# Patient Record
Sex: Male | Born: 1960
Health system: Southern US, Community
[De-identification: ages and names within clinical notes are randomized; demographics above are authoritative.]

## PROBLEM LIST (undated history)

## (undated) DIAGNOSIS — M479 Spondylosis, unspecified: Secondary | ICD-10-CM

## (undated) DIAGNOSIS — H7290 Unspecified perforation of tympanic membrane, unspecified ear: Secondary | ICD-10-CM

## (undated) DIAGNOSIS — R5383 Other fatigue: Secondary | ICD-10-CM

## (undated) DIAGNOSIS — M5030 Other cervical disc degeneration, unspecified cervical region: Secondary | ICD-10-CM

## (undated) DIAGNOSIS — K219 Gastro-esophageal reflux disease without esophagitis: Secondary | ICD-10-CM

## (undated) DIAGNOSIS — J309 Allergic rhinitis, unspecified: Secondary | ICD-10-CM

## (undated) DIAGNOSIS — H8109 Meniere's disease, unspecified ear: Secondary | ICD-10-CM

## (undated) DIAGNOSIS — R1011 Right upper quadrant pain: Secondary | ICD-10-CM

## (undated) DIAGNOSIS — E785 Hyperlipidemia, unspecified: Secondary | ICD-10-CM

## (undated) DIAGNOSIS — T7840XA Allergy, unspecified, initial encounter: Secondary | ICD-10-CM

## (undated) DIAGNOSIS — G4730 Sleep apnea, unspecified: Secondary | ICD-10-CM

## (undated) DIAGNOSIS — I959 Hypotension, unspecified: Secondary | ICD-10-CM

## (undated) DIAGNOSIS — E7800 Pure hypercholesterolemia, unspecified: Secondary | ICD-10-CM

## (undated) DIAGNOSIS — H6690 Otitis media, unspecified, unspecified ear: Secondary | ICD-10-CM

## (undated) DIAGNOSIS — Z9189 Other specified personal risk factors, not elsewhere classified: Secondary | ICD-10-CM

## (undated) DIAGNOSIS — R5381 Other malaise: Secondary | ICD-10-CM

## (undated) DIAGNOSIS — G4710 Hypersomnia, unspecified: Secondary | ICD-10-CM

## (undated) DIAGNOSIS — I10 Essential (primary) hypertension: Secondary | ICD-10-CM

## (undated) DIAGNOSIS — D376 Neoplasm of uncertain behavior of liver, gallbladder and bile ducts: Secondary | ICD-10-CM

## (undated) HISTORY — DX: Meniere's disease, unspecified ear: H81.09

## (undated) HISTORY — DX: Essential (primary) hypertension: I10

## (undated) HISTORY — DX: Hyperlipidemia, unspecified: E78.5

## (undated) HISTORY — DX: Spondylosis, unspecified: M47.9

## (undated) HISTORY — DX: Pure hypercholesterolemia, unspecified: E78.00

## (undated) HISTORY — DX: Allergy, unspecified, initial encounter: T78.40XA

## (undated) HISTORY — DX: Other cervical disc degeneration, unspecified cervical region: M50.30

## (undated) HISTORY — DX: Other specified personal risk factors, not elsewhere classified: Z91.89

## (undated) HISTORY — DX: Hypotension, unspecified: I95.9

## (undated) HISTORY — DX: Allergic rhinitis, unspecified: J30.9

## (undated) HISTORY — DX: Neoplasm of uncertain behavior of liver, gallbladder and bile ducts: D37.6

## (undated) HISTORY — DX: Other malaise: R53.81

## (undated) HISTORY — DX: Right upper quadrant pain: R10.11

## (undated) HISTORY — PX: OTHER SURGICAL HISTORY: SHX169

## (undated) HISTORY — DX: Sleep apnea, unspecified: G47.30

## (undated) HISTORY — DX: Gastro-esophageal reflux disease without esophagitis: K21.9

## (undated) HISTORY — DX: Otitis media, unspecified, unspecified ear: H66.90

## (undated) HISTORY — DX: Unspecified perforation of tympanic membrane, unspecified ear: H72.90

## (undated) HISTORY — DX: Hypersomnia, unspecified: G47.10

## (undated) HISTORY — PX: COLONOSCOPY: SHX174

## (undated) HISTORY — DX: Other fatigue: R53.83

---

## 2000-10-29 ENCOUNTER — Encounter: Payer: Self-pay | Admitting: Internal Medicine

## 2000-10-29 ENCOUNTER — Ambulatory Visit (HOSPITAL_COMMUNITY): Admission: RE | Admit: 2000-10-29 | Discharge: 2000-10-29 | Payer: Self-pay | Admitting: Internal Medicine

## 2005-01-10 ENCOUNTER — Ambulatory Visit: Payer: Self-pay | Admitting: Internal Medicine

## 2005-01-21 ENCOUNTER — Ambulatory Visit: Payer: Self-pay | Admitting: Internal Medicine

## 2005-04-19 ENCOUNTER — Ambulatory Visit: Payer: Self-pay | Admitting: Internal Medicine

## 2005-05-17 ENCOUNTER — Encounter: Admission: RE | Admit: 2005-05-17 | Discharge: 2005-05-17 | Payer: Self-pay | Admitting: Otolaryngology

## 2005-05-19 ENCOUNTER — Encounter: Admission: RE | Admit: 2005-05-19 | Discharge: 2005-05-19 | Payer: Self-pay | Admitting: Otolaryngology

## 2005-08-23 ENCOUNTER — Ambulatory Visit: Payer: Self-pay | Admitting: Internal Medicine

## 2006-12-23 ENCOUNTER — Encounter: Payer: Self-pay | Admitting: Internal Medicine

## 2006-12-23 DIAGNOSIS — Z9189 Other specified personal risk factors, not elsewhere classified: Secondary | ICD-10-CM | POA: Insufficient documentation

## 2006-12-23 DIAGNOSIS — H7290 Unspecified perforation of tympanic membrane, unspecified ear: Secondary | ICD-10-CM

## 2006-12-23 DIAGNOSIS — M479 Spondylosis, unspecified: Secondary | ICD-10-CM | POA: Insufficient documentation

## 2006-12-23 DIAGNOSIS — K219 Gastro-esophageal reflux disease without esophagitis: Secondary | ICD-10-CM | POA: Insufficient documentation

## 2006-12-23 DIAGNOSIS — M5030 Other cervical disc degeneration, unspecified cervical region: Secondary | ICD-10-CM

## 2006-12-23 DIAGNOSIS — H8109 Meniere's disease, unspecified ear: Secondary | ICD-10-CM | POA: Insufficient documentation

## 2006-12-23 DIAGNOSIS — E7800 Pure hypercholesterolemia, unspecified: Secondary | ICD-10-CM | POA: Insufficient documentation

## 2006-12-23 DIAGNOSIS — M47812 Spondylosis without myelopathy or radiculopathy, cervical region: Secondary | ICD-10-CM | POA: Insufficient documentation

## 2006-12-23 HISTORY — DX: Other specified personal risk factors, not elsewhere classified: Z91.89

## 2006-12-23 HISTORY — DX: Unspecified perforation of tympanic membrane, unspecified ear: H72.90

## 2006-12-23 HISTORY — DX: Other cervical disc degeneration, unspecified cervical region: M50.30

## 2006-12-23 HISTORY — DX: Spondylosis, unspecified: M47.9

## 2006-12-23 HISTORY — DX: Pure hypercholesterolemia, unspecified: E78.00

## 2006-12-23 HISTORY — DX: Gastro-esophageal reflux disease without esophagitis: K21.9

## 2006-12-23 HISTORY — DX: Meniere's disease, unspecified ear: H81.09

## 2007-03-17 ENCOUNTER — Ambulatory Visit: Payer: Self-pay | Admitting: Internal Medicine

## 2007-03-17 LAB — CONVERTED CEMR LAB
ALT: 23 units/L (ref 0–53)
AST: 20 units/L (ref 0–37)
Albumin: 3.8 g/dL (ref 3.5–5.2)
Alkaline Phosphatase: 53 units/L (ref 39–117)
BUN: 9 mg/dL (ref 6–23)
Basophils Absolute: 0 10*3/uL (ref 0.0–0.1)
Basophils Relative: 0.2 % (ref 0.0–1.0)
Bilirubin Urine: NEGATIVE
Bilirubin, Direct: 0.1 mg/dL (ref 0.0–0.3)
CO2: 27 meq/L (ref 19–32)
Calcium: 9.3 mg/dL (ref 8.4–10.5)
Chloride: 104 meq/L (ref 96–112)
Cholesterol: 209 mg/dL (ref 0–200)
Creatinine, Ser: 1 mg/dL (ref 0.4–1.5)
Direct LDL: 121 mg/dL
Eosinophils Absolute: 0.2 10*3/uL (ref 0.0–0.6)
Eosinophils Relative: 3.1 % (ref 0.0–5.0)
GFR calc Af Amer: 104 mL/min
GFR calc non Af Amer: 86 mL/min
Glucose, Bld: 87 mg/dL (ref 70–99)
HCT: 48.8 % (ref 39.0–52.0)
HDL: 52.9 mg/dL (ref >39.0)
Hemoglobin, Urine: NEGATIVE
Hemoglobin: 17.1 g/dL — ABNORMAL HIGH (ref 13.0–17.0)
Ketones, ur: NEGATIVE mg/dL
Leukocytes, UA: NEGATIVE
Lymphocytes Relative: 34.5 % (ref 12.0–46.0)
MCHC: 35.1 g/dL (ref 30.0–36.0)
MCV: 90.6 fL (ref 78.0–100.0)
Monocytes Absolute: 0.7 10*3/uL (ref 0.2–0.7)
Monocytes Relative: 11.7 % — ABNORMAL HIGH (ref 3.0–11.0)
Neutro Abs: 3.2 10*3/uL (ref 1.4–7.7)
Neutrophils Relative %: 50.5 % (ref 43.0–77.0)
Nitrite: NEGATIVE
PSA: 0.92 ng/mL (ref 0.10–4.00)
Platelets: 213 10*3/uL (ref 150–400)
Potassium: 3.9 meq/L (ref 3.5–5.1)
RBC: 5.38 M/uL (ref 4.22–5.81)
RDW: 12.7 % (ref 11.5–14.6)
Sodium: 136 meq/L (ref 135–145)
Specific Gravity, Urine: 1.02 (ref 1.000–1.03)
TSH: 3.4 microintl units/mL (ref 0.35–5.50)
Total Bilirubin: 1.1 mg/dL (ref 0.3–1.2)
Total CHOL/HDL Ratio: 4
Total Protein, Urine: NEGATIVE mg/dL
Total Protein: 7.1 g/dL (ref 6.0–8.3)
Triglycerides: 166 mg/dL — ABNORMAL HIGH (ref 0–149)
Urine Glucose: NEGATIVE mg/dL
Urobilinogen, UA: 0.2 (ref 0.0–1.0)
VLDL: 33 mg/dL (ref 0–40)
WBC: 6.3 10*3/uL (ref 4.5–10.5)
pH: 6 (ref 5.0–8.0)

## 2007-03-19 ENCOUNTER — Ambulatory Visit: Payer: Self-pay | Admitting: Internal Medicine

## 2007-03-19 DIAGNOSIS — I10 Essential (primary) hypertension: Secondary | ICD-10-CM

## 2007-03-19 DIAGNOSIS — J309 Allergic rhinitis, unspecified: Secondary | ICD-10-CM

## 2007-03-19 DIAGNOSIS — J302 Other seasonal allergic rhinitis: Secondary | ICD-10-CM | POA: Insufficient documentation

## 2007-03-19 DIAGNOSIS — E785 Hyperlipidemia, unspecified: Secondary | ICD-10-CM | POA: Insufficient documentation

## 2007-03-19 HISTORY — DX: Essential (primary) hypertension: I10

## 2007-03-19 HISTORY — DX: Hyperlipidemia, unspecified: E78.5

## 2007-03-19 HISTORY — DX: Allergic rhinitis, unspecified: J30.9

## 2007-09-30 ENCOUNTER — Telehealth (INDEPENDENT_AMBULATORY_CARE_PROVIDER_SITE_OTHER): Payer: Self-pay | Admitting: *Deleted

## 2007-12-09 ENCOUNTER — Ambulatory Visit: Payer: Self-pay | Admitting: Internal Medicine

## 2007-12-09 DIAGNOSIS — R1011 Right upper quadrant pain: Secondary | ICD-10-CM | POA: Insufficient documentation

## 2007-12-09 HISTORY — DX: Right upper quadrant pain: R10.11

## 2007-12-09 LAB — CONVERTED CEMR LAB
ALT: 20 units/L (ref 0–53)
AST: 17 units/L (ref 0–37)
Albumin: 3.9 g/dL (ref 3.5–5.2)
Alkaline Phosphatase: 53 units/L (ref 39–117)
BUN: 14 mg/dL (ref 6–23)
Basophils Absolute: 0.1 10*3/uL (ref 0.0–0.1)
Basophils Relative: 1.1 % (ref 0.0–3.0)
Bilirubin, Direct: 0.1 mg/dL (ref 0.0–0.3)
CO2: 29 meq/L (ref 19–32)
Calcium: 9.4 mg/dL (ref 8.4–10.5)
Chloride: 98 meq/L (ref 96–112)
Creatinine, Ser: 1 mg/dL (ref 0.4–1.5)
Eosinophils Absolute: 0.2 10*3/uL (ref 0.0–0.7)
Eosinophils Relative: 2.5 % (ref 0.0–5.0)
GFR calc Af Amer: 104 mL/min
GFR calc non Af Amer: 86 mL/min
Glucose, Bld: 103 mg/dL — ABNORMAL HIGH (ref 70–99)
HCT: 48.1 % (ref 39.0–52.0)
Hemoglobin: 16.7 g/dL (ref 13.0–17.0)
Lipase: 28 units/L (ref 11.0–59.0)
Lymphocytes Relative: 30.6 % (ref 12.0–46.0)
MCHC: 34.6 g/dL (ref 30.0–36.0)
MCV: 90.4 fL (ref 78.0–100.0)
Monocytes Absolute: 0.7 10*3/uL (ref 0.1–1.0)
Monocytes Relative: 10.7 % (ref 3.0–12.0)
Neutro Abs: 3.7 10*3/uL (ref 1.4–7.7)
Neutrophils Relative %: 55.1 % (ref 43.0–77.0)
Platelets: 255 10*3/uL (ref 150–400)
Potassium: 4.6 meq/L (ref 3.5–5.1)
RBC: 5.32 M/uL (ref 4.22–5.81)
RDW: 12.5 % (ref 11.5–14.6)
Sodium: 136 meq/L (ref 135–145)
Total Bilirubin: 0.8 mg/dL (ref 0.3–1.2)
Total Protein: 7.4 g/dL (ref 6.0–8.3)
WBC: 6.8 10*3/uL (ref 4.5–10.5)

## 2007-12-11 ENCOUNTER — Encounter: Payer: Self-pay | Admitting: Internal Medicine

## 2007-12-11 ENCOUNTER — Encounter: Admission: RE | Admit: 2007-12-11 | Discharge: 2007-12-11 | Payer: Self-pay | Admitting: Internal Medicine

## 2007-12-11 DIAGNOSIS — D376 Neoplasm of uncertain behavior of liver, gallbladder and bile ducts: Secondary | ICD-10-CM | POA: Insufficient documentation

## 2007-12-11 HISTORY — DX: Neoplasm of uncertain behavior of liver, gallbladder and bile ducts: D37.6

## 2007-12-16 ENCOUNTER — Ambulatory Visit: Payer: Self-pay | Admitting: Cardiology

## 2008-04-13 ENCOUNTER — Telehealth (INDEPENDENT_AMBULATORY_CARE_PROVIDER_SITE_OTHER): Payer: Self-pay | Admitting: *Deleted

## 2008-10-20 ENCOUNTER — Telehealth: Payer: Self-pay | Admitting: Internal Medicine

## 2008-11-18 ENCOUNTER — Telehealth: Payer: Self-pay | Admitting: Internal Medicine

## 2009-01-18 ENCOUNTER — Telehealth: Payer: Self-pay | Admitting: Internal Medicine

## 2009-04-13 ENCOUNTER — Ambulatory Visit: Payer: Self-pay | Admitting: Internal Medicine

## 2009-04-13 DIAGNOSIS — H6690 Otitis media, unspecified, unspecified ear: Secondary | ICD-10-CM

## 2009-04-13 DIAGNOSIS — I959 Hypotension, unspecified: Secondary | ICD-10-CM

## 2009-04-13 HISTORY — DX: Otitis media, unspecified, unspecified ear: H66.90

## 2009-04-13 HISTORY — DX: Hypotension, unspecified: I95.9

## 2009-04-14 LAB — CONVERTED CEMR LAB
Alkaline Phosphatase: 52 units/L (ref 39–117)
BUN: 13 mg/dL (ref 6–23)
Basophils Absolute: 0 10*3/uL (ref 0.0–0.1)
Basophils Relative: 0.3 % (ref 0.0–3.0)
Bilirubin, Direct: 0.1 mg/dL (ref 0.0–0.3)
Chloride: 101 meq/L (ref 96–112)
Cholesterol: 169 mg/dL (ref 0–200)
Eosinophils Absolute: 0 10*3/uL (ref 0.0–0.7)
Glucose, Bld: 124 mg/dL — ABNORMAL HIGH (ref 70–99)
Hgb A1c MFr Bld: 5.3 % (ref 4.6–6.5)
LDL Cholesterol: 99 mg/dL (ref 0–99)
Lymphocytes Relative: 5.8 % — ABNORMAL LOW (ref 12.0–46.0)
MCHC: 34 g/dL (ref 30.0–36.0)
Neutrophils Relative %: 84.4 % — ABNORMAL HIGH (ref 43.0–77.0)
Nitrite: NEGATIVE
Potassium: 3.9 meq/L (ref 3.5–5.1)
RBC: 4.63 M/uL (ref 4.22–5.81)
Specific Gravity, Urine: 1.025 (ref 1.000–1.030)
Total Bilirubin: 0.8 mg/dL (ref 0.3–1.2)
Total Protein, Urine: 30 mg/dL
Total Protein: 7.4 g/dL (ref 6.0–8.3)
VLDL: 13.8 mg/dL (ref 0.0–40.0)
WBC: 4.5 10*3/uL (ref 4.5–10.5)
pH: 6 (ref 5.0–8.0)

## 2009-04-19 ENCOUNTER — Telehealth: Payer: Self-pay | Admitting: Internal Medicine

## 2009-04-28 ENCOUNTER — Ambulatory Visit: Payer: Self-pay | Admitting: Internal Medicine

## 2009-04-28 DIAGNOSIS — R5383 Other fatigue: Secondary | ICD-10-CM

## 2009-04-28 DIAGNOSIS — G4710 Hypersomnia, unspecified: Secondary | ICD-10-CM

## 2009-04-28 DIAGNOSIS — R5381 Other malaise: Secondary | ICD-10-CM | POA: Insufficient documentation

## 2009-04-28 HISTORY — DX: Hypersomnia, unspecified: G47.10

## 2009-04-28 HISTORY — DX: Other malaise: R53.81

## 2009-05-03 ENCOUNTER — Telehealth: Payer: Self-pay | Admitting: Internal Medicine

## 2009-10-20 ENCOUNTER — Telehealth: Payer: Self-pay | Admitting: Internal Medicine

## 2010-02-16 ENCOUNTER — Telehealth (INDEPENDENT_AMBULATORY_CARE_PROVIDER_SITE_OTHER): Payer: Self-pay | Admitting: *Deleted
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
## 2010-04-10 NOTE — Progress Notes (Signed)
----   Converted from flag ---- ---- 04/29/2009 9:55 PM, Corwin Levins MD wrote: please contact pt;  I am not sure if he is still taking the micardis or not;  just need to check to see if he is taking it at his last visit  thanks ------------------------------  Called pt and he stated he is taking the Micardis      New/Updated Medications: MICARDIS 80 MG TABS (TELMISARTAN) 1 by mouth once daily  noted; this was apparently an error when micardis removed at last office visit  will place back on list  Corwin Levins MD  May 03, 2009 12:44 PM

## 2010-04-10 NOTE — Progress Notes (Signed)
Summary: Medication refill  Phone Note Refill Request Message from:  Fax from Pharmacy on October 20, 2009 4:12 PM  Refills Requested: Medication #1:  AMBIEN 10 MG  TABS 1 by mouth at bedtime as needed   Dosage confirmed as above?Dosage Confirmed   Last Refilled: 04/19/2009   Notes: [facility] [address], [phone] Initial call taken by: Robin Ewing CMA Duncan Dull),  October 20, 2009 4:12 PM    Prescriptions: AMBIEN 10 MG  TABS (ZOLPIDEM TARTRATE) 1 by mouth at bedtime as needed  #30 x 5   Entered and Authorized by:   Corwin Levins MD   Signed by:   Corwin Levins MD on 10/20/2009   Method used:   Print then Give to Patient   RxID:   [id number]  done hardcopy to [facility] side B - dahlia  Corwin Levins MD  October 20, 2009 4:14 PM   Rx faxed to pharmacy Margaret Pyle, CMA  October 20, 2009 4:16 PM

## 2010-04-10 NOTE — Progress Notes (Signed)
Summary: Med Refill  Phone Note Refill Request  on April 19, 2009 11:45 AM  Refills Requested: Medication #1:  AMBIEN 10 MG  TABS 1 by mouth at bedtime as needed  -  needs return office visit for further refills   Dosage confirmed as above?Dosage Confirmed   Last Refilled: 01/2009   Notes: [facility] [address], [phone] Initial call taken by: Scharlene Gloss,  April 19, 2009 11:46 AM  Follow-up for Phone Call        done hardcopy to [facility] side B - dahlia  Follow-up by: Corwin Levins MD,  April 19, 2009 12:42 PM  Additional Follow-up for Phone Call Additional follow up Details #1::        rx faxed to pharmacy Additional Follow-up by: Margaret Pyle, CMA,  April 19, 2009 12:52 PM    New/Updated Medications: AMBIEN 10 MG  TABS (ZOLPIDEM TARTRATE) 1 by mouth at bedtime as needed Prescriptions: AMBIEN 10 MG  TABS (ZOLPIDEM TARTRATE) 1 by mouth at bedtime as needed  #30 x 5   Entered and Authorized by:   Corwin Levins MD   Signed by:   Corwin Levins MD on 04/19/2009   Method used:   Print then Give to Patient   RxID:   [id number]
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
## 2010-04-10 NOTE — Progress Notes (Signed)
  Phone Note Other Incoming   Request: Send information Summary of Call: Request for records received from [facility]. Request forwarded to [facility].     

## 2010-04-12 ENCOUNTER — Encounter: Payer: Self-pay | Admitting: Internal Medicine

## 2010-04-18 NOTE — Letter (Signed)
Summary: Generic Letter  [facility]  [address] [place], [place] [address]   Phone: [phone]  Fax: [phone]    04/12/2010  Brad Ray [address] [place], [place]  [address]  Dear Mr. Sedlak,       This letter is in response to a written request for  my medical opinion regarding the need for further evaluation  of a nonspecific lesion to the liver found on CT scan   December 16, 2007.  As was documented by the radiologist,  although the lesion on CT scan was nonspecific, statistically  this most likely represented a hemangioma, which is benign.  As the patient had no history of malignancy, I felt no further  evaluation was needed.          Sincerely,   Oliver Barre MD

## 2010-05-01 ENCOUNTER — Telehealth: Payer: Self-pay | Admitting: Internal Medicine

## 2010-05-08 NOTE — Progress Notes (Signed)
Summary: medication refil  Phone Note Refill Request Message from:  Fax from Pharmacy on May 01, 2010 9:18 AM  Refills Requested: Medication #1:  AMBIEN 10 MG  TABS 1 by mouth at bedtime as needed   Dosage confirmed as above?Dosage Confirmed   Last Refilled: 10/20/2009   Notes: [facility] [address], [phone] Initial call taken by: Robin Ewing CMA Duncan Dull),  May 01, 2010 9:19 AM    Prescriptions: AMBIEN 10 MG  TABS (ZOLPIDEM TARTRATE) 1 by mouth at bedtime as needed  #30 x 5   Entered and Authorized by:   Corwin Levins MD   Signed by:   Corwin Levins MD on 05/01/2010   Method used:   Print then Give to Patient   RxID:   [id number]  done hardcopy to [facility] side B - dahlia Corwin Levins MD  May 01, 2010 1:15 PM   Rx faxed to pharmacy Margaret Pyle, CMA  May 01, 2010 1:24 PM

## 2010-06-01 ENCOUNTER — Other Ambulatory Visit: Payer: Self-pay | Admitting: Internal Medicine

## 2010-07-30 ENCOUNTER — Other Ambulatory Visit: Payer: Self-pay | Admitting: Internal Medicine

## 2010-08-28 ENCOUNTER — Other Ambulatory Visit: Payer: Self-pay | Admitting: Internal Medicine

## 2010-08-28 ENCOUNTER — Other Ambulatory Visit: Payer: Self-pay

## 2010-08-28 MED ORDER — TESTOSTERONE 50 MG/5GM (1%) TD GEL: 5.0000 g | Freq: Every day | TRANSDERMAL | Status: DC

## 2010-08-28 NOTE — Telephone Encounter (Signed)
Faxed hardcopy to pharmacy. 

## 2010-08-29 ENCOUNTER — Telehealth: Payer: Self-pay

## 2010-08-29 ENCOUNTER — Other Ambulatory Visit (INDEPENDENT_AMBULATORY_CARE_PROVIDER_SITE_OTHER): Payer: BC Managed Care – PPO

## 2010-08-29 DIAGNOSIS — Z0000 Encounter for general adult medical examination without abnormal findings: Secondary | ICD-10-CM

## 2010-08-29 DIAGNOSIS — Z1289 Encounter for screening for malignant neoplasm of other sites: Secondary | ICD-10-CM

## 2010-08-29 LAB — BASIC METABOLIC PANEL
CO2: 29 mEq/L (ref 19–32)
Calcium: 9 mg/dL (ref 8.4–10.5)
Creatinine, Ser: 1 mg/dL (ref 0.4–1.5)

## 2010-08-29 LAB — URINALYSIS
Hgb urine dipstick: NEGATIVE
Urine Glucose: NEGATIVE
Urobilinogen, UA: 0.2 (ref 0.0–1.0)

## 2010-08-29 LAB — CBC WITH DIFFERENTIAL/PLATELET
Basophils Absolute: 0 10*3/uL (ref 0.0–0.1)
Basophils Relative: 0.4 % (ref 0.0–3.0)
Eosinophils Absolute: 0.1 10*3/uL (ref 0.0–0.7)
MCHC: 34.7 g/dL (ref 30.0–36.0)
MCV: 90.4 fl (ref 78.0–100.0)
Monocytes Absolute: 0.4 10*3/uL (ref 0.1–1.0)
Neutrophils Relative %: 63.8 % (ref 43.0–77.0)
Platelets: 212 10*3/uL (ref 150.0–400.0)
RDW: 13.6 % (ref 11.5–14.6)

## 2010-08-29 LAB — PSA: PSA: 0.85 ng/mL (ref 0.10–4.00)

## 2010-08-29 LAB — TSH: TSH: 3.69 u[IU]/mL (ref 0.35–5.50)

## 2010-08-29 LAB — HEPATIC FUNCTION PANEL
ALT: 21 U/L (ref 0–53)
AST: 20 U/L (ref 0–37)
Bilirubin, Direct: 0.1 mg/dL (ref 0.0–0.3)
Total Bilirubin: 0.7 mg/dL (ref 0.3–1.2)

## 2010-08-29 LAB — LIPID PANEL: Triglycerides: 169 mg/dL — ABNORMAL HIGH (ref 0.0–149.0)

## 2010-08-29 NOTE — Telephone Encounter (Signed)
Received approval for Androgel 1 %, effective date 08/29/2010 - 3/16/ 2015.

## 2010-08-29 NOTE — Telephone Encounter (Signed)
Started prior authorization for Androgel 1% gel packet, called [phone]. They faxed a form for the MD to complete and fax back to [phone] for them to process. Awaiting response.

## 2010-09-09 ENCOUNTER — Encounter: Payer: Self-pay | Admitting: Internal Medicine

## 2010-09-09 DIAGNOSIS — Z7189 Other specified counseling: Secondary | ICD-10-CM | POA: Insufficient documentation

## 2010-09-09 DIAGNOSIS — Z0000 Encounter for general adult medical examination without abnormal findings: Secondary | ICD-10-CM | POA: Insufficient documentation

## 2010-09-10 ENCOUNTER — Encounter: Payer: Self-pay | Admitting: Internal Medicine

## 2010-09-10 ENCOUNTER — Ambulatory Visit (INDEPENDENT_AMBULATORY_CARE_PROVIDER_SITE_OTHER): Payer: BC Managed Care – PPO | Admitting: Internal Medicine

## 2010-09-10 VITALS — BP 122/78 | HR 64 | Temp 97.7°F | Resp 16 | Ht 75.0 in | Wt 247.5 lb

## 2010-09-10 DIAGNOSIS — Z0000 Encounter for general adult medical examination without abnormal findings: Secondary | ICD-10-CM

## 2010-09-10 DIAGNOSIS — Z136 Encounter for screening for cardiovascular disorders: Secondary | ICD-10-CM

## 2010-09-10 NOTE — Patient Instructions (Signed)
Continue all other medications as before Please return in 1 year for your yearly visit, or sooner if needed, with Lab testing done 3-5 days before  
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
## 2010-09-10 NOTE — Assessment & Plan Note (Signed)

## 2010-10-08 ENCOUNTER — Other Ambulatory Visit: Payer: Self-pay | Admitting: Internal Medicine

## 2010-11-08 ENCOUNTER — Other Ambulatory Visit: Payer: Self-pay

## 2010-11-08 MED ORDER — ZOLPIDEM TARTRATE 10 MG PO TABS: 10.0000 mg | ORAL_TABLET | Freq: Every evening | ORAL | Status: DC | PRN

## 2010-11-09 NOTE — Telephone Encounter (Signed)
Faxed hardcopy to [facility] [address] [place] [phone]

## 2010-12-10 ENCOUNTER — Other Ambulatory Visit: Payer: Self-pay | Admitting: Internal Medicine

## 2011-02-25 ENCOUNTER — Other Ambulatory Visit: Payer: Self-pay

## 2011-02-25 MED ORDER — TESTOSTERONE 50 MG/5GM (1%) TD GEL: 5.0000 g | Freq: Every day | TRANSDERMAL | Status: DC

## 2011-02-25 NOTE — Telephone Encounter (Signed)
Faxed hardcopy to pharmacy. 

## 2011-04-08 ENCOUNTER — Other Ambulatory Visit: Payer: Self-pay

## 2011-04-08 MED ORDER — ZOLPIDEM TARTRATE 10 MG PO TABS: 10.0000 mg | ORAL_TABLET | Freq: Every evening | ORAL | Status: DC | PRN

## 2011-04-08 NOTE — Telephone Encounter (Signed)
Faxed hardcopy to pharmacy. 

## 2011-09-09 ENCOUNTER — Other Ambulatory Visit: Payer: Self-pay

## 2011-09-09 MED ORDER — TESTOSTERONE 50 MG/5GM (1%) TD GEL: 5.0000 g | Freq: Every day | TRANSDERMAL | Status: DC

## 2011-09-09 NOTE — Telephone Encounter (Signed)
Done hardcopy to robin  

## 2011-09-09 NOTE — Telephone Encounter (Signed)
Faxed hardcopy to pharmacy. 

## 2011-10-09 ENCOUNTER — Other Ambulatory Visit: Payer: Self-pay

## 2011-10-09 MED ORDER — ZOLPIDEM TARTRATE 10 MG PO TABS: 10.0000 mg | ORAL_TABLET | Freq: Every evening | ORAL | Status: DC | PRN

## 2011-10-09 NOTE — Telephone Encounter (Signed)
Faxed hardcopy to pharmacy. 

## 2011-11-04 ENCOUNTER — Other Ambulatory Visit: Payer: Self-pay | Admitting: Internal Medicine

## 2011-11-08 ENCOUNTER — Other Ambulatory Visit: Payer: Self-pay

## 2011-11-08 MED ORDER — ZOLPIDEM TARTRATE 10 MG PO TABS: 10.0000 mg | ORAL_TABLET | Freq: Every evening | ORAL | Status: DC | PRN

## 2011-11-08 NOTE — Telephone Encounter (Signed)
Faxed hardcopy to pharmacy. 

## 2011-11-08 NOTE — Telephone Encounter (Signed)
Done hardcopy to robin  

## 2011-12-17 ENCOUNTER — Other Ambulatory Visit: Payer: Self-pay | Admitting: Internal Medicine

## 2011-12-17 NOTE — Telephone Encounter (Signed)
Faxed hardcopy to pharmacy and called the patient left detailed message to schedule ROV

## 2011-12-17 NOTE — Telephone Encounter (Signed)
Done hardcopy to dahlia/LIM B   LOV July 2012;  Needs ROV for further refills

## 2012-01-13 ENCOUNTER — Other Ambulatory Visit: Payer: Self-pay | Admitting: Internal Medicine

## 2012-01-13 ENCOUNTER — Telehealth: Payer: Self-pay

## 2012-01-13 MED ORDER — TESTOSTERONE 50 MG/5GM (1%) TD GEL: 5.0000 g | Freq: Every day | TRANSDERMAL | Status: DC

## 2012-01-13 MED ORDER — TELMISARTAN 80 MG PO TABS: 80.0000 mg | ORAL_TABLET | Freq: Every day | ORAL | Status: DC

## 2012-01-13 NOTE — Telephone Encounter (Signed)
Ok - to robin to handle 

## 2012-01-13 NOTE — Telephone Encounter (Signed)
Refills done.  Patient is requesting Ambien also.

## 2012-01-13 NOTE — Telephone Encounter (Signed)
The patient called to inform he has scheduled appt. Jan. 29, 2014 (stated was the earliest we had) and would like his micardis, androgel and [place] refilled.  Please Francee Piccolo

## 2012-01-13 NOTE — Telephone Encounter (Signed)
Done hardcopy to robin  

## 2012-01-14 MED ORDER — ZOLPIDEM TARTRATE 10 MG PO TABS: 10.0000 mg | ORAL_TABLET | Freq: Every evening | ORAL | Status: DC | PRN

## 2012-01-14 NOTE — Telephone Encounter (Signed)
Faxed [employer] [employer] to pharmacy

## 2012-03-13 ENCOUNTER — Encounter: Payer: Self-pay | Admitting: Internal Medicine

## 2012-03-13 ENCOUNTER — Ambulatory Visit (INDEPENDENT_AMBULATORY_CARE_PROVIDER_SITE_OTHER): Payer: BC Managed Care – PPO | Admitting: Internal Medicine

## 2012-03-13 VITALS — BP 102/70 | HR 73 | Temp 97.7°F | Ht 76.0 in | Wt 246.4 lb

## 2012-03-13 DIAGNOSIS — H109 Unspecified conjunctivitis: Secondary | ICD-10-CM | POA: Insufficient documentation

## 2012-03-13 DIAGNOSIS — Z23 Encounter for immunization: Secondary | ICD-10-CM

## 2012-03-13 DIAGNOSIS — Z0000 Encounter for general adult medical examination without abnormal findings: Secondary | ICD-10-CM

## 2012-03-13 DIAGNOSIS — I10 Essential (primary) hypertension: Secondary | ICD-10-CM

## 2012-03-13 DIAGNOSIS — E291 Testicular hypofunction: Secondary | ICD-10-CM | POA: Insufficient documentation

## 2012-03-13 DIAGNOSIS — J069 Acute upper respiratory infection, unspecified: Secondary | ICD-10-CM | POA: Insufficient documentation

## 2012-03-13 MED ORDER — ZOLPIDEM TARTRATE 10 MG PO TABS: 10.0000 mg | ORAL_TABLET | Freq: Every evening | ORAL | Status: DC | PRN

## 2012-03-13 MED ORDER — TOBRAMYCIN 0.3 % OP SOLN: 1.0000 [drp] | Freq: Four times a day (QID) | OPHTHALMIC | Status: DC

## 2012-03-13 MED ORDER — TELMISARTAN 80 MG PO TABS: 80.0000 mg | ORAL_TABLET | Freq: Every day | ORAL | Status: DC

## 2012-03-13 MED ORDER — TESTOSTERONE 50 MG/5GM (1%) TD GEL: 5.0000 g | Freq: Every day | TRANSDERMAL | Status: DC

## 2012-03-13 MED ORDER — LEVOFLOXACIN 500 MG PO TABS: 500.0000 mg | ORAL_TABLET | Freq: Every day | ORAL | Status: DC

## 2012-03-13 NOTE — Assessment & Plan Note (Signed)
stable overall by hx and exam, most recent data reviewed with pt, and pt to continue medical treatment as before BP Readings from Last 3 Encounters:  03/13/12 102/70  09/10/10 122/78  04/28/09 128/80

## 2012-03-13 NOTE — Assessment & Plan Note (Signed)
Mild to mod, for antibx course,  to f/u any worsening symptoms or concerns 

## 2012-03-13 NOTE — Assessment & Plan Note (Signed)
For f/u testosterone level, Continue all  medications as before

## 2012-03-13 NOTE — Progress Notes (Signed)
Subjective:    Patient ID: SILVER PARKEY, male    DOB: 29-Jun-1960, 52 y.o.   MRN: [id number]  HPI  Here for wellness and f/u;  Overall doing ok;  Pt denies CP, worsening SOB, DOE, wheezing, orthopnea, PND, worsening LE edema, palpitations, dizziness or syncope.  Pt denies neurological change such as new Headache, facial or extremity weakness.  Pt denies polydipsia, polyuria, or low sugar symptoms. Pt states overall good compliance with treatment and medications, good tolerability, and trying to follow lower cholesterol diet.  Pt denies worsening depressive symptoms, suicidal ideation or panic. No fever, wt loss, night sweats, loss of appetite, or other constitutional symptoms.  Pt states good ability with ADL's, low fall risk, home safety reviewed and adequate, no significant changes in hearing or vision, and occasionally active with exercise. Incidentally with 2 days onset severe ST with large tonsil, fever, and bilat conjunct erythema, discomfort but no blurred vision, HA, tongue swelling. Past Medical History  Diagnosis Date  . Abdominal pain, right upper quadrant 12/09/2007  . ALLERGIC RHINITIS 03/19/2007  . DEGENERATIVE JOINT DISEASE, LUMBAR SPINE 12/23/2006  . DISC DISEASE, CERVICAL 12/23/2006  . FATIGUE 04/28/2009  . GERD 12/23/2006  . HYPERCHOLESTEROLEMIA 12/23/2006  . HYPERLIPIDEMIA 03/19/2007  . HYPERSOMNIA 04/28/2009  . HYPERTENSION 03/19/2007  . HYPOTENSION 04/13/2009  . INSOMNIA, HX OF 12/23/2006  . LIVER MASS 12/11/2007  . MENIERE'S DISEASE 12/23/2006  . OTITIS MEDIA, LEFT 04/13/2009  . PERFORATION, TYMPANIC MEMBRANE NOS 12/23/2006   Past Surgical History  Procedure Date  . Left knee (mcl) repair   . S/p left ear surgury for vertigo   . S/p left knee medial collateral ligament damage     reports that he has never smoked. He does not have any smokeless tobacco history on file. He reports that he drinks alcohol. His drug history not on file. family history includes Alcohol abuse in his  other; Diabetes in his father; and Hypertension in his father. Allergies  Allergen Reactions  . Iohexol      Desc: PT sneezed twice during injection of Omni 300. No distress. Not treated. Observed by Dr Myrtis Ser., Onset Date: [id number]   . Penicillins    Current Outpatient Prescriptions on File Prior to Visit  Medication Sig Dispense Refill  . Multiple Vitamins-Minerals (CENTRUM SILVER PO) Take 1 each by mouth daily.        Marland Kitchen telmisartan (MICARDIS) 80 MG tablet Take 1 tablet (80 mg total) by mouth daily.  90 tablet  3  . testosterone (ANDROGEL) 50 MG/5GM GEL Place 5 g onto the skin daily.  90 Package  1  . vitamin B-12 (CYANOCOBALAMIN) 500 MCG tablet Take 500 mcg by mouth daily.        . vitamin C (ASCORBIC ACID) 500 MG tablet Take 1,000 mg by mouth daily.        . vitamin E 200 UNIT capsule Take 200 Units by mouth daily.        Marland Kitchen zolpidem (AMBIEN) 10 MG tablet Take 1 tablet (10 mg total) by mouth at bedtime as needed for sleep.  90 tablet  1   Review of Systems Review of Systems  Constitutional: Negative for diaphoresis, activity change, appetite change and unexpected weight change.  HENT: Negative for hearing loss, ear pain, facial swelling, mouth sores and neck stiffness.   Eyes: Negative for pain, redness and visual disturbance.  Respiratory: Negative for shortness of breath and wheezing.   Cardiovascular: Negative for chest pain and palpitations.  Gastrointestinal: Negative  for diarrhea, blood in stool, abdominal distention and rectal pain.  Genitourinary: Negative for hematuria, flank pain and decreased urine volume.  Musculoskeletal: Negative for myalgias and joint swelling.  Skin: Negative for color change and wound.  Neurological: Negative for syncope and numbness.  Hematological: Negative for adenopathy.  Psychiatric/Behavioral: Negative for hallucinations, self-injury, decreased concentration and agitation.      Objective:   Physical Exam BP 102/70  Pulse 73  Temp 97.7 F  (36.5 C) (Oral)  Ht 6\' 4"  (1.93 m)  Wt 246 lb 6 oz (111.755 kg)  BMI 29.99 kg/m2  SpO2 96% Physical Exam  VS noted, mild ill Constitutional: Pt is oriented to person, place, and time. Appears well-developed and well-nourished.  Head: Normocephalic and atraumatic.  Right Ear: External ear normal.  Left Ear: External ear normal.  Nose: Nose normal.  Bilat tm's mild erythema.  Sinus nontender.  Pharynx mild erythema Mouth/Throat: marked erythema with tonsil 2+ with exudate, with bilat submandib LA tender Eyes: Conjunctivae with bilat erythema. Pupils are equal, round, and reactive to light.  Neck: Normal range of motion. Neck supple. No JVD present. No tracheal deviation present.  Cardiovascular: Normal rate, regular rhythm, normal heart sounds and intact distal pulses.   Pulmonary/Chest: Effort normal and breath sounds normal.  Abdominal: Soft. Bowel sounds are normal. There is no tenderness.  Musculoskeletal: Normal range of motion. Exhibits no edema.  Lymphadenopathy:  Has no cervical adenopathy.  Neurological: Pt is alert and oriented to person, place, and time. Pt has normal reflexes. No cranial nerve deficit.  Skin: Skin is warm and dry. No rash noted.  Psychiatric:  Has  normal mood and affect. Behavior is normal.     Assessment & Plan:

## 2012-03-13 NOTE — Patient Instructions (Addendum)
Your EKG was OK today You had the flu shot today Take all new medications as prescribed  - the antibiotic (levaquin) and eye drops Continue all other medications as before Your refills were all done today Please go to LAB in the Basement for the blood and/or urine tests to be done on Monday Jan 6 You will be contacted by phone if any changes need to be made immediately.  Otherwise, you will receive a letter about your results with an explanation, but please check with MyChart first. Thank you for enrolling in MyChart. Please follow the instructions below to securely access your online medical record. MyChart allows you to send messages to your doctor, view your test results, renew your prescriptions, schedule appointments, and more. To Log into MyChart, please go to [email or web address], and your Username is: [email or web address] OK to cancel your appt in 2 wks (I will arrange) Please return in 1 year for your yearly visit, or sooner if needed, with Lab testing done 3-5 days before

## 2012-03-13 NOTE — Assessment & Plan Note (Signed)

## 2012-03-16 ENCOUNTER — Other Ambulatory Visit (INDEPENDENT_AMBULATORY_CARE_PROVIDER_SITE_OTHER): Payer: BC Managed Care – PPO

## 2012-03-16 DIAGNOSIS — Z0000 Encounter for general adult medical examination without abnormal findings: Secondary | ICD-10-CM

## 2012-03-16 DIAGNOSIS — E291 Testicular hypofunction: Secondary | ICD-10-CM

## 2012-03-16 DIAGNOSIS — I10 Essential (primary) hypertension: Secondary | ICD-10-CM

## 2012-03-16 LAB — BASIC METABOLIC PANEL
BUN: 13 mg/dL (ref 6–23)
Chloride: 104 mEq/L (ref 96–112)
Creatinine, Ser: 1.1 mg/dL (ref 0.4–1.5)
GFR: 73.9 mL/min (ref >60.00)
Glucose, Bld: 105 mg/dL — ABNORMAL HIGH (ref 70–99)
Potassium: 4.1 mEq/L (ref 3.5–5.1)

## 2012-03-16 LAB — URINALYSIS, ROUTINE W REFLEX MICROSCOPIC
Leukocytes, UA: NEGATIVE
Nitrite: NEGATIVE
Specific Gravity, Urine: >=1.03 (ref 1.000–1.030)
Total Protein, Urine: NEGATIVE
pH: 6 (ref 5.0–8.0)

## 2012-03-16 LAB — HEPATIC FUNCTION PANEL
ALT: 25 U/L (ref 0–53)
AST: 22 U/L (ref 0–37)
Albumin: 3.6 g/dL (ref 3.5–5.2)
Total Bilirubin: 0.9 mg/dL (ref 0.3–1.2)

## 2012-03-16 LAB — CBC WITH DIFFERENTIAL/PLATELET
Basophils Relative: 0.4 % (ref 0.0–3.0)
Eosinophils Relative: 3.3 % (ref 0.0–5.0)
Monocytes Relative: 10.6 % (ref 3.0–12.0)
Neutrophils Relative %: 46.2 % (ref 43.0–77.0)
Platelets: 263 10*3/uL (ref 150.0–400.0)
RBC: 5.38 Mil/uL (ref 4.22–5.81)
WBC: 5.9 10*3/uL (ref 4.5–10.5)

## 2012-03-16 LAB — LIPID PANEL
Cholesterol: 201 mg/dL — ABNORMAL HIGH (ref 0–200)
HDL: 39.5 mg/dL (ref >39.00)
VLDL: 35.2 mg/dL (ref 0.0–40.0)

## 2012-03-16 LAB — TSH: TSH: 5.48 u[IU]/mL (ref 0.35–5.50)

## 2012-03-16 LAB — LDL CHOLESTEROL, DIRECT: Direct LDL: 133.4 mg/dL

## 2012-03-16 LAB — TESTOSTERONE: Testosterone: 332.41 ng/dL — ABNORMAL LOW (ref 350.00–890.00)

## 2012-04-08 ENCOUNTER — Encounter: Payer: BC Managed Care – PPO | Admitting: Internal Medicine

## 2012-09-22 ENCOUNTER — Other Ambulatory Visit: Payer: Self-pay | Admitting: Internal Medicine

## 2012-09-22 NOTE — Telephone Encounter (Signed)
Done hardcopy to robin  

## 2012-09-23 NOTE — Telephone Encounter (Signed)
Faxed hardcopy to [facility] [address]

## 2013-01-14 ENCOUNTER — Other Ambulatory Visit: Payer: Self-pay

## 2013-02-23 ENCOUNTER — Telehealth: Payer: Self-pay

## 2013-02-23 DIAGNOSIS — Z0000 Encounter for general adult medical examination without abnormal findings: Secondary | ICD-10-CM

## 2013-02-23 NOTE — Telephone Encounter (Signed)
Labs entered for January CPX

## 2013-03-01 ENCOUNTER — Other Ambulatory Visit: Payer: Self-pay | Admitting: Internal Medicine

## 2013-03-02 MED ORDER — ZOLPIDEM TARTRATE 10 MG PO TABS: ORAL_TABLET | ORAL | Status: DC

## 2013-03-02 NOTE — Telephone Encounter (Signed)
Faxed hardcopy to [facility] [address] 

## 2013-03-02 NOTE — Telephone Encounter (Signed)
Done hardcopy to robin  Due for ROV 1-2 mo 

## 2013-03-05 ENCOUNTER — Encounter (HOSPITAL_COMMUNITY): Payer: Self-pay | Admitting: Emergency Medicine

## 2013-03-05 ENCOUNTER — Emergency Department (HOSPITAL_COMMUNITY)
Admission: EM | Admit: 2013-03-05 | Discharge: 2013-03-05 | Disposition: A | Discharge status: Home / Self Care | Payer: BC Managed Care – PPO | Attending: Emergency Medicine | Admitting: Emergency Medicine

## 2013-03-05 DIAGNOSIS — Z79899 Other long term (current) drug therapy: Secondary | ICD-10-CM | POA: Insufficient documentation

## 2013-03-05 DIAGNOSIS — Y929 Unspecified place or not applicable: Secondary | ICD-10-CM | POA: Insufficient documentation

## 2013-03-05 DIAGNOSIS — Z8719 Personal history of other diseases of the digestive system: Secondary | ICD-10-CM | POA: Insufficient documentation

## 2013-03-05 DIAGNOSIS — Z880 Allergy status to penicillin: Secondary | ICD-10-CM | POA: Insufficient documentation

## 2013-03-05 DIAGNOSIS — S0181XA Laceration without foreign body of other part of head, initial encounter: Secondary | ICD-10-CM

## 2013-03-05 DIAGNOSIS — Z8739 Personal history of other diseases of the musculoskeletal system and connective tissue: Secondary | ICD-10-CM | POA: Insufficient documentation

## 2013-03-05 DIAGNOSIS — Z8639 Personal history of other endocrine, nutritional and metabolic disease: Secondary | ICD-10-CM | POA: Insufficient documentation

## 2013-03-05 DIAGNOSIS — S058X9A Other injuries of unspecified eye and orbit, initial encounter: Secondary | ICD-10-CM | POA: Insufficient documentation

## 2013-03-05 DIAGNOSIS — Z8669 Personal history of other diseases of the nervous system and sense organs: Secondary | ICD-10-CM | POA: Insufficient documentation

## 2013-03-05 DIAGNOSIS — Z792 Long term (current) use of antibiotics: Secondary | ICD-10-CM | POA: Insufficient documentation

## 2013-03-05 DIAGNOSIS — Z862 Personal history of diseases of the blood and blood-forming organs and certain disorders involving the immune mechanism: Secondary | ICD-10-CM | POA: Insufficient documentation

## 2013-03-05 DIAGNOSIS — Y9389 Activity, other specified: Secondary | ICD-10-CM | POA: Insufficient documentation

## 2013-03-05 DIAGNOSIS — IMO0002 Reserved for concepts with insufficient information to code with codable children: Secondary | ICD-10-CM | POA: Insufficient documentation

## 2013-03-05 DIAGNOSIS — I10 Essential (primary) hypertension: Secondary | ICD-10-CM | POA: Insufficient documentation

## 2013-03-05 MED ORDER — HYDROCODONE-ACETAMINOPHEN 5-325 MG PO TABS: 1.0000 | ORAL_TABLET | ORAL | Status: DC | PRN

## 2013-03-05 MED ORDER — LIDOCAINE-EPINEPHRINE-TETRACAINE (LET) SOLUTION
3.0000 mL | Freq: Once | NASAL | Status: AC
  Administered 2013-03-05: 3 mL via TOPICAL
  Filled 2013-03-05: qty 3

## 2013-03-05 NOTE — ED Notes (Signed)
Patient refusing visual acuity screening

## 2013-03-05 NOTE — ED Notes (Signed)
Unloading equipment off trailer, was hit in face, laceration noted under right eye, states vision is normal

## 2013-03-05 NOTE — ED Provider Notes (Signed)
CSN: [id number]     Arrival date & time 03/05/13  1444 History   First MD Initiated Contact with Patient 03/05/13 1645     This chart was scribed for Brad Ray by Ladona Ridgel Day, ED scribe. This patient was seen in room WTR7/WTR7 and the patient's care was started at 1444.  Chief Complaint  Patient presents with  . Eye Injury   The history is provided by the patient. No language interpreter was used.   HPI Comments: Brad Ray is a 52 y.o. male who presents to the Emergency Department complaining of blunt trauma by trailer hitch to his right lower eyelid and infraorbital area, 3 hours ago. He denies any double vision, no LOC, no pain w/EOM. He wears contacts, both eyes. Denies any other injuries.  UTD on tetanus.  Past Medical History  Diagnosis Date  . Abdominal pain, right upper quadrant 12/09/2007  . ALLERGIC RHINITIS 03/19/2007  . DEGENERATIVE JOINT DISEASE, LUMBAR SPINE 12/23/2006  . DISC DISEASE, CERVICAL 12/23/2006  . FATIGUE 04/28/2009  . GERD 12/23/2006  . HYPERCHOLESTEROLEMIA 12/23/2006  . HYPERLIPIDEMIA 03/19/2007  . HYPERSOMNIA 04/28/2009  . HYPERTENSION 03/19/2007  . HYPOTENSION 04/13/2009  . INSOMNIA, HX OF 12/23/2006  . LIVER MASS 12/11/2007  . MENIERE'S DISEASE 12/23/2006  . OTITIS MEDIA, LEFT 04/13/2009  . PERFORATION, TYMPANIC MEMBRANE NOS 12/23/2006   Past Surgical History  Procedure Laterality Date  . Left knee (mcl) repair    . S/p left ear surgury for vertigo    . S/p left knee medial collateral ligament damage     Family History  Problem Relation Age of Onset  . Diabetes Father   . Hypertension Father   . Alcohol abuse Other    History  Substance Use Topics  . Smoking status: Never Smoker   . Smokeless tobacco: Not on file  . Alcohol Use: Yes    Review of Systems  Constitutional: Negative for chills.  HENT: Negative for rhinorrhea.   Eyes: Positive for pain. Negative for visual disturbance.  Respiratory: Negative for cough.   Cardiovascular:  Negative for chest pain.  Musculoskeletal: Negative for back pain.  Neurological: Negative for headaches.  All other systems reviewed and are negative.    Allergies  Iohexol and Penicillins  Home Medications   Current Outpatient Rx  Name  Route  Sig  Dispense  Refill  . ANDROGEL 50 MG/5GM GEL      PLACE 5GM ONTO THE SKIN DAILY   450 g   1   . Multiple Vitamins-Minerals (CENTRUM SILVER PO)   Oral   Take 1 each by mouth daily.           Marland Kitchen telmisartan (MICARDIS) 80 MG tablet   Oral   Take 1 tablet (80 mg total) by mouth daily.   90 tablet   3   . telmisartan (MICARDIS) 80 MG tablet      TAKE 1 TABLET (80 MG TOTAL) BY MOUTH DAILY.   90 tablet   0     Yearly physical due in January must see md b4 addi ...   . tobramycin (TOBREX) 0.3 % ophthalmic solution   Both Eyes   Place 1 drop into both eyes 4 (four) times daily. For 10 days   5 mL   0   . vitamin B-12 (CYANOCOBALAMIN) 500 MCG tablet   Oral   Take 500 mcg by mouth daily.           . vitamin C (ASCORBIC ACID) 500 MG  tablet   Oral   Take 1,000 mg by mouth daily.           Marland Kitchen zolpidem (AMBIEN) 10 MG tablet      TAKE 1 TABLET AT BEDTIME FOR SLEEP   90 tablet   0    Triage Vitals: BP 139/91  Pulse 66  Temp(Src) 98.6 F (37 C) (Oral)  SpO2 97% Physical Exam  Nursing note and vitals reviewed. Constitutional: He is oriented to person, place, and time. He appears well-developed and well-nourished. No distress.  HENT:  Head: Normocephalic and atraumatic.  Eyes: Conjunctivae and EOM are normal. Pupils are equal, round, and reactive to light. Right eye exhibits no discharge. Left eye exhibits no discharge.  Inferior to right lower eyelid is area of superficial skin laceration measuring 2 cm w/surroudning mild skin edema and tenderness w/out obvious crepitance. No foreign object noted.  Pt wearing contact lens to both eyes EOM intact. PERRL Lids everted, no foreign object. No pain w/ eye movement.    Neck: Normal range of motion.  Cardiovascular: Normal rate.   Pulmonary/Chest: Effort normal. No respiratory distress.  Musculoskeletal: Normal range of motion. He exhibits no edema.  Neurological: He is alert and oriented to person, place, and time.  Skin: Skin is warm and dry.  Psychiatric: He has a normal mood and affect. Thought content normal.    ED Course  Procedures (including critical care time) DIAGNOSTIC STUDIES: Oxygen Saturation is 97% on room air, normal by my interpretation.    COORDINATION OF CARE: At 445 PM Discussed treatment plan with patient whichv includes visual acuity. Patient agrees. Offer to obtain Ct to r/o fx, pt declined.  Pt has no pain with eye movement, i also have low suspicion for occult fx, but cannot completely r/o without imaging.  Pt aware to return if having trouble with eye movement or having other concerns.  Is UTD with tetanus.     LACERATION REPAIR Performed by: Brad Ray Consent: Verbal consent obtained. Risks and benefits: risks, benefits and alternatives were discussed Patient identity confirmed: provided demographic data Time out performed prior to procedure Prepped and Draped in normal sterile fashion Wound explored Laceration Location:  inferior lower eyelid, down to subcuanious fat Laceration Length: 3cm No Foreign Bodies seen or palpated Anesthesia: local infiltration Local anesthetic: lidocaine 2% without epinephrine Anesthetic total: 2 ml Irrigation method: syringe Amount of cleaning: standard Skin closure: 6-0 prolene Number of sutures or staples: 7 Technique: simple interupted Patient tolerance: Patient tolerated the procedure well with no immediate complications.  Labs Review Labs Reviewed - No data to display Imaging Review No results found.  EKG Interpretation   None       MDM   1. Face lacerations, initial encounter    BP 139/91  Pulse 66  Temp(Src) 98.6 F (37 C) (Oral)  SpO2 97%  I personally  performed the services described in this documentation, which was scribed in my presence. The recorded information has been reviewed and is accurate.      Brad Helper, PA-C 03/05/13 [place]

## 2013-03-06 NOTE — ED Provider Notes (Signed)
Medical screening examination/treatment/procedure(s) were performed by non-physician practitioner and as supervising physician I was immediately available for consultation/collaboration.  EKG Interpretation   None         Junius Argyle, MD 03/06/13 1136

## 2013-03-09 DIAGNOSIS — IMO0002 Reserved for concepts with insufficient information to code with codable children: Secondary | ICD-10-CM | POA: Insufficient documentation

## 2013-03-17 ENCOUNTER — Other Ambulatory Visit (INDEPENDENT_AMBULATORY_CARE_PROVIDER_SITE_OTHER): Payer: BC Managed Care – PPO

## 2013-03-17 ENCOUNTER — Ambulatory Visit (INDEPENDENT_AMBULATORY_CARE_PROVIDER_SITE_OTHER): Payer: BC Managed Care – PPO | Admitting: Internal Medicine

## 2013-03-17 ENCOUNTER — Encounter: Payer: Self-pay | Admitting: Internal Medicine

## 2013-03-17 ENCOUNTER — Other Ambulatory Visit: Payer: Self-pay | Admitting: Internal Medicine

## 2013-03-17 VITALS — BP 120/90 | HR 62 | Temp 97.8°F | Ht 76.0 in | Wt 248.0 lb

## 2013-03-17 DIAGNOSIS — R7309 Other abnormal glucose: Secondary | ICD-10-CM

## 2013-03-17 DIAGNOSIS — E291 Testicular hypofunction: Secondary | ICD-10-CM

## 2013-03-17 DIAGNOSIS — Z8249 Family history of ischemic heart disease and other diseases of the circulatory system: Secondary | ICD-10-CM

## 2013-03-17 DIAGNOSIS — R7302 Impaired glucose tolerance (oral): Secondary | ICD-10-CM | POA: Insufficient documentation

## 2013-03-17 DIAGNOSIS — Z0000 Encounter for general adult medical examination without abnormal findings: Secondary | ICD-10-CM

## 2013-03-17 LAB — BASIC METABOLIC PANEL
BUN: 12 mg/dL (ref 6–23)
CHLORIDE: 106 meq/L (ref 96–112)
CO2: 27 meq/L (ref 19–32)
CREATININE: 1.1 mg/dL (ref 0.4–1.5)
Calcium: 9.4 mg/dL (ref 8.4–10.5)
GFR: 72.85 mL/min (ref >60.00)
Glucose, Bld: 96 mg/dL (ref 70–99)
Potassium: 5 mEq/L (ref 3.5–5.1)
Sodium: 141 mEq/L (ref 135–145)

## 2013-03-17 LAB — TSH: TSH: 5.31 u[IU]/mL (ref 0.35–5.50)

## 2013-03-17 LAB — CBC WITH DIFFERENTIAL/PLATELET
BASOS PCT: 0.3 % (ref 0.0–3.0)
Basophils Absolute: 0 10*3/uL (ref 0.0–0.1)
EOS ABS: 0.1 10*3/uL (ref 0.0–0.7)
Eosinophils Relative: 2.4 % (ref 0.0–5.0)
HCT: 50.7 % (ref 39.0–52.0)
Hemoglobin: 17.4 g/dL — ABNORMAL HIGH (ref 13.0–17.0)
LYMPHS PCT: 36 % (ref 12.0–46.0)
Lymphs Abs: 2.1 10*3/uL (ref 0.7–4.0)
MCHC: 34.2 g/dL (ref 30.0–36.0)
MCV: 88.4 fl (ref 78.0–100.0)
MONOS PCT: 10.3 % (ref 3.0–12.0)
Monocytes Absolute: 0.6 10*3/uL (ref 0.1–1.0)
NEUTROS PCT: 51 % (ref 43.0–77.0)
Neutro Abs: 2.9 10*3/uL (ref 1.4–7.7)
Platelets: 253 10*3/uL (ref 150.0–400.0)
RBC: 5.73 Mil/uL (ref 4.22–5.81)
RDW: 14.1 % (ref 11.5–14.6)
WBC: 5.7 10*3/uL (ref 4.5–10.5)

## 2013-03-17 LAB — HEPATIC FUNCTION PANEL
ALBUMIN: 4.2 g/dL (ref 3.5–5.2)
ALT: 32 U/L (ref 0–53)
AST: 24 U/L (ref 0–37)
Alkaline Phosphatase: 58 U/L (ref 39–117)
Bilirubin, Direct: 0.1 mg/dL (ref 0.0–0.3)
TOTAL PROTEIN: 7.6 g/dL (ref 6.0–8.3)
Total Bilirubin: 1 mg/dL (ref 0.3–1.2)

## 2013-03-17 LAB — LIPID PANEL
CHOL/HDL RATIO: 6
Cholesterol: 258 mg/dL — ABNORMAL HIGH (ref 0–200)
HDL: 46.9 mg/dL (ref >39.00)
Triglycerides: 189 mg/dL — ABNORMAL HIGH (ref 0.0–149.0)
VLDL: 37.8 mg/dL (ref 0.0–40.0)

## 2013-03-17 LAB — URINALYSIS, ROUTINE W REFLEX MICROSCOPIC
BILIRUBIN URINE: NEGATIVE
Hgb urine dipstick: NEGATIVE
Ketones, ur: NEGATIVE
Leukocytes, UA: NEGATIVE
Nitrite: NEGATIVE
Specific Gravity, Urine: 1.025 (ref 1.000–1.030)
TOTAL PROTEIN, URINE-UPE24: NEGATIVE
URINE GLUCOSE: NEGATIVE
UROBILINOGEN UA: 0.2 (ref 0.0–1.0)
pH: 5.5 (ref 5.0–8.0)

## 2013-03-17 LAB — LDL CHOLESTEROL, DIRECT: Direct LDL: 173.7 mg/dL

## 2013-03-17 LAB — PSA: PSA: 0.92 ng/mL (ref 0.10–4.00)

## 2013-03-17 MED ORDER — TELMISARTAN 80 MG PO TABS: 80.0000 mg | ORAL_TABLET | Freq: Every day | ORAL | Status: DC

## 2013-03-17 MED ORDER — ATORVASTATIN CALCIUM 10 MG PO TABS: 10.0000 mg | ORAL_TABLET | Freq: Every day | ORAL | Status: DC

## 2013-03-17 MED ORDER — ZOLPIDEM TARTRATE 10 MG PO TABS: 10.0000 mg | ORAL_TABLET | Freq: Every evening | ORAL | Status: DC | PRN

## 2013-03-17 NOTE — Progress Notes (Signed)
Subjective:    Patient ID: Brad Ray, male    DOB: 07/08/1960, 53 y.o.   MRN: [id number]  HPI Here for wellness and f/u;  Overall doing ok;  Pt denies CP, worsening SOB, DOE, wheezing, orthopnea, PND, worsening LE edema, palpitations, dizziness or syncope.  Pt denies neurological change such as new headache, facial or extremity weakness.  Pt denies polydipsia, polyuria, or low sugar symptoms. Pt states overall good compliance with treatment and medications, good tolerability, and has been trying to follow lower cholesterol diet.  Pt denies worsening depressive symptoms, suicidal ideation or panic. No fever, night sweats, wt loss, loss of appetite, or other constitutional symptoms.  Pt states good ability with ADL's, has low fall risk, home safety reviewed and adequate, no other significant changes in hearing or vision, and only occasionally active with exercise.  Has strong FH CAD, ? Of aortic disease due to HTN from yrs ago.  Past Medical History  Diagnosis Date  . Abdominal pain, right upper quadrant 12/09/2007  . ALLERGIC RHINITIS 03/19/2007  . DEGENERATIVE JOINT DISEASE, LUMBAR SPINE 12/23/2006  . [place] DISEASE, CERVICAL 12/23/2006  . FATIGUE 04/28/2009  . GERD 12/23/2006  . HYPERCHOLESTEROLEMIA 12/23/2006  . HYPERLIPIDEMIA 03/19/2007  . HYPERSOMNIA 04/28/2009  . HYPERTENSION 03/19/2007  . HYPOTENSION 04/13/2009  . INSOMNIA, HX OF 12/23/2006  . LIVER MASS 12/11/2007  . MENIERE'S DISEASE 12/23/2006  . OTITIS MEDIA, LEFT 04/13/2009  . PERFORATION, TYMPANIC MEMBRANE NOS 12/23/2006   Past Surgical History  Procedure Laterality Date  . Left knee (mcl) repair    . S/p left ear surgury for vertigo    . S/p left knee medial collateral ligament damage      reports that he has never smoked. He does not have any smokeless tobacco history on file. He reports that he drinks alcohol. He reports that he does not use illicit drugs. family history includes Alcohol abuse in his other; Diabetes in his  father; Hypertension in his father. Allergies  Allergen Reactions  . Iohexol      Desc: PT sneezed twice during injection of Omni 300. No distress. Not treated. Observed by Dr Ron Parker., Onset Date: [id number]   . Penicillins     Childhood reaction   Current Outpatient Prescriptions on File Prior to Visit  Medication Sig Dispense Refill  . Multiple Vitamins-Minerals (CENTRUM SILVER PO) Take 1 each by mouth daily.        . vitamin B-12 (CYANOCOBALAMIN) 500 MCG tablet Take 500 mcg by mouth daily.        . vitamin C (ASCORBIC ACID) 500 MG tablet Take 1,000 mg by mouth daily.         No current facility-administered medications on file prior to visit.   Review of Systems Constitutional: Negative for diaphoresis, activity change, appetite change or unexpected weight change.  HENT: Negative for hearing loss, ear pain, facial swelling, mouth sores and neck stiffness.   Eyes: Negative for pain, redness and visual disturbance.  Respiratory: Negative for shortness of breath and wheezing.   Cardiovascular: Negative for chest pain and palpitations.  Gastrointestinal: Negative for diarrhea, blood in stool, abdominal distention or other pain Genitourinary: Negative for hematuria, flank pain or change in urine volume.  Musculoskeletal: Negative for myalgias and joint swelling.  Skin: Negative for color change and wound.  Neurological: Negative for syncope and numbness. other than noted Hematological: Negative for adenopathy.  Psychiatric/Behavioral: Negative for hallucinations, self-injury, decreased concentration and agitation.      Objective:  Physical Exam BP 120/90  Pulse 62  Temp(Src) 97.8 F (36.6 C) (Oral)  Ht 6\' 4"  (1.93 m)  Wt 248 lb (112.492 kg)  BMI 30.20 kg/m2  SpO2 97% VS noted,  Constitutional: Pt is oriented to person, place, and time. Appears well-developed and well-nourished.  Head: Normocephalic and atraumatic.  Right Ear: External ear normal.  Left Ear: External ear  normal.  Nose: Nose normal.  Mouth/Throat: Oropharynx is clear and moist.  Eyes: Conjunctivae and EOM are normal. Pupils are equal, round, and reactive to light.  Neck: Normal range of motion. Neck supple. No JVD present. No tracheal deviation present.  Cardiovascular: Normal rate, regular rhythm, normal heart sounds and intact distal pulses.   Pulmonary/Chest: Effort normal and breath sounds normal.  Abdominal: Soft. Bowel sounds are normal. There is no tenderness. No HSM  Musculoskeletal: Normal range of motion. Exhibits no edema.  Lymphadenopathy:  Has no cervical adenopathy.  Neurological: Pt is alert and oriented to person, place, and time. Pt has normal reflexes. No cranial nerve deficit.  Skin: Skin is warm and dry. No rash noted.  Psychiatric:  Has  normal mood and affect. Behavior is normal.      Assessment & Plan:

## 2013-03-17 NOTE — Assessment & Plan Note (Signed)
Also add a1c

## 2013-03-17 NOTE — Assessment & Plan Note (Signed)
To add testostesrone

## 2013-03-17 NOTE — Progress Notes (Signed)
Pre visit review using our clinic review tool, if applicable. No additional management support is needed unless otherwise documented below in the visit note. 

## 2013-03-17 NOTE — Patient Instructions (Addendum)
Please continue all other medications as before, and refills have been done if requested. Please have the pharmacy call with any other refills you may need. Please continue your efforts at being more active, low cholesterol diet, and weight control. You are otherwise up to date with prevention measures today. Your blood work was done today We will addon a test for A1c (for sugar) and testosterone You will be contacted by phone if any changes need to be made immediately.  Otherwise, you will receive a letter about your results with an explanation, but please check with MyChart first.  Please remember to sign up for My Chart if you have not done so, as this will be important to you in the future with finding out test results, communicating by private email, and scheduling acute appointments online when needed.  You will be contacted regarding the referral for: colonoscopy, and cardiology  Please return in 1 year for your yearly visit, or sooner if needed, with Lab testing done 3-5 days before

## 2013-03-17 NOTE — Assessment & Plan Note (Signed)

## 2013-03-18 ENCOUNTER — Telehealth: Payer: Self-pay | Admitting: *Deleted

## 2013-03-18 NOTE — Telephone Encounter (Signed)
Very sorry; staff was not able to phone him today as usually happens before the pharmacy contacted him  Staff to call pt with above, and also with last message as well regarding labs as apparently this has not yet been done

## 2013-03-18 NOTE — Telephone Encounter (Signed)
Patient phoned, stating he was seen by Dr. Jenny Reichmann yesterday and that today, pharmacy has a cholesterol medication filled for him that he was unaware was being prescribed.  Does not know/understand lab results.  Please advise.  CB# [phone]

## 2013-03-19 ENCOUNTER — Ambulatory Visit: Payer: BC Managed Care – PPO

## 2013-03-19 DIAGNOSIS — E291 Testicular hypofunction: Secondary | ICD-10-CM

## 2013-03-19 DIAGNOSIS — R7309 Other abnormal glucose: Secondary | ICD-10-CM

## 2013-03-19 LAB — HEMOGLOBIN A1C: Hgb A1c MFr Bld: 5.2 % (ref 4.6–6.5)

## 2013-03-19 LAB — TESTOSTERONE: Testosterone: 312.24 ng/dL — ABNORMAL LOW (ref 350.00–890.00)

## 2013-03-19 NOTE — Telephone Encounter (Signed)
Left message on machine for patient to return our call 

## 2013-03-23 ENCOUNTER — Encounter: Payer: Self-pay | Admitting: Gastroenterology

## 2013-03-23 NOTE — Telephone Encounter (Signed)
Have spoken to the patients wife this am regarding results and patient will call back to discuss.

## 2013-03-31 ENCOUNTER — Ambulatory Visit (INDEPENDENT_AMBULATORY_CARE_PROVIDER_SITE_OTHER): Payer: BC Managed Care – PPO | Admitting: Cardiovascular Disease

## 2013-03-31 ENCOUNTER — Encounter: Payer: Self-pay | Admitting: Cardiovascular Disease

## 2013-03-31 VITALS — BP 112/78 | HR 78 | Ht 76.0 in | Wt 247.0 lb

## 2013-03-31 DIAGNOSIS — H8109 Meniere's disease, unspecified ear: Secondary | ICD-10-CM

## 2013-03-31 DIAGNOSIS — E785 Hyperlipidemia, unspecified: Secondary | ICD-10-CM

## 2013-03-31 DIAGNOSIS — R5381 Other malaise: Secondary | ICD-10-CM

## 2013-03-31 DIAGNOSIS — I10 Essential (primary) hypertension: Secondary | ICD-10-CM

## 2013-03-31 DIAGNOSIS — I7789 Other specified disorders of arteries and arterioles: Secondary | ICD-10-CM

## 2013-03-31 DIAGNOSIS — I2510 Atherosclerotic heart disease of native coronary artery without angina pectoris: Secondary | ICD-10-CM

## 2013-03-31 DIAGNOSIS — R5383 Other fatigue: Secondary | ICD-10-CM

## 2013-03-31 NOTE — Assessment & Plan Note (Signed)
Fortunately this is quiescent now.  Has caused him to not drive for years in past.  Has ENT in town and has seen one at [facility]  PRN antivert has been on diuretic in past to minimize inner ear fluid but not needed now

## 2013-03-31 NOTE — Assessment & Plan Note (Signed)
Well controlled.  Continue current medications and low sodium Dash type diet.   He was worried about his aorta.  Quick screen can include CXR  No AR murmur  On exam or reason to expect significant dilatation

## 2013-03-31 NOTE — Progress Notes (Signed)
Patient ID: Brad Ray, male   DOB: 20-Sep-1960, 53 y.o.   MRN: [id number] Brad Ray is a 72 yo friend of mine who has concerns about his heart.  He use to play for the [employer].  He has CRFls HTN and elevated lipids.  Has chronic vertigo due to inner ear injury while playing.  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  Weight up a bit during the holidays.  Hit under right eye by trailer hitch recently with sutures Compliant with meds Diet could be better in regard to salt and carbs  No smoking Occasional ETOH     ROS: Denies fever, malais, weight loss, blurry vision, decreased visual acuity, cough, sputum, SOB, hemoptysis, pleuritic pain, palpitaitons, heartburn, abdominal pain, melena, lower extremity edema, claudication, or rash.  All other systems reviewed and negative   General: Affect appropriate Healthy:  appears stated age 53: normal  Recent Laceration repair under right eye  Neck supple with no adenopathy JVP normal no bruits no thyromegaly Lungs clear with no wheezing and good diaphragmatic motion Heart:  S1/S2 no murmur,rub, gallop or click PMI normal Abdomen: benighn, BS positve, no tenderness, no AAA no bruit.  No HSM or HJR Distal pulses intact with no bruits No edema Neuro non-focal Skin warm and dry No muscular weakness  Medications Current Outpatient Prescriptions  Medication Sig Dispense Refill  . atorvastatin (LIPITOR) 10 MG tablet Take 1 tablet (10 mg total) by mouth daily.  90 tablet  3  . Multiple Vitamins-Minerals (CENTRUM SILVER PO) Take 1 each by mouth daily.        Marland Kitchen telmisartan (MICARDIS) 80 MG tablet Take 1 tablet (80 mg total) by mouth daily.  90 tablet  3  . vitamin B-12 (CYANOCOBALAMIN) 500 MCG tablet  Take 500 mcg by mouth daily.        . vitamin C (ASCORBIC ACID) 500 MG tablet Take 1,000 mg by mouth daily.        Marland Kitchen zolpidem (AMBIEN) 10 MG tablet Take 1 tablet (10 mg total) by mouth at bedtime as needed for sleep.  30 tablet  5   No current facility-administered medications for this visit.    Allergies Iohexol and Penicillins  Family History: Family History  Problem Relation Age of Onset  . Diabetes Father   . Hypertension Father   . Alcohol abuse Other     Social History: History   Social History  . Marital Status: Married    Spouse Name: N/A    Number of Children: 1  . Years of Education: N/A   Occupational History  . Not on file.   Social History Main Topics  . Smoking status: Never Smoker   . Smokeless tobacco: Not on file  . Alcohol Use: Yes  . Drug Use: No  . Sexual Activity: Not on file   Other Topics Concern  . Not on file   Social History Narrative   1 child at [employer]    Electrocardiogram:  NSR normal ECG   Assessment and Plan

## 2013-03-31 NOTE — Assessment & Plan Note (Signed)
No pathologic findings on exam.  He has concerns about CAD  Will order ETT to make sure exercise HR and MET level is adequate for age.

## 2013-03-31 NOTE — Assessment & Plan Note (Signed)
Recent jump due to dietary indiscretion He does not want to be on statin for nex 30 years.  Calcium Score to see if he has CAD If calcium score 0 can consider Rx with diet and exercise

## 2013-03-31 NOTE — Patient Instructions (Signed)
Your physician recommends that you schedule a follow-up appointment in:  AS NEEDED  Your physician recommends that you continue on your current medications as directed. Please refer to the Current Medication list given to you today. Your physician has requested that you have an exercise tolerance test. For further information please visit [email or web address]. Please also follow instruction sheet, as given.  CALCIUM  SCORE OUT OF  POCKET  $150.00  Your physician has requested that you have an exercise tolerance test. For further information please visit [email or web address]. Please also follow instruction sheet, as given.

## 2013-04-28 ENCOUNTER — Ambulatory Visit (AMBULATORY_SURGERY_CENTER): Payer: Self-pay

## 2013-04-28 VITALS — Ht 76.0 in | Wt 250.0 lb

## 2013-04-28 DIAGNOSIS — Z1211 Encounter for screening for malignant neoplasm of colon: Secondary | ICD-10-CM

## 2013-04-28 MED ORDER — SUPREP BOWEL PREP KIT 17.5-3.13-1.6 GM/177ML PO SOLN: 1.0000 | Freq: Once | ORAL | Status: DC

## 2013-04-30 ENCOUNTER — Encounter: Payer: Self-pay | Admitting: Gastroenterology

## 2013-05-03 ENCOUNTER — Encounter: Payer: Self-pay | Admitting: Nurse Practitioner

## 2013-05-03 ENCOUNTER — Ambulatory Visit (INDEPENDENT_AMBULATORY_CARE_PROVIDER_SITE_OTHER)
Admission: RE | Admit: 2013-05-03 | Discharge: 2013-05-03 | Disposition: A | Discharge status: Home / Self Care | Payer: Self-pay | Source: Ambulatory Visit | Attending: Cardiovascular Disease | Admitting: Cardiovascular Disease

## 2013-05-03 ENCOUNTER — Ambulatory Visit (INDEPENDENT_AMBULATORY_CARE_PROVIDER_SITE_OTHER): Payer: BC Managed Care – PPO | Admitting: Nurse Practitioner

## 2013-05-03 VITALS — BP 113/82 | HR 88

## 2013-05-03 DIAGNOSIS — I2510 Atherosclerotic heart disease of native coronary artery without angina pectoris: Secondary | ICD-10-CM

## 2013-05-03 NOTE — Progress Notes (Signed)
Exercise Treadmill Test  Pre-Exercise Testing Evaluation Rhythm: normal sinus  Rate: 62 bpm     Test  Exercise Tolerance Test Ordering MD: Jenkins Rouge, MD  Interpreting MD: Truitt Merle, NP  Unique Test No: 1  Treadmill:  1  Indication for ETT: known ASHD  Contraindication to ETT: No   Stress Modality: exercise - treadmill  Cardiac Imaging Performed: non   Protocol: standard Bruce - maximal  Max BP:  199/94  Max MPHR (bpm):  170 85% MPR (bpm):  145  MPHR obtained (bpm):  157 % MPHR obtained:  92%  Reached 85% MPHR (min:sec):  10:02 Total Exercise Time (min-sec):  11 minutes  Workload in METS:  13.4 Borg Scale: 14  Reason ETT Terminated:  patient's desire to stop    ST Segment Analysis At Rest: normal ST segments - no evidence of significant ST depression With Exercise: no evidence of significant ST depression  Other Information Arrhythmia:  No Angina during ETT:  absent (0) Quality of ETT:  diagnostic  ETT Interpretation:  normal - no evidence of ischemia by ST analysis  Comments: Patient presents today for routine GXT. Has HTN, HLD and concerned about his cardiac health. FH positive for CAD. Non smoker.    Today the patient exercised on the standard Bruce protocol for a total of 11 minutes.  Excellent exercise tolerance.  Adequate blood pressure response.  Clinically negative for chest pain. Test was stopped due to achievement of target HR.  EKG negative for ischemia. No significant arrhythmia noted.   Recommendations: CV risk factor modification.  Await CT scoring results  See back prn.  Patient is agreeable to this plan and will call if any problems develop in the interim.  Burtis Junes, RN, [facility] [address] [place], [place]  [phone]

## 2013-05-12 ENCOUNTER — Ambulatory Visit (AMBULATORY_SURGERY_CENTER): Payer: BC Managed Care – PPO | Admitting: Gastroenterology

## 2013-05-12 ENCOUNTER — Encounter: Payer: Self-pay | Admitting: Gastroenterology

## 2013-05-12 VITALS — BP 113/74 | HR 61 | Temp 96.0°F | Resp 21 | Ht 76.0 in | Wt 250.0 lb

## 2013-05-12 DIAGNOSIS — Z1211 Encounter for screening for malignant neoplasm of colon: Secondary | ICD-10-CM

## 2013-05-12 DIAGNOSIS — D126 Benign neoplasm of colon, unspecified: Secondary | ICD-10-CM

## 2013-05-12 DIAGNOSIS — K648 Other hemorrhoids: Secondary | ICD-10-CM

## 2013-05-12 MED ORDER — SODIUM CHLORIDE 0.9 % IV SOLN: 500.0000 mL | INTRAVENOUS | Status: DC

## 2013-05-12 NOTE — Progress Notes (Signed)
Lidocaine-40mg IV prior to Propofol InductionPropofol given over incremental dosages 

## 2013-05-12 NOTE — Op Note (Signed)
[place]  [address] [place], [address]   COLONOSCOPY PROCEDURE REPORT  PATIENT: Brad Ray, Brad Ray  MR#: [id number] BIRTHDATE: [birth year] , 52  yrs. old GENDER: Male ENDOSCOPIST: Inda Castle, MD REFERRED JS:EGBTD John, M.D. PROCEDURE DATE:  05/12/2013 PROCEDURE:   Colonoscopy with snare polypectomy and Submucosal injection, any substance First Screening Colonoscopy - Avg.  risk and is 50 yrs.  old or older Yes.  Prior Negative Screening - Now for repeat screening. N/A  History of Adenoma - Now for follow-up colonoscopy & has been > or = to 3 yrs.  N/A  Polyps Removed Today? Yes. ASA CLASS:   Class II INDICATIONS:average risk screening. MEDICATIONS: MAC sedation, administered by CRNA and propofol (Diprivan) 250mg  IV  DESCRIPTION OF PROCEDURE:   After the risks benefits and alternatives of the procedure were thoroughly explained, informed consent was obtained.  A digital rectal exam revealed no abnormalities of the rectum.   The LB [id number]  endoscope was introduced through the anus and advanced to the cecum, which was identified by both the appendix and ileocecal valve. No adverse events experienced.   The quality of the prep was Suprep good  The instrument was then slowly withdrawn as the colon was fully examined.      COLON FINDINGS: A flat polyp measuring 15 mm in size was found at the cecum.  A polypectomy was performed using snare cautery.  The resection was complete and the polyp tissue was completely retrieved.  A saline Injection 2cc was given to lift the mucosal wall.   A sessile polyp measuring 3 mm in size was found at the cecum.  A polypectomy was performed.  The resection was complete and the polyp tissue was completely retrieved.   Internal hemorrhoids were found.  Retroflexed views revealed no abnormalities. The time to cecum=2 minutes 15 seconds.  Withdrawal time=14 minutes 25 seconds.  The scope was withdrawn and  the procedure completed. COMPLICATIONS: There were no complications.  ENDOSCOPIC IMPRESSION: 1.   Flat polyp measuring 15 mm in size was found at the cecum; polypectomy was performed using snare cautery; saline was given to lift the mucosal wall 2.   Sessile polyp measuring 3 mm in size was found at the cecum; polypectomy was performed 3.   Internal hemorrhoids  RECOMMENDATIONS: If the polyp(s) removed today are proven to be adenomatous (pre-cancerous) polyps, you will need a colonoscopy in 3 years. Otherwise you should continue to follow colorectal cancer screening guidelines for "routine risk" patients with a colonoscopy in 10 years.  You will receive a letter within 1-2 weeks with the results of your biopsy as well as final recommendations.  Please call my office if you have not received a letter after 3 weeks.   eSigned:  Inda Castle, MD 05/12/2013 12:13 PM   cc:   PATIENT NAME:  [name], [name] MR#: [id number]

## 2013-05-12 NOTE — Patient Instructions (Signed)
YOU HAD AN ENDOSCOPIC PROCEDURE TODAY AT THE [facility]: Refer to the procedure report that was given to you for any specific questions about what was found during the examination.  If the procedure report does not answer your questions, please call your gastroenterologist to clarify.  If you requested that your care partner not be given the details of your procedure findings, then the procedure report has been included in a sealed envelope for you to review at your convenience later.  YOU SHOULD EXPECT: Some feelings of bloating in the abdomen. Passage of more gas than usual.  Walking can help get rid of the air that was put into your GI tract during the procedure and reduce the bloating. If you had a lower endoscopy (such as a colonoscopy or flexible sigmoidoscopy) you may notice spotting of blood in your stool or on the toilet paper. If you underwent a bowel prep for your procedure, then you may not have a normal bowel movement for a few days.  DIET: Your first meal following the procedure should be a light meal and then it is ok to progress to your normal diet.  A half-sandwich or bowl of soup is an example of a good first meal.  Heavy or fried foods are harder to digest and may make you feel nauseous or bloated.  Likewise meals heavy in dairy and vegetables can cause extra gas to form and this can also increase the bloating.  Drink plenty of fluids but you should avoid alcoholic beverages for 24 hours.  ACTIVITY: Your care partner should take you home directly after the procedure.  You should plan to take it easy, moving slowly for the rest of the day.  You can resume normal activity the day after the procedure however you should NOT DRIVE or use heavy machinery for 24 hours (because of the sedation medicines used during the test).    SYMPTOMS TO REPORT IMMEDIATELY: A gastroenterologist can be reached at any hour.  During normal business hours, 8:30 AM to 5:00 PM Monday through Friday,  call [phone].  After hours and on weekends, please call the GI answering service at [phone] who will take a message and have the physician on call contact you.   Following lower endoscopy (colonoscopy or flexible sigmoidoscopy):  Excessive amounts of blood in the stool  Significant tenderness or worsening of abdominal pains  Swelling of the abdomen that is new, acute  Fever of 100F or higher  FOLLOW UP: If any biopsies were taken you will be contacted by phone or by letter within the next 1-3 weeks.  Call your gastroenterologist if you have not heard about the biopsies in 3 weeks.  Our staff will call the home number listed on your records the next business day following your procedure to check on you and address any questions or concerns that you may have at that time regarding the information given to you following your procedure. This is a courtesy call and so if there is no answer at the home number and we have not heard from you through the emergency physician on call, we will assume that you have returned to your regular daily activities without incident.  SIGNATURES/CONFIDENTIALITY: You and/or your care partner have signed paperwork which will be entered into your electronic medical record.  These signatures attest to the fact that that the information above on your After Visit Summary has been reviewed and is understood.  Full responsibility of the confidentiality of this   this discharge information lies with you and/or your care-partner.  Polyps, hemorrhoids-handouts given  Repeat colonoscopy will be determined by pathology.   

## 2013-05-12 NOTE — Progress Notes (Signed)
Called to room to assist during endoscopic procedure.  Patient ID and intended procedure confirmed with present staff. Received instructions for my participation in the procedure from the performing physician.  

## 2013-05-13 ENCOUNTER — Telehealth: Payer: Self-pay | Admitting: *Deleted

## 2013-05-13 NOTE — Telephone Encounter (Signed)
  Follow up Call-  Call back number 05/12/2013  Post procedure Call Back phone  # [phone]  Permission to leave phone message Yes     Patient questions:  Do you have a fever, pain , or abdominal swelling? no Pain Score  0 *  Have you tolerated food without any problems? yes  Have you been able to return to your normal activities? yes  Do you have any questions about your discharge instructions: Diet   no Medications  no Follow up visit  no  Do you have questions or concerns about your Care? no  Actions: * If pain score is 4 or above: No action needed, pain <4.

## 2013-05-17 ENCOUNTER — Encounter: Payer: Self-pay | Admitting: Gastroenterology

## 2013-10-07 ENCOUNTER — Other Ambulatory Visit: Payer: Self-pay | Admitting: Internal Medicine

## 2013-10-08 ENCOUNTER — Other Ambulatory Visit: Payer: Self-pay | Admitting: Internal Medicine

## 2013-10-08 NOTE — Telephone Encounter (Signed)
Faxed hardcopy for Zolpidem 10 mg to [facility] [address] [place]

## 2013-10-08 NOTE — Telephone Encounter (Signed)
OK X1 

## 2013-11-25 ENCOUNTER — Other Ambulatory Visit: Payer: Self-pay | Admitting: Internal Medicine

## 2013-11-25 NOTE — Telephone Encounter (Signed)
Faxed hardcopy for Zolpidem to [facility] [address] [place] 

## 2013-11-25 NOTE — Telephone Encounter (Signed)
Done hardcopy to robin  

## 2014-01-05 ENCOUNTER — Telehealth: Payer: Self-pay | Admitting: Internal Medicine

## 2014-01-05 DIAGNOSIS — Z0000 Encounter for general adult medical examination without abnormal findings: Secondary | ICD-10-CM

## 2014-01-05 NOTE — Telephone Encounter (Signed)
Patient has appt on 11/4.  He is requesting to come in earlier for lab work.

## 2014-01-05 NOTE — Telephone Encounter (Signed)
Labs entered and patient informed.( Left detailed message)

## 2014-01-07 ENCOUNTER — Other Ambulatory Visit (INDEPENDENT_AMBULATORY_CARE_PROVIDER_SITE_OTHER): Payer: BC Managed Care – PPO

## 2014-01-07 DIAGNOSIS — Z0000 Encounter for general adult medical examination without abnormal findings: Secondary | ICD-10-CM

## 2014-01-07 LAB — BASIC METABOLIC PANEL
BUN: 13 mg/dL (ref 6–23)
CO2: 24 mEq/L (ref 19–32)
Calcium: 9.2 mg/dL (ref 8.4–10.5)
Chloride: 105 mEq/L (ref 96–112)
Creatinine, Ser: 1.1 mg/dL (ref 0.4–1.5)
GFR: 78.18 mL/min (ref >60.00)
GLUCOSE: 93 mg/dL (ref 70–99)
Potassium: 4.5 mEq/L (ref 3.5–5.1)
Sodium: 138 mEq/L (ref 135–145)

## 2014-01-07 LAB — URINALYSIS, ROUTINE W REFLEX MICROSCOPIC
BILIRUBIN URINE: NEGATIVE
Hgb urine dipstick: NEGATIVE
Ketones, ur: NEGATIVE
Leukocytes, UA: NEGATIVE
NITRITE: NEGATIVE
PH: 6 (ref 5.0–8.0)
SPECIFIC GRAVITY, URINE: 1.025 (ref 1.000–1.030)
Total Protein, Urine: NEGATIVE
UROBILINOGEN UA: 0.2 (ref 0.0–1.0)
Urine Glucose: NEGATIVE
WBC, UA: NONE SEEN (ref 0–?)

## 2014-01-07 LAB — CBC WITH DIFFERENTIAL/PLATELET
BASOS PCT: 0.2 % (ref 0.0–3.0)
Basophils Absolute: 0 10*3/uL (ref 0.0–0.1)
EOS ABS: 0.2 10*3/uL (ref 0.0–0.7)
EOS PCT: 3.3 % (ref 0.0–5.0)
HCT: 48 % (ref 39.0–52.0)
HEMOGLOBIN: 16.1 g/dL (ref 13.0–17.0)
LYMPHS ABS: 2.2 10*3/uL (ref 0.7–4.0)
Lymphocytes Relative: 35 % (ref 12.0–46.0)
MCHC: 33.5 g/dL (ref 30.0–36.0)
MCV: 89.8 fl (ref 78.0–100.0)
MONO ABS: 0.7 10*3/uL (ref 0.1–1.0)
Monocytes Relative: 11.5 % (ref 3.0–12.0)
Neutro Abs: 3.2 10*3/uL (ref 1.4–7.7)
Neutrophils Relative %: 50 % (ref 43.0–77.0)
Platelets: 253 10*3/uL (ref 150.0–400.0)
RBC: 5.34 Mil/uL (ref 4.22–5.81)
RDW: 13.5 % (ref 11.5–15.5)
WBC: 6.3 10*3/uL (ref 4.0–10.5)

## 2014-01-07 LAB — HEPATIC FUNCTION PANEL
ALBUMIN: 3.3 g/dL — AB (ref 3.5–5.2)
ALT: 25 U/L (ref 0–53)
AST: 23 U/L (ref 0–37)
Alkaline Phosphatase: 59 U/L (ref 39–117)
Bilirubin, Direct: 0.1 mg/dL (ref 0.0–0.3)
Total Bilirubin: 0.9 mg/dL (ref 0.2–1.2)
Total Protein: 7 g/dL (ref 6.0–8.3)

## 2014-01-07 LAB — PSA: PSA: 1.2 ng/mL (ref 0.10–4.00)

## 2014-01-07 LAB — LIPID PANEL
CHOLESTEROL: 215 mg/dL — AB (ref 0–200)
HDL: 46.5 mg/dL (ref >39.00)
LDL CALC: 143 mg/dL — AB (ref 0–99)
NONHDL: 168.5
Total CHOL/HDL Ratio: 5
Triglycerides: 129 mg/dL (ref 0.0–149.0)
VLDL: 25.8 mg/dL (ref 0.0–40.0)

## 2014-01-07 LAB — TSH: TSH: 5.6 u[IU]/mL — AB (ref 0.35–4.50)

## 2014-01-12 ENCOUNTER — Encounter: Payer: Self-pay | Admitting: Internal Medicine

## 2014-01-12 ENCOUNTER — Ambulatory Visit (INDEPENDENT_AMBULATORY_CARE_PROVIDER_SITE_OTHER): Payer: BC Managed Care – PPO | Admitting: Internal Medicine

## 2014-01-12 VITALS — BP 110/82 | HR 68 | Temp 97.5°F | Ht 76.0 in | Wt 245.0 lb

## 2014-01-12 DIAGNOSIS — E039 Hypothyroidism, unspecified: Secondary | ICD-10-CM | POA: Insufficient documentation

## 2014-01-12 DIAGNOSIS — R938 Abnormal findings on diagnostic imaging of other specified body structures: Secondary | ICD-10-CM

## 2014-01-12 DIAGNOSIS — E785 Hyperlipidemia, unspecified: Secondary | ICD-10-CM

## 2014-01-12 DIAGNOSIS — R7989 Other specified abnormal findings of blood chemistry: Secondary | ICD-10-CM

## 2014-01-12 DIAGNOSIS — Z0000 Encounter for general adult medical examination without abnormal findings: Secondary | ICD-10-CM

## 2014-01-12 DIAGNOSIS — J349 Unspecified disorder of nose and nasal sinuses: Secondary | ICD-10-CM

## 2014-01-12 DIAGNOSIS — R9389 Abnormal findings on diagnostic imaging of other specified body structures: Secondary | ICD-10-CM

## 2014-01-12 DIAGNOSIS — R9089 Other abnormal findings on diagnostic imaging of central nervous system: Secondary | ICD-10-CM | POA: Insufficient documentation

## 2014-01-12 MED ORDER — TELMISARTAN 80 MG PO TABS: 80.0000 mg | ORAL_TABLET | Freq: Every day | ORAL | Status: DC

## 2014-01-12 MED ORDER — ZOLPIDEM TARTRATE 10 MG PO TABS: 10.0000 mg | ORAL_TABLET | Freq: Every evening | ORAL | Status: DC | PRN

## 2014-01-12 NOTE — Assessment & Plan Note (Signed)
Likely early mild low thyroid?  delcines tx for now, asympt

## 2014-01-12 NOTE — Assessment & Plan Note (Signed)
D/w pt, declines statin, to cont diet

## 2014-01-12 NOTE — Assessment & Plan Note (Signed)

## 2014-01-12 NOTE — Patient Instructions (Signed)
Please continue all other medications as before, and refills have been done if requested.  Please have the pharmacy call with any other refills you may need.  Please continue your efforts at being more active, low cholesterol diet, and weight control.  You are otherwise up to date with prevention measures today.  Please keep your appointments with your specialists as you may have planned  You will be contacted regarding the referral for: ENT - Dr Thornell Mule  Your blood work was OK today  Please return in 1 year for your yearly visit, or sooner if needed, with Lab testing done 3-5 days before

## 2014-01-12 NOTE — Assessment & Plan Note (Signed)
With ? Sinus obstruction noted may 2015 scan in [place], asympt, but for referral Dr Anselmo Pickler per pt request

## 2014-01-12 NOTE — Progress Notes (Signed)
Subjective:    Patient ID: Brad Ray, male    DOB: 1960/10/01, 53 y.o.   MRN: [id number]  HPI   Here for wellness and f/u;  Overall doing ok;  Pt denies CP, worsening SOB, DOE, wheezing, orthopnea, PND, worsening LE edema, palpitations, dizziness or syncope.  Pt denies neurological change such as new headache, facial or extremity weakness.  Pt denies polydipsia, polyuria, or low sugar symptoms. Pt states overall good compliance with treatment and medications, good tolerability, and has been trying to follow lower cholesterol diet.  Pt denies worsening depressive symptoms, suicidal ideation or panic. No fever, night sweats, wt loss, loss of appetite, or other constitutional symptoms.  Pt states good ability with ADL's, has low fall risk, home safety reviewed and adequate, no other significant changes in hearing or vision, and only occasionally active with exercise due to ongoing bilat knee pain, played [employer] ball from 86-94. Past Medical History  Diagnosis Date  . Abdominal pain, right upper quadrant 12/09/2007  . ALLERGIC RHINITIS 03/19/2007  . DEGENERATIVE JOINT DISEASE, LUMBAR SPINE 12/23/2006  . [place] DISEASE, CERVICAL 12/23/2006  . FATIGUE 04/28/2009  . GERD 12/23/2006  . HYPERCHOLESTEROLEMIA 12/23/2006  . HYPERLIPIDEMIA 03/19/2007  . HYPERSOMNIA 04/28/2009  . HYPERTENSION 03/19/2007  . HYPOTENSION 04/13/2009  . INSOMNIA, HX OF 12/23/2006  . LIVER MASS 12/11/2007  . MENIERE'S DISEASE 12/23/2006  . OTITIS MEDIA, LEFT 04/13/2009  . PERFORATION, TYMPANIC MEMBRANE NOS 12/23/2006   Past Surgical History  Procedure Laterality Date  . Left knee (mcl) repair    . S/p left ear surgury for vertigo    . S/p left knee medial collateral ligament damage      reports that he has never smoked. He has never used smokeless tobacco. He reports that he drinks alcohol. He reports that he does not use illicit drugs. family history includes Alcohol abuse in his other; Diabetes in his father; Heart disease in his  father; Hypertension in his father. There is no history of Colon cancer, Pancreatic cancer, or Stomach cancer. Allergies  Allergen Reactions  . Penicillins     Childhood reaction   Current Outpatient Prescriptions on File Prior to Visit  Medication Sig Dispense Refill  . Multiple Vitamins-Minerals (CENTRUM SILVER PO) Take 1 each by mouth daily.      . vitamin B-12 (CYANOCOBALAMIN) 500 MCG tablet Take 500 mcg by mouth daily.      . vitamin C (ASCORBIC ACID) 500 MG tablet Take 1,000 mg by mouth daily.      Marland Kitchen zolpidem (AMBIEN) 10 MG tablet TAKE 1 TABLET AT BEDTIME AS NEEDED FOR SLEEP 30 tablet 5   No current facility-administered medications on file prior to visit.   Currently participating in a [employer] study regarding closed head injury related to his [employer] career Review of Systems Constitutional: Negative for increased diaphoresis, other activity, appetite or other siginficant weight change  HENT: Negative for worsening hearing loss, ear pain, facial swelling, mouth sores and neck stiffness.   Eyes: Negative for other worsening pain, redness or visual disturbance.  Respiratory: Negative for shortness of breath and wheezing.   Cardiovascular: Negative for chest pain and palpitations.  Gastrointestinal: Negative for diarrhea, blood in stool, abdominal distention or other pain Genitourinary: Negative for hematuria, flank pain or change in urine volume.  Musculoskeletal: Negative for myalgias or other joint complaints.  Skin: Negative for color change and wound.  Neurological: Negative for syncope and numbness. other than noted Hematological: Negative for adenopathy. or other swelling Psychiatric/Behavioral:  Negative for hallucinations, self-injury, decreased concentration or other worsening agitation.      Objective:   Physical Exam BP 110/82 mmHg  Pulse 68  Temp(Src) 97.5 F (36.4 C) (Oral)  Ht 6\' 4"  (1.93 m)  Wt 245 lb (111.131 kg)  BMI 29.83 kg/m2  SpO2 96% VS noted,    Constitutional: Pt is oriented to person, place, and time. Appears well-developed and well-nourished.  Head: Normocephalic and atraumatic.  Right Ear: External ear normal.  Left Ear: External ear normal.  Nose: Nose normal.  Mouth/Throat: Oropharynx is clear and moist.  Eyes: Conjunctivae and EOM are normal. Pupils are equal, round, and reactive to light.  Neck: Normal range of motion. Neck supple. No JVD present. No tracheal deviation present.  Cardiovascular: Normal rate, regular rhythm, normal heart sounds and intact distal pulses.   Pulmonary/Chest: Effort normal and breath sounds without rales or wheezing  Abdominal: Soft. Bowel sounds are normal. NT. No HSM  Musculoskeletal: Normal range of motion. Exhibits no edema.  Lymphadenopathy:  Has no cervical adenopathy.  Neurological: Pt is alert and oriented to person, place, and time. Pt has normal reflexes. No cranial nerve deficit. Motor grossly intact Bilat knees marked crepitus Skin: Skin is warm and dry. No rash noted.  Psychiatric:  Has normal mood and affect. Behavior is normal.     Assessment & Plan:

## 2014-01-12 NOTE — Progress Notes (Signed)
Pre visit review using our clinic review tool, if applicable. No additional management support is needed unless otherwise documented below in the visit note. 

## 2014-03-22 ENCOUNTER — Encounter: Payer: BC Managed Care – PPO | Admitting: Internal Medicine

## 2014-06-15 ENCOUNTER — Other Ambulatory Visit: Payer: Self-pay | Admitting: Internal Medicine

## 2014-06-16 NOTE — Telephone Encounter (Signed)
[facility] 

## 2014-06-16 NOTE — Telephone Encounter (Signed)
[place] for [employer], and [place]  Valium has not been rx here before, should contact original prescriber

## 2014-10-19 ENCOUNTER — Ambulatory Visit (INDEPENDENT_AMBULATORY_CARE_PROVIDER_SITE_OTHER): Payer: BLUE CROSS/BLUE SHIELD | Admitting: Internal Medicine

## 2014-10-19 ENCOUNTER — Encounter: Payer: Self-pay | Admitting: Internal Medicine

## 2014-10-19 VITALS — BP 116/82 | HR 64 | Temp 97.9°F | Ht 76.0 in | Wt 249.0 lb

## 2014-10-19 DIAGNOSIS — I10 Essential (primary) hypertension: Secondary | ICD-10-CM | POA: Diagnosis not present

## 2014-10-19 DIAGNOSIS — M5412 Radiculopathy, cervical region: Secondary | ICD-10-CM | POA: Diagnosis not present

## 2014-10-19 DIAGNOSIS — M4802 Spinal stenosis, cervical region: Secondary | ICD-10-CM | POA: Insufficient documentation

## 2014-10-19 MED ORDER — GABAPENTIN 100 MG PO CAPS: 100.0000 mg | ORAL_CAPSULE | Freq: Three times a day (TID) | ORAL | Status: DC

## 2014-10-19 MED ORDER — GABAPENTIN 300 MG PO CAPS: 300.0000 mg | ORAL_CAPSULE | Freq: Three times a day (TID) | ORAL | Status: DC

## 2014-10-19 NOTE — Assessment & Plan Note (Signed)
stable overall by history and exam, recent data reviewed with pt, and pt to continue medical treatment as before,  to f/u any worsening symptoms or concerns BP Readings from Last 3 Encounters:  10/19/14 116/82  01/12/14 110/82  05/12/13 113/74

## 2014-10-19 NOTE — Patient Instructions (Signed)
Please take all new medication as prescribed - the gabapentin at 100 mg three times per day for 7 days, then 300 mg three times per day after that (and you can take 600 mg instead of the last 300 mg of the day if you want if helps with sleep)  You will be contacted regarding the referral for: MRI cervical spine, and Raliegh Ip referral  Please continue all other medications as before, and refills have been done if requested.  Please have the pharmacy call with any other refills you may need.  Please keep your appointments with your specialists as you may have planned

## 2014-10-19 NOTE — Progress Notes (Signed)
Pre visit review using our clinic review tool, if applicable. No additional management support is needed unless otherwise documented below in the visit note. 

## 2014-10-19 NOTE — Progress Notes (Signed)
Subjective:    Patient ID: Brad Ray, male    DOB: 1960/05/03, 54 y.o.   MRN: [id number]  HPI  Here with 2 mo collarbone area pain, then more recently right neck x 2 -3 wks, with some radiation to the right shoulder, constant, mod to occas severe, dull and burning, some stiffness after sitting only 30 min, lying on right side makes it worse, Does not have numbness or weakness, still does some wt lifting with increased pain to the area but no perceived lack of strength, lift recently twice per wk, bench press up to 275 lbs recently.  Has some knee arthritis, but no LE pain.weakness/numbness .  Has hx of cspine straightening, also known lumbar degenerative changes. Past Medical History  Diagnosis Date  . Abdominal pain, right upper quadrant 12/09/2007  . ALLERGIC RHINITIS 03/19/2007  . DEGENERATIVE JOINT DISEASE, LUMBAR SPINE 12/23/2006  . [place] DISEASE, CERVICAL 12/23/2006  . FATIGUE 04/28/2009  . GERD 12/23/2006  . HYPERCHOLESTEROLEMIA 12/23/2006  . HYPERLIPIDEMIA 03/19/2007  . HYPERSOMNIA 04/28/2009  . HYPERTENSION 03/19/2007  . HYPOTENSION 04/13/2009  . INSOMNIA, HX OF 12/23/2006  . LIVER MASS 12/11/2007  . MENIERE'S DISEASE 12/23/2006  . OTITIS MEDIA, LEFT 04/13/2009  . PERFORATION, TYMPANIC MEMBRANE NOS 12/23/2006   Past Surgical History  Procedure Laterality Date  . Left knee (mcl) repair    . S/p left ear surgury for vertigo    . S/p left knee medial collateral ligament damage      reports that he has never smoked. He has never used smokeless tobacco. He reports that he drinks alcohol. He reports that he does not use illicit drugs. family history includes Alcohol abuse in his other; Diabetes in his father; Heart disease in his father; Hypertension in his father. There is no history of Colon cancer, Pancreatic cancer, or Stomach cancer. Allergies  Allergen Reactions  . Penicillins     Childhood reaction   Current Outpatient Prescriptions on File Prior to Visit  Medication Sig  Dispense Refill  . telmisartan (MICARDIS) 80 MG tablet Take 1 tablet (80 mg total) by mouth daily. 90 tablet 3  . zolpidem (AMBIEN) 10 MG tablet TAKE 1 TABLET AT BEDTIME AS NEEDED FOR SLEEP 90 tablet 1  . ANDROGEL 50 MG/5GM (1%) GEL PLACE 5GM ONTO THE SKIN DAILY (Patient not taking: Reported on 10/19/2014) 90 Package 1  . Multiple Vitamins-Minerals (CENTRUM SILVER PO) Take 1 each by mouth daily.      . vitamin B-12 (CYANOCOBALAMIN) 500 MCG tablet Take 500 mcg by mouth daily.      . vitamin C (ASCORBIC ACID) 500 MG tablet Take 1,000 mg by mouth daily.       No current facility-administered medications on file prior to visit.   Review of Systems  Constitutional: Negative for unusual diaphoresis or night sweats HENT: Negative for ringing in ear or discharge Eyes: Negative for double vision or worsening visual disturbance.  Respiratory: Negative for choking and stridor.   Gastrointestinal: Negative for vomiting or other signifcant bowel change Genitourinary: Negative for hematuria or change in urine volume.  Musculoskeletal: Negative for other MSK pain or swelling Skin: Negative for color change and worsening wound.  Neurological: Negative for tremors and numbness other than noted  Psychiatric/Behavioral: Negative for decreased concentration or agitation other than above       Objective:   Physical Exam BP 116/82 mmHg  Pulse 64  Temp(Src) 97.9 F (36.6 C) (Oral)  Ht 6\' 4"  (1.93 m)  Wt 249  lb (112.946 kg)  BMI 30.32 kg/m2  SpO2 95% VS noted,  Constitutional: Pt appears in no significant distress HENT: Head: NCAT.  Right Ear: External ear normal.  Left Ear: External ear normal.  Eyes: . Pupils are equal, round, and reactive to light. Conjunctivae and EOM are normal Neck: Normal range of motion. Neck supple.  Cardiovascular: Normal rate and regular rhythm.   Pulmonary/Chest: Effort normal and breath sounds without rales or wheezing.  Neurological: Pt is alert. Not confused , motor  4+5RUE with decreased sensation to LT at c-5-6 distribution, dtr intact  Skin: Skin is warm. No rash, no LE edema Psychiatric: Pt behavior is normal. No agitation.      Assessment & Plan:

## 2014-10-19 NOTE — Assessment & Plan Note (Signed)
Mild to mod but with subtle neuro changes, for MRi cervical spine, and refer orthopedic, also for gabapentin trial, declines other tx for now

## 2014-10-27 ENCOUNTER — Telehealth: Payer: Self-pay | Admitting: Internal Medicine

## 2014-10-27 NOTE — Telephone Encounter (Signed)
Pt advised in detail but insisted that both areas be MRI. After discussing this with patient for several minutes and offering a referral to Ortho for second opinion or OV with Dr Tamala Julian per Dr Jenny Reichmann, pt was transferred to speak with Practice Adm Lebron Conners

## 2014-10-27 NOTE — Telephone Encounter (Signed)
Only cervical spine needed as this is where the nerve pinch might be, pain in the collarbone area is often pain radiating from the neck

## 2014-10-27 NOTE — Telephone Encounter (Signed)
Patient left vm stating his MRI will only look at his cervical spine and he thought you were looking at his R collarbone as well. He would like someone to call him back to discuss. CB# [phone]

## 2014-10-30 ENCOUNTER — Ambulatory Visit
Admission: RE | Admit: 2014-10-30 | Discharge: 2014-10-30 | Disposition: A | Discharge status: Home / Self Care | Payer: BLUE CROSS/BLUE SHIELD | Source: Ambulatory Visit | Attending: Internal Medicine | Admitting: Internal Medicine

## 2014-10-30 DIAGNOSIS — M5412 Radiculopathy, cervical region: Secondary | ICD-10-CM

## 2014-11-22 DIAGNOSIS — M25519 Pain in unspecified shoulder: Secondary | ICD-10-CM | POA: Insufficient documentation

## 2014-12-07 ENCOUNTER — Other Ambulatory Visit: Payer: Self-pay | Admitting: Internal Medicine

## 2014-12-07 NOTE — Telephone Encounter (Signed)
Rx faxed to pharmacy  

## 2014-12-07 NOTE — Telephone Encounter (Signed)
Done hardcopy to Dahlia  

## 2015-03-06 ENCOUNTER — Other Ambulatory Visit: Payer: Self-pay | Admitting: Internal Medicine

## 2015-06-11 ENCOUNTER — Other Ambulatory Visit: Payer: Self-pay | Admitting: Internal Medicine

## 2015-06-13 NOTE — Telephone Encounter (Signed)
Done hardcopy to Corinne  

## 2015-06-14 NOTE — Telephone Encounter (Signed)
Medication has been sent to pharmacy patient is aware 

## 2015-08-02 ENCOUNTER — Telehealth: Payer: Self-pay | Admitting: Internal Medicine

## 2015-08-02 DIAGNOSIS — Z0000 Encounter for general adult medical examination without abnormal findings: Secondary | ICD-10-CM

## 2015-08-02 NOTE — Telephone Encounter (Signed)
Patient has a cpe on 08/08/2015. Requesting orders for blood work to have done before the visit

## 2015-08-02 NOTE — Telephone Encounter (Signed)
Orders placed.

## 2015-08-03 ENCOUNTER — Other Ambulatory Visit (INDEPENDENT_AMBULATORY_CARE_PROVIDER_SITE_OTHER): Payer: BLUE CROSS/BLUE SHIELD

## 2015-08-03 DIAGNOSIS — Z0000 Encounter for general adult medical examination without abnormal findings: Secondary | ICD-10-CM

## 2015-08-03 LAB — TSH: TSH: 5.26 u[IU]/mL — ABNORMAL HIGH (ref 0.35–4.50)

## 2015-08-03 LAB — PSA: PSA: 0.95 ng/mL (ref 0.10–4.00)

## 2015-08-03 LAB — CBC WITH DIFFERENTIAL/PLATELET
BASOS ABS: 0 10*3/uL (ref 0.0–0.1)
Basophils Relative: 0.2 % (ref 0.0–3.0)
Eosinophils Absolute: 0.2 10*3/uL (ref 0.0–0.7)
Eosinophils Relative: 3 % (ref 0.0–5.0)
HEMATOCRIT: 49.8 % (ref 39.0–52.0)
Hemoglobin: 17.1 g/dL — ABNORMAL HIGH (ref 13.0–17.0)
LYMPHS PCT: 41.7 % (ref 12.0–46.0)
Lymphs Abs: 2.7 10*3/uL (ref 0.7–4.0)
MCHC: 34.3 g/dL (ref 30.0–36.0)
MCV: 88.7 fl (ref 78.0–100.0)
MONOS PCT: 10.4 % (ref 3.0–12.0)
Monocytes Absolute: 0.7 10*3/uL (ref 0.1–1.0)
NEUTROS ABS: 2.8 10*3/uL (ref 1.4–7.7)
Neutrophils Relative %: 44.7 % (ref 43.0–77.0)
Platelets: 262 10*3/uL (ref 150.0–400.0)
RBC: 5.61 Mil/uL (ref 4.22–5.81)
RDW: 13.1 % (ref 11.5–15.5)
WBC: 6.4 10*3/uL (ref 4.0–10.5)

## 2015-08-03 LAB — HEPATIC FUNCTION PANEL
ALBUMIN: 4.2 g/dL (ref 3.5–5.2)
ALK PHOS: 58 U/L (ref 39–117)
ALT: 18 U/L (ref 0–53)
AST: 15 U/L (ref 0–37)
Bilirubin, Direct: 0.1 mg/dL (ref 0.0–0.3)
TOTAL PROTEIN: 7.3 g/dL (ref 6.0–8.3)
Total Bilirubin: 0.7 mg/dL (ref 0.2–1.2)

## 2015-08-03 LAB — LIPID PANEL
CHOLESTEROL: 232 mg/dL — AB (ref 0–200)
HDL: 39.7 mg/dL (ref >39.00)
LDL Cholesterol: 153 mg/dL — ABNORMAL HIGH (ref 0–99)
NonHDL: 191.87
TRIGLYCERIDES: 195 mg/dL — AB (ref 0.0–149.0)
Total CHOL/HDL Ratio: 6
VLDL: 39 mg/dL (ref 0.0–40.0)

## 2015-08-03 LAB — BASIC METABOLIC PANEL
BUN: 13 mg/dL (ref 6–23)
CALCIUM: 9.4 mg/dL (ref 8.4–10.5)
CO2: 29 mEq/L (ref 19–32)
CREATININE: 1 mg/dL (ref 0.40–1.50)
Chloride: 105 mEq/L (ref 96–112)
GFR: 83.11 mL/min (ref >60.00)
Glucose, Bld: 93 mg/dL (ref 70–99)
Potassium: 4.4 mEq/L (ref 3.5–5.1)
Sodium: 139 mEq/L (ref 135–145)

## 2015-08-03 LAB — HEMOGLOBIN A1C: Hgb A1c MFr Bld: 5.3 % (ref 4.6–6.5)

## 2015-08-03 LAB — MICROALBUMIN / CREATININE URINE RATIO
CREATININE, U: 378.4 mg/dL
MICROALB UR: 1.3 mg/dL (ref 0.0–1.9)
Microalb Creat Ratio: 0.3 mg/g (ref 0.0–30.0)

## 2015-08-08 ENCOUNTER — Other Ambulatory Visit: Payer: BLUE CROSS/BLUE SHIELD

## 2015-08-08 ENCOUNTER — Telehealth: Payer: Self-pay

## 2015-08-08 ENCOUNTER — Ambulatory Visit (INDEPENDENT_AMBULATORY_CARE_PROVIDER_SITE_OTHER): Payer: BLUE CROSS/BLUE SHIELD | Admitting: Internal Medicine

## 2015-08-08 ENCOUNTER — Encounter: Payer: Self-pay | Admitting: Internal Medicine

## 2015-08-08 VITALS — BP 136/72 | HR 67 | Resp 20 | Wt 249.0 lb

## 2015-08-08 DIAGNOSIS — Z0000 Encounter for general adult medical examination without abnormal findings: Secondary | ICD-10-CM | POA: Diagnosis not present

## 2015-08-08 DIAGNOSIS — Z1159 Encounter for screening for other viral diseases: Secondary | ICD-10-CM

## 2015-08-08 DIAGNOSIS — N529 Male erectile dysfunction, unspecified: Secondary | ICD-10-CM | POA: Insufficient documentation

## 2015-08-08 DIAGNOSIS — R6889 Other general symptoms and signs: Secondary | ICD-10-CM | POA: Diagnosis not present

## 2015-08-08 DIAGNOSIS — Z0001 Encounter for general adult medical examination with abnormal findings: Secondary | ICD-10-CM

## 2015-08-08 DIAGNOSIS — E785 Hyperlipidemia, unspecified: Secondary | ICD-10-CM

## 2015-08-08 DIAGNOSIS — E039 Hypothyroidism, unspecified: Secondary | ICD-10-CM

## 2015-08-08 DIAGNOSIS — R109 Unspecified abdominal pain: Secondary | ICD-10-CM | POA: Insufficient documentation

## 2015-08-08 DIAGNOSIS — R10A1 Flank pain, right side: Secondary | ICD-10-CM | POA: Insufficient documentation

## 2015-08-08 MED ORDER — TADALAFIL 20 MG PO TABS: 20.0000 mg | ORAL_TABLET | Freq: Every day | ORAL | Status: DC | PRN

## 2015-08-08 MED ORDER — LEVOTHYROXINE SODIUM 25 MCG PO TABS: 25.0000 ug | ORAL_TABLET | Freq: Every day | ORAL | Status: DC

## 2015-08-08 NOTE — Patient Instructions (Signed)
Please take all new medication as prescribed - the thyroid medication, and the cialis as needed  Please continue all other medications as before, and refills have been done if requested.  Please have the pharmacy call with any other refills you may need.  Please continue your efforts at being more active, low cholesterol diet, and weight control.  You are otherwise up to date with prevention measures today.  Please keep your appointments with your specialists as you may have planned  You will be contacted regarding the referral for: Kidney ultrasound to make sure no obstruction  Please go to the LAB in the Basement (turn left off the elevator) for the tests to be done today - just the urine testing today  You will be contacted by phone if any changes need to be made immediately.  Otherwise, you will receive a letter about your results with an explanation, but please check with MyChart first.  Please remember to sign up for MyChart if you have not done so, as this will be important to you in the future with finding out test results, communicating by private email, and scheduling acute appointments online when needed.  Please return in 1 year for your yearly visit, or sooner if needed, with Lab testing done 3-5 days before

## 2015-08-08 NOTE — Progress Notes (Signed)
Pre visit review using our clinic review tool, if applicable. No additional management support is needed unless otherwise documented below in the visit note. 

## 2015-08-08 NOTE — Telephone Encounter (Signed)
Orders placed.

## 2015-08-08 NOTE — Progress Notes (Signed)
   Subjective:    Patient ID: Brad Ray, male    DOB: 1961/03/03, 55 y.o.   MRN: [id number]  HPI  Here for wellness and f/u;  Overall doing ok;  Pt denies Chest pain, worsening SOB, DOE, wheezing, orthopnea, PND, worsening LE edema, palpitations, dizziness or syncope.  Pt denies neurological change such as new headache, facial or extremity weakness.  Pt denies polydipsia, polyuria, or low sugar symptoms. Pt states overall good compliance with treatment and medications, good tolerability, and has been trying to follow appropriate diet.  Pt denies worsening depressive symptoms, suicidal ideation or panic. No fever, night sweats, wt loss, loss of appetite, or other constitutional symptoms.  Pt states good ability with ADL's, has low fall risk, home safety reviewed and adequate, no other significant changes in hearing or vision, and only occasionally active with exercise.  Wt Readings from Last 3 Encounters:  08/08/15 249 lb (112.946 kg)  10/19/14 249 lb (112.946 kg)  01/12/14 245 lb (111.131 kg)   Pt continues to have acute onset right LBP with radiation to the rlq assoc with colicky type pain, sometimes severe over 3 days, resolved with an episode of painful urination one night, no fever , and not recurred since then, no BRB.. But no bowel or bladder change, fever, wt loss,  worsening LE pain/numbness/weakness, gait change or falls.For some reason UA not done with labs. Denies hyper or hypo thyroid symptoms such as voice, skin or hair change.  Not really trying to follow lower chol diet, working very hard.  Also with mild worsening ED symptoms in the past 6 mo, Denies worsening depressive symptoms, suicidal ideation, or panic.    Review of Systems     Objective:   Physical Exam        Assessment & Plan:

## 2015-08-10 ENCOUNTER — Other Ambulatory Visit: Payer: Self-pay | Admitting: Internal Medicine

## 2015-08-10 LAB — CULTURE, URINE COMPREHENSIVE
COLONY COUNT: NO GROWTH
ORGANISM ID, BACTERIA: NO GROWTH

## 2015-08-11 ENCOUNTER — Other Ambulatory Visit: Payer: BLUE CROSS/BLUE SHIELD

## 2015-08-13 NOTE — Assessment & Plan Note (Signed)

## 2015-08-13 NOTE — Addendum Note (Signed)
Addended by: Biagio Borg on: 08/13/2015 04:53 PM   Modules accepted: Orders

## 2015-08-13 NOTE — Assessment & Plan Note (Signed)
Mild to mod, for cialis prn,  to f/u any worsening symptoms or concerns

## 2015-08-13 NOTE — Assessment & Plan Note (Signed)
High suspicion renal colic, for UA today, also renal u/s - r/o obstruction,  to f/u any worsening symptoms or concerns, declines urology referral

## 2015-08-13 NOTE — Assessment & Plan Note (Signed)
Mild elev TSH confirmed, for new synthroid .25 mcg qd,  to f/u any worsening symptoms or concerns

## 2015-08-13 NOTE — Assessment & Plan Note (Signed)
Lab Results  Component Value Date   LDLCALC 153* 08/03/2015   Uncontrolled, for lower chol diet, decliens statin after d/w pt for reduced CV risk

## 2015-08-15 ENCOUNTER — Ambulatory Visit
Admission: RE | Admit: 2015-08-15 | Discharge: 2015-08-15 | Disposition: A | Discharge status: Home / Self Care | Payer: BLUE CROSS/BLUE SHIELD | Source: Ambulatory Visit | Attending: Internal Medicine | Admitting: Internal Medicine

## 2015-08-15 DIAGNOSIS — R109 Unspecified abdominal pain: Secondary | ICD-10-CM

## 2015-10-10 ENCOUNTER — Other Ambulatory Visit: Payer: Self-pay | Admitting: Internal Medicine

## 2015-12-10 ENCOUNTER — Other Ambulatory Visit: Payer: Self-pay | Admitting: Internal Medicine

## 2015-12-12 NOTE — Telephone Encounter (Signed)
Done hardcopy to Corinne  

## 2015-12-12 NOTE — Telephone Encounter (Signed)
[facility] 

## 2015-12-28 DIAGNOSIS — M5441 Lumbago with sciatica, right side: Secondary | ICD-10-CM | POA: Diagnosis not present

## 2016-01-02 DIAGNOSIS — M545 Low back pain: Secondary | ICD-10-CM | POA: Diagnosis not present

## 2016-01-10 DIAGNOSIS — M545 Low back pain: Secondary | ICD-10-CM | POA: Diagnosis not present

## 2016-01-10 DIAGNOSIS — M5126 Other intervertebral disc displacement, lumbar region: Secondary | ICD-10-CM | POA: Diagnosis not present

## 2016-02-03 ENCOUNTER — Ambulatory Visit (INDEPENDENT_AMBULATORY_CARE_PROVIDER_SITE_OTHER): Payer: BLUE CROSS/BLUE SHIELD | Admitting: Family Medicine

## 2016-02-03 ENCOUNTER — Encounter: Payer: Self-pay | Admitting: Family Medicine

## 2016-02-03 VITALS — BP 130/88 | HR 88 | Temp 97.5°F | Resp 14 | Ht 76.0 in | Wt 247.0 lb

## 2016-02-03 DIAGNOSIS — R0789 Other chest pain: Secondary | ICD-10-CM

## 2016-02-03 NOTE — Patient Instructions (Signed)
It was very nice to see you today- your evaluation and EKG is reassuring.  I suspect that your symptoms were caused by thanksgiving meal and heartburn, in addition to being off your usual blood pressure medication.  If you have any recurrent of your symptoms or concerns however please do seek care.

## 2016-02-03 NOTE — Progress Notes (Addendum)
[facility] [address], [place], [place] [address] [phone]  Date:  02/03/2016   Name:  Brad Ray   DOB:  [birth year]   MRN:  [id number]  PCP:  Cathlean Cower, MD    Chief Complaint: No chief complaint on file.   History of Present Illness:  Brad Ray is a 55 y.o. very pleasant male patient who presents with the following:  Here today with concern about indigestion/ heart burn that occurred this past Thursday (Thanksgiving)- it felt different than GERD he has noted in the past. Occurred after he had eaten a large thanksgiving meal and had dessert. This lasted maybe 2.5 to 3 hours; he used some OTC prilosec, tums, and it eventually went away.  It was in the epigastic area, felt like a "knot." He had sweating and vomited once.  No SOB. He has has not had recurrence of his sx since thanksgiving.   His wife checked his BP while he was symptomtic and it was 160/104.  He also noted that instead of taking his micardis he has been accidentlay taking his wife's naproxen.  He had been doing this for 4-5 days; he noted this mistake and went back on his micardis 2 days ago He has HTN, never had any other cardiac history.   He did have an exercise stress test in 2015. This was normal  He normally is very active at his job- he walks a lot at work, he has jogged a couple of times this week and did not have any CP  He is currently feeling fine but wanted to make sure that his BP was back under control and would also like to have an EKG  He got a higher reading on his home BP cuff this am (he thinks about 140/100) and is not sure how accurate it is  BP Readings from Last 3 Encounters:  02/03/16 130/88  08/08/15 136/72  10/19/14 116/82     Patient Active Problem List   Diagnosis Date Noted  . Erectile dysfunction 08/08/2015  . Right flank pain 08/08/2015  . Cervical radiculopathy 10/19/2014  . Abnormal MRI 01/12/2014  .  Hypothyroidism 01/12/2014  . Hypogonadism male 03/17/2013  . Impaired glucose tolerance 03/17/2013  . Conjunctivitis of both eyes 03/13/2012  . Encounter for preventative adult health care exam with abnormal findings 09/09/2010  . HYPERSOMNIA 04/28/2009  . FATIGUE 04/28/2009  . Hyperlipidemia 03/19/2007  . Essential hypertension 03/19/2007  . ALLERGIC RHINITIS 03/19/2007  . PERFORATION, TYMPANIC MEMBRANE NOS 12/23/2006  . MENIERE'S DISEASE 12/23/2006  . GERD 12/23/2006  . DEGENERATIVE JOINT DISEASE, LUMBAR SPINE 12/23/2006  . [place] DISEASE, CERVICAL 12/23/2006  . INSOMNIA, HX OF 12/23/2006    Past Medical History:  Diagnosis Date  . Abdominal pain, right upper quadrant 12/09/2007  . ALLERGIC RHINITIS 03/19/2007  . DEGENERATIVE JOINT DISEASE, LUMBAR SPINE 12/23/2006  . [place] DISEASE, CERVICAL 12/23/2006  . FATIGUE 04/28/2009  . GERD 12/23/2006  . HYPERCHOLESTEROLEMIA 12/23/2006  . HYPERLIPIDEMIA 03/19/2007  . HYPERSOMNIA 04/28/2009  . HYPERTENSION 03/19/2007  . HYPOTENSION 04/13/2009  . INSOMNIA, HX OF 12/23/2006  . LIVER MASS 12/11/2007  . MENIERE'S DISEASE 12/23/2006  . OTITIS MEDIA, LEFT 04/13/2009  . PERFORATION, TYMPANIC MEMBRANE NOS 12/23/2006    Past Surgical History:  Procedure Laterality Date  . left knee (MCL) repair    . s/p left ear surgury for vertigo    . s/p left knee medial collateral  ligament damage      Social History  Substance Use Topics  . Smoking status: Never Smoker  . Smokeless tobacco: Never Used  . Alcohol use Yes     Comment: weekly beer    Family History  Problem Relation Age of Onset  . Diabetes Father   . Hypertension Father   . Heart disease Father   . Alcohol abuse Other   . Colon cancer Neg Hx   . Pancreatic cancer Neg Hx   . Stomach cancer Neg Hx     Allergies  Allergen Reactions  . Penicillins     Childhood reaction    Medication list has been reviewed and updated.  Current Outpatient Prescriptions on File Prior to Visit   Medication Sig Dispense Refill  . gabapentin (NEURONTIN) 100 MG capsule Take 1 capsule (100 mg total) by mouth 3 (three) times daily. To start now 21 capsule 0  . gabapentin (NEURONTIN) 300 MG capsule Take 1 capsule (300 mg total) by mouth 3 (three) times daily. To start sept 16, 2016 90 capsule 3  . levothyroxine (SYNTHROID, LEVOTHROID) 25 MCG tablet Take 1 tablet (25 mcg total) by mouth daily before breakfast. 90 tablet 3  . Multiple Vitamins-Minerals (CENTRUM SILVER PO) Take 1 each by mouth daily.      . tadalafil (CIALIS) 20 MG tablet Take 1 tablet (20 mg total) by mouth daily as needed for erectile dysfunction. 10 tablet 11  . telmisartan (MICARDIS) 80 MG tablet TAKE 1 TABLET (80 MG TOTAL) BY MOUTH DAILY. 90 tablet 1  . testosterone (ANDROGEL) 50 MG/5GM (1%) GEL APPLY 5GM ONTO THE SKIN DAILY 450 g 1  . vitamin B-12 (CYANOCOBALAMIN) 500 MCG tablet Take 500 mcg by mouth daily.      . vitamin C (ASCORBIC ACID) 500 MG tablet Take 1,000 mg by mouth daily.      Marland Kitchen zolpidem (AMBIEN) 10 MG tablet TAKE 1 TABLET BY MOUTH AT BEDTIME FOR SLEEP 90 tablet 1   No current facility-administered medications on file prior to visit.     Review of Systems:  As per HPI- otherwise negative.   Physical Examination: Vitals:   02/03/16 1118  BP: 130/88  Pulse: 88  Resp: 14  Temp: 97.5 F (36.4 C)   Ideal Body Weight:    GEN: WDWN, NAD, Non-toxic, A & O x 3, looks well, athletic build HEENT: Atraumatic, Normocephalic. Neck supple. No masses, No LAD. Ears and Nose: No external deformity. CV: RRR, No M/G/R. No JVD. No thrill. No extra heart sounds. PULM: CTA B, no wheezes, crackles, rhonchi. No retractions. No resp. distress. No accessory muscle use. ABD: S, NT, ND, +BS. No rebound. No HSM.  No RUQ tenderness and negative murphy's  EXTR: No c/c/e NEURO Normal gait.  PSYCH: Normally interactive. Conversant. Not depressed or anxious appearing.  Calm demeanor.   EKG: NSR, no ST elevation or  depression, no significant change since last EKG in 2015 Assessment and Plan: Other chest pain - Plan: EKG 12-Lead  Here today to follow-up an episode of epigastric pain that occurred after eating a large meal 2 days ago,  He also was accidentally taking an NSAID instead of his micardis.  His sx are resolved and BP is back to normal.  Discussed in detail with pt; he certainly would have reason to have GERD sx and his story is c/w GERD.  It is also possible - although less likely) that he could have CAD; I am glad to have him follow-up  with cardiology.  He declines for the time being but will let us know if any recurrent sx, and will also watch for any more subtle signs such as decreased exercise tolerance.  If these occur he will seek care right away   Signed Lamar Blinks, MD

## 2016-04-03 ENCOUNTER — Encounter: Payer: Self-pay | Admitting: Gastroenterology

## 2016-04-07 ENCOUNTER — Other Ambulatory Visit: Payer: Self-pay | Admitting: Internal Medicine

## 2016-05-09 DIAGNOSIS — H8103 Meniere's disease, bilateral: Secondary | ICD-10-CM | POA: Diagnosis not present

## 2016-06-03 ENCOUNTER — Telehealth: Payer: Self-pay | Admitting: *Deleted

## 2016-06-03 ENCOUNTER — Other Ambulatory Visit: Payer: Self-pay | Admitting: Internal Medicine

## 2016-06-03 NOTE — Telephone Encounter (Signed)
Pt left msg on triage needing to get refill on his ambien sent to [facility].../lmb

## 2016-06-04 NOTE — Telephone Encounter (Signed)
I believe This was Done hardcopy to [employer] earlier today

## 2016-06-04 NOTE — Telephone Encounter (Signed)
Notified pt rx has been faxed to [facility].../lmb

## 2016-06-04 NOTE — Telephone Encounter (Signed)
Faxed to [facility]

## 2016-06-04 NOTE — Telephone Encounter (Signed)
Done hardcopy to [facility]  

## 2016-06-26 ENCOUNTER — Other Ambulatory Visit: Payer: Self-pay | Admitting: Internal Medicine

## 2016-06-26 NOTE — Telephone Encounter (Signed)
Done hardcopy to [facility]  

## 2016-06-27 NOTE — Telephone Encounter (Signed)
Called pt via personal cell phone, LVM letting him know phone system issue and script was ready for pick up at front desk.

## 2016-08-15 ENCOUNTER — Other Ambulatory Visit: Payer: Self-pay | Admitting: Internal Medicine

## 2016-08-27 ENCOUNTER — Other Ambulatory Visit: Payer: Self-pay | Admitting: Internal Medicine

## 2016-08-27 NOTE — Telephone Encounter (Signed)
Pt called stating he needs this before Friday he is going out of town

## 2016-08-27 NOTE — Telephone Encounter (Signed)
Done hardcopy to [facility]  

## 2016-08-27 NOTE — Telephone Encounter (Signed)
Faxed

## 2016-10-03 ENCOUNTER — Other Ambulatory Visit: Payer: Self-pay | Admitting: Internal Medicine

## 2016-10-18 ENCOUNTER — Telehealth: Payer: Self-pay | Admitting: Internal Medicine

## 2016-10-18 DIAGNOSIS — Z125 Encounter for screening for malignant neoplasm of prostate: Secondary | ICD-10-CM

## 2016-10-18 DIAGNOSIS — Z0001 Encounter for general adult medical examination with abnormal findings: Secondary | ICD-10-CM

## 2016-10-18 NOTE — Telephone Encounter (Signed)
MD had already entered labs, but order expired on 09/16/16. Re-entered standing CPX labs...Brad Ray

## 2016-10-18 NOTE — Telephone Encounter (Signed)
Patient requesting labs to be entered so he can have done before CPE on 8/23.  Please notify once entered.

## 2016-10-25 ENCOUNTER — Other Ambulatory Visit (INDEPENDENT_AMBULATORY_CARE_PROVIDER_SITE_OTHER): Payer: BLUE CROSS/BLUE SHIELD

## 2016-10-25 DIAGNOSIS — Z0001 Encounter for general adult medical examination with abnormal findings: Secondary | ICD-10-CM

## 2016-10-25 DIAGNOSIS — Z125 Encounter for screening for malignant neoplasm of prostate: Secondary | ICD-10-CM | POA: Diagnosis not present

## 2016-10-25 LAB — CBC WITH DIFFERENTIAL/PLATELET
BASOS ABS: 0 10*3/uL (ref 0.0–0.1)
BASOS PCT: 0.6 % (ref 0.0–3.0)
EOS ABS: 0.2 10*3/uL (ref 0.0–0.7)
Eosinophils Relative: 3.9 % (ref 0.0–5.0)
HEMATOCRIT: 48.4 % (ref 39.0–52.0)
HEMOGLOBIN: 16.2 g/dL (ref 13.0–17.0)
LYMPHS PCT: 40.5 % (ref 12.0–46.0)
Lymphs Abs: 2.3 10*3/uL (ref 0.7–4.0)
MCHC: 33.6 g/dL (ref 30.0–36.0)
MCV: 91.4 fl (ref 78.0–100.0)
MONO ABS: 0.7 10*3/uL (ref 0.1–1.0)
Monocytes Relative: 12.1 % — ABNORMAL HIGH (ref 3.0–12.0)
Neutro Abs: 2.5 10*3/uL (ref 1.4–7.7)
Neutrophils Relative %: 42.9 % — ABNORMAL LOW (ref 43.0–77.0)
Platelets: 302 10*3/uL (ref 150.0–400.0)
RBC: 5.29 Mil/uL (ref 4.22–5.81)
RDW: 13.3 % (ref 11.5–15.5)
WBC: 5.7 10*3/uL (ref 4.0–10.5)

## 2016-10-25 LAB — HEPATIC FUNCTION PANEL
ALBUMIN: 3.8 g/dL (ref 3.5–5.2)
ALT: 19 U/L (ref 0–53)
AST: 17 U/L (ref 0–37)
Alkaline Phosphatase: 54 U/L (ref 39–117)
BILIRUBIN TOTAL: 0.6 mg/dL (ref 0.2–1.2)
Bilirubin, Direct: 0.1 mg/dL (ref 0.0–0.3)
TOTAL PROTEIN: 7 g/dL (ref 6.0–8.3)

## 2016-10-25 LAB — LIPID PANEL
CHOL/HDL RATIO: 5
Cholesterol: 203 mg/dL — ABNORMAL HIGH (ref 0–200)
HDL: 44.9 mg/dL (ref >39.00)
LDL CALC: 130 mg/dL — AB (ref 0–99)
NONHDL: 157.78
Triglycerides: 139 mg/dL (ref 0.0–149.0)
VLDL: 27.8 mg/dL (ref 0.0–40.0)

## 2016-10-25 LAB — BASIC METABOLIC PANEL
BUN: 12 mg/dL (ref 6–23)
CHLORIDE: 104 meq/L (ref 96–112)
CO2: 28 mEq/L (ref 19–32)
CREATININE: 1.04 mg/dL (ref 0.40–1.50)
Calcium: 9.2 mg/dL (ref 8.4–10.5)
GFR: 79.06 mL/min (ref >60.00)
Glucose, Bld: 93 mg/dL (ref 70–99)
Potassium: 5.2 mEq/L — ABNORMAL HIGH (ref 3.5–5.1)
Sodium: 140 mEq/L (ref 135–145)

## 2016-10-25 LAB — URINALYSIS, ROUTINE W REFLEX MICROSCOPIC
Bilirubin Urine: NEGATIVE
Hgb urine dipstick: NEGATIVE
KETONES UR: NEGATIVE
Leukocytes, UA: NEGATIVE
Nitrite: NEGATIVE
PH: 6 (ref 5.0–8.0)
RBC / HPF: NONE SEEN (ref 0–?)
Specific Gravity, Urine: <=1.005 — AB (ref 1.000–1.030)
TOTAL PROTEIN, URINE-UPE24: NEGATIVE
UROBILINOGEN UA: 0.2 (ref 0.0–1.0)
Urine Glucose: NEGATIVE
WBC, UA: NONE SEEN (ref 0–?)

## 2016-10-25 LAB — PSA: PSA: 2.42 ng/mL (ref 0.10–4.00)

## 2016-10-25 LAB — TSH: TSH: 3.82 u[IU]/mL (ref 0.35–4.50)

## 2016-10-31 ENCOUNTER — Encounter: Payer: Self-pay | Admitting: Internal Medicine

## 2016-10-31 ENCOUNTER — Ambulatory Visit (INDEPENDENT_AMBULATORY_CARE_PROVIDER_SITE_OTHER): Payer: BLUE CROSS/BLUE SHIELD | Admitting: Internal Medicine

## 2016-10-31 VITALS — BP 110/68 | HR 72 | Temp 98.4°F | Ht 76.0 in | Wt 248.2 lb

## 2016-10-31 DIAGNOSIS — J3089 Other allergic rhinitis: Secondary | ICD-10-CM | POA: Diagnosis not present

## 2016-10-31 DIAGNOSIS — Z0001 Encounter for general adult medical examination with abnormal findings: Secondary | ICD-10-CM | POA: Diagnosis not present

## 2016-10-31 DIAGNOSIS — Z860100 Personal history of colon polyps, unspecified: Secondary | ICD-10-CM

## 2016-10-31 DIAGNOSIS — R972 Elevated prostate specific antigen [PSA]: Secondary | ICD-10-CM | POA: Diagnosis not present

## 2016-10-31 DIAGNOSIS — I10 Essential (primary) hypertension: Secondary | ICD-10-CM

## 2016-10-31 DIAGNOSIS — Z114 Encounter for screening for human immunodeficiency virus [HIV]: Secondary | ICD-10-CM

## 2016-10-31 DIAGNOSIS — J309 Allergic rhinitis, unspecified: Secondary | ICD-10-CM

## 2016-10-31 DIAGNOSIS — Z1159 Encounter for screening for other viral diseases: Secondary | ICD-10-CM

## 2016-10-31 DIAGNOSIS — Z23 Encounter for immunization: Secondary | ICD-10-CM | POA: Diagnosis not present

## 2016-10-31 DIAGNOSIS — E785 Hyperlipidemia, unspecified: Secondary | ICD-10-CM | POA: Diagnosis not present

## 2016-10-31 DIAGNOSIS — E291 Testicular hypofunction: Secondary | ICD-10-CM | POA: Diagnosis not present

## 2016-10-31 MED ORDER — METHYLPREDNISOLONE ACETATE 80 MG/ML IJ SUSP
80.0000 mg | Freq: Once | INTRAMUSCULAR | Status: AC
  Administered 2016-10-31: 80 mg via INTRAMUSCULAR

## 2016-10-31 MED ORDER — TRIAMCINOLONE ACETONIDE 55 MCG/ACT NA AERO: 2.0000 | INHALATION_SPRAY | Freq: Every day | NASAL | 12 refills | Status: DC

## 2016-10-31 MED ORDER — DOXYCYCLINE HYCLATE 100 MG PO TABS: 100.0000 mg | ORAL_TABLET | Freq: Two times a day (BID) | ORAL | 0 refills | Status: DC

## 2016-10-31 NOTE — Progress Notes (Signed)
Subjective:    Patient ID: Brad Ray, male    DOB: 1960-07-16, 56 y.o.   MRN: [id number]  HPI  Here for wellness and f/u;  Overall doing ok;  Pt denies Chest pain, worsening SOB, DOE, wheezing, orthopnea, PND, worsening LE edema, palpitations, dizziness or syncope.  Pt denies neurological change such as new headache, facial or extremity weakness.  Pt denies polydipsia, polyuria, or low sugar symptoms. Pt states overall good compliance with treatment and medications, good tolerability, and has been trying to follow appropriate diet.  Pt denies worsening depressive symptoms, suicidal ideation or panic. No fever, night sweats, wt loss, loss of appetite, or other constitutional symptoms.  Pt states good ability with ADL's, has low fall risk, home safety reviewed and adequate, no other significant changes in hearing or vision, and only occasionally active with exercise. Denies urinary symptoms such as dysuria, frequency, urgency, flank pain, hematuria or n/v, fever, chills. Does have several wks ongoing nasal allergy symptoms with clearish congestion, itch and sneezing, without fever, pain, ST, cough, swelling or wheezing. Declines statin for HLD, prefers diet for now.  Pt continues to have chronic recurring neck and LBP without change and no bowel or bladder change, fever, wt loss,  worsening LE pain/numbness/weakness, gait change or falls. He is considering stopping tx or low testosterone as just doesnot feel improved stamina with tx Wt Readings from Last 3 Encounters:  10/31/16 248 lb 3.2 oz (112.6 kg)  02/03/16 247 lb (112 kg)  08/08/15 249 lb (112.9 kg)   BP Readings from Last 3 Encounters:  10/31/16 110/68  02/03/16 130/88  08/08/15 136/72   Past Medical History:  Diagnosis Date  . Abdominal pain, right upper quadrant 12/09/2007  . ALLERGIC RHINITIS 03/19/2007  . DEGENERATIVE JOINT DISEASE, LUMBAR SPINE 12/23/2006  . [place] DISEASE, CERVICAL 12/23/2006  . FATIGUE 04/28/2009  . GERD  12/23/2006  . HYPERCHOLESTEROLEMIA 12/23/2006  . HYPERLIPIDEMIA 03/19/2007  . HYPERSOMNIA 04/28/2009  . HYPERTENSION 03/19/2007  . HYPOTENSION 04/13/2009  . INSOMNIA, HX OF 12/23/2006  . LIVER MASS 12/11/2007  . MENIERE'S DISEASE 12/23/2006  . OTITIS MEDIA, LEFT 04/13/2009  . PERFORATION, TYMPANIC MEMBRANE NOS 12/23/2006   Past Surgical History:  Procedure Laterality Date  . left knee (MCL) repair    . s/p left ear surgury for vertigo    . s/p left knee medial collateral ligament damage      reports that he has never smoked. He has never used smokeless tobacco. He reports that he drinks alcohol. He reports that he does not use drugs. family history includes Alcohol abuse in his other; Diabetes in his father; Heart disease in his father; Hypertension in his father. Allergies  Allergen Reactions  . Penicillins     Childhood reaction   Current Outpatient Prescriptions on File Prior to Visit  Medication Sig Dispense Refill  . CIALIS 20 MG tablet TAKE ONE TABLET BY MOUTH ONCE DAILY AS NEEDED FOR ERECTICLE DYSFUNCTION 10 tablet 11  . levothyroxine (SYNTHROID, LEVOTHROID) 25 MCG tablet Take 1 tablet (25 mcg total) by mouth daily before breakfast. 90 tablet 3  . Multiple Vitamins-Minerals (CENTRUM SILVER PO) Take 1 each by mouth daily.      Marland Kitchen telmisartan (MICARDIS) 80 MG tablet Take 1 tablet (80 mg total) by mouth daily. **PT NEEDS PHYSICAL FOR ADDITIONAL REFILLS** 90 tablet 0  . vitamin C (ASCORBIC ACID) 500 MG tablet Take 1,000 mg by mouth daily.      Marland Kitchen zolpidem (AMBIEN) 10 MG tablet TAKE  1 TABLET AT BEDTIME FOR SLEEP 90 tablet 0  . testosterone (ANDROGEL) 50 MG/5GM (1%) GEL APPLY 5 GRAMS (1 PACKET) ONTO THE SKIN DAILY (Patient not taking: Reported on 10/31/2016) 450 Package 1  . vitamin B-12 (CYANOCOBALAMIN) 500 MCG tablet Take 500 mcg by mouth daily.       No current facility-administered medications on file prior to visit.    Review of Systems Constitutional: Negative for other unusual  diaphoresis, sweats, appetite or weight changes HENT: Negative for other worsening hearing loss, ear pain, facial swelling, mouth sores or neck stiffness.   Eyes: Negative for other worsening pain, redness or other visual disturbance.  Respiratory: Negative for other stridor or swelling Cardiovascular: Negative for other palpitations or other chest pain  Gastrointestinal: Negative for worsening diarrhea or loose stools, blood in stool, distention or other pain Genitourinary: Negative for hematuria, flank pain or other change in urine volume.  Musculoskeletal: Negative for myalgias or other joint swelling.  Skin: Negative for other color change, or other wound or worsening drainage.  Neurological: Negative for other syncope or numbness. Hematological: Negative for other adenopathy or swelling Psychiatric/Behavioral: Negative for hallucinations, other worsening agitation, SI, self-injury, or new decreased concentration All other system neg per pt    Objective:   Physical Exam BP 110/68   Pulse 72   Temp 98.4 F (36.9 C) (Oral)   Ht 6\' 4"  (1.93 m)   Wt 248 lb 3.2 oz (112.6 kg)   SpO2 96%   BMI 30.21 kg/m  VS noted,  Constitutional: Pt is oriented to person, place, and time. Appears well-developed and well-nourished, in no significant distress and comfortable Head: Normocephalic and atraumatic  Eyes: Conjunctivae and EOM are normal. Pupils are equal, round, and reactive to light Right Ear: External ear normal without discharge Left Ear: External ear normal without discharge Bilat tm's with mild erythema.  Max sinus areas non tender.  Pharynx with mild erythema, no exudate Nose: Nose without discharge or deformity Mouth/Throat: Oropharynx is without other ulcerations and moist  Neck: Normal range of motion. Neck supple. No JVD present. No tracheal deviation present or significant neck LA or mass Cardiovascular: Normal rate, regular rhythm, normal heart sounds and intact distal pulses.     Pulmonary/Chest: WOB normal and breath sounds without rales or wheezing  Abdominal: Soft. Bowel sounds are normal. NT. No HSM  Musculoskeletal: Normal range of motion. Exhibits no edema Lymphadenopathy: Has no other cervical adenopathy.  Neurological: Pt is alert and oriented to person, place, and time. Pt has normal reflexes. No cranial nerve deficit. Motor grossly intact, Gait intact Skin: Skin is warm and dry. No rash noted or new ulcerations Psychiatric:  Has normal mood and affect. Behavior is normal without agitation No other exam findings Lab Results  Component Value Date   WBC 5.7 10/25/2016   HGB 16.2 10/25/2016   HCT 48.4 10/25/2016   PLT 302.0 10/25/2016   GLUCOSE 93 10/25/2016   CHOL 203 (H) 10/25/2016   TRIG 139.0 10/25/2016   HDL 44.90 10/25/2016   LDLDIRECT 173.7 03/17/2013   LDLCALC 130 (H) 10/25/2016   ALT 19 10/25/2016   AST 17 10/25/2016   NA 140 10/25/2016   K 5.2 (H) 10/25/2016   CL 104 10/25/2016   CREATININE 1.04 10/25/2016   BUN 12 10/25/2016   CO2 28 10/25/2016   TSH 3.82 10/25/2016   PSA 2.42 10/25/2016   HGBA1C 5.3 08/03/2015   MICROALBUR 1.3 08/03/2015       Assessment & Plan:

## 2016-10-31 NOTE — Patient Instructions (Addendum)
You had the flu shot today, and the steroid shot for allergies  Please take all new medication as prescribed - the nasacort for allergies  Please take all new medication as prescribed - the antibiotic for 1 month  Please return for LAB only in 4 wks (or soon after) for follow up lab testing  Please continue all other medications as before, and refills have been done if requested.  Please have the pharmacy call with any other refills you may need.  Please continue your efforts at being more active, low cholesterol diet, and weight control.  You are otherwise up to date with prevention measures today.  Please keep your appointments with your specialists as you may have planned  You will be contacted regarding the referral for: colonoscopy  Please return in 1 year for your yearly visit, or sooner if needed, with Lab testing done 3-5 days before

## 2016-11-03 NOTE — Assessment & Plan Note (Signed)
For lower chol diet, consider statin, for f/u lab, goal LDL

## 2016-11-03 NOTE — Assessment & Plan Note (Signed)
stable overall by history and exam, recent data reviewed with pt, and pt to continue medical treatment as before,  to f/u any worsening symptoms or concerns BP Readings from Last 3 Encounters:  10/31/16 110/68  02/03/16 130/88  08/08/15 136/72

## 2016-11-03 NOTE — Assessment & Plan Note (Signed)

## 2016-11-03 NOTE — Assessment & Plan Note (Signed)
With minimal clinical improvement, for testosterone tx

## 2016-11-03 NOTE — Assessment & Plan Note (Signed)
Mild to mod, for nasacort asd,  to f/u any worsening symptoms or concerns 

## 2016-11-03 NOTE — Assessment & Plan Note (Addendum)
Asympt, for doxy course x 4 wks, then f/u PSA lab only, note pt is on testosterone replacement, consider urology if not improved  In addition to the time spent performing CPE, I spent an additional 25 minutes face to face,in which greater than 50% of this time was spent in counseling and coordination of care for patient's acute illness as documented., including the differential dx, tx, further evaluation and other management of psa increasing, HTN, HLD, hypogonadism, and allergic rhinitis

## 2016-11-06 ENCOUNTER — Other Ambulatory Visit: Payer: Self-pay | Admitting: Internal Medicine

## 2016-11-07 NOTE — Telephone Encounter (Signed)
Done hardcopy to [facility]  

## 2016-11-07 NOTE — Telephone Encounter (Signed)
faxed

## 2016-11-12 ENCOUNTER — Encounter: Payer: Self-pay | Admitting: Gastroenterology

## 2016-12-11 ENCOUNTER — Other Ambulatory Visit: Payer: BLUE CROSS/BLUE SHIELD

## 2016-12-11 ENCOUNTER — Telehealth: Payer: Self-pay

## 2016-12-11 ENCOUNTER — Other Ambulatory Visit: Payer: Self-pay | Admitting: Internal Medicine

## 2016-12-11 ENCOUNTER — Other Ambulatory Visit (INDEPENDENT_AMBULATORY_CARE_PROVIDER_SITE_OTHER): Payer: BLUE CROSS/BLUE SHIELD

## 2016-12-11 DIAGNOSIS — Z0001 Encounter for general adult medical examination with abnormal findings: Secondary | ICD-10-CM | POA: Diagnosis not present

## 2016-12-11 LAB — BASIC METABOLIC PANEL
BUN: 15 mg/dL (ref 6–23)
CHLORIDE: 104 meq/L (ref 96–112)
CO2: 24 meq/L (ref 19–32)
CREATININE: 1.14 mg/dL (ref 0.40–1.50)
Calcium: 9.4 mg/dL (ref 8.4–10.5)
GFR: 71.08 mL/min (ref >60.00)
GLUCOSE: 94 mg/dL (ref 70–99)
Potassium: 4.8 mEq/L (ref 3.5–5.1)
Sodium: 139 mEq/L (ref 135–145)

## 2016-12-11 LAB — CBC WITH DIFFERENTIAL/PLATELET
BASOS ABS: 0 10*3/uL (ref 0.0–0.1)
Basophils Relative: 0.6 % (ref 0.0–3.0)
EOS ABS: 0.1 10*3/uL (ref 0.0–0.7)
Eosinophils Relative: 2.2 % (ref 0.0–5.0)
HEMATOCRIT: 48.7 % (ref 39.0–52.0)
Hemoglobin: 16.5 g/dL (ref 13.0–17.0)
LYMPHS PCT: 38.5 % (ref 12.0–46.0)
Lymphs Abs: 2.1 10*3/uL (ref 0.7–4.0)
MCHC: 34 g/dL (ref 30.0–36.0)
MCV: 91.1 fl (ref 78.0–100.0)
Monocytes Absolute: 0.5 10*3/uL (ref 0.1–1.0)
Monocytes Relative: 10 % (ref 3.0–12.0)
NEUTROS ABS: 2.6 10*3/uL (ref 1.4–7.7)
NEUTROS PCT: 48.7 % (ref 43.0–77.0)
PLATELETS: 267 10*3/uL (ref 150.0–400.0)
RBC: 5.34 Mil/uL (ref 4.22–5.81)
RDW: 13.9 % (ref 11.5–15.5)
WBC: 5.4 10*3/uL (ref 4.0–10.5)

## 2016-12-11 LAB — URINALYSIS, ROUTINE W REFLEX MICROSCOPIC
Bilirubin Urine: NEGATIVE
Hgb urine dipstick: NEGATIVE
Ketones, ur: NEGATIVE
Leukocytes, UA: NEGATIVE
Nitrite: NEGATIVE
PH: 5.5 (ref 5.0–8.0)
RBC / HPF: NONE SEEN (ref 0–?)
TOTAL PROTEIN, URINE-UPE24: NEGATIVE
URINE GLUCOSE: NEGATIVE
UROBILINOGEN UA: 0.2 (ref 0.0–1.0)
WBC UA: NONE SEEN (ref 0–?)

## 2016-12-11 LAB — LIPID PANEL
CHOLESTEROL: 240 mg/dL — AB (ref 0–200)
HDL: 50.4 mg/dL (ref >39.00)
LDL Cholesterol: 155 mg/dL — ABNORMAL HIGH (ref 0–99)
NONHDL: 189.62
TRIGLYCERIDES: 171 mg/dL — AB (ref 0.0–149.0)
Total CHOL/HDL Ratio: 5
VLDL: 34.2 mg/dL (ref 0.0–40.0)

## 2016-12-11 LAB — HEPATIC FUNCTION PANEL
ALBUMIN: 4.3 g/dL (ref 3.5–5.2)
ALT: 18 U/L (ref 0–53)
AST: 13 U/L (ref 0–37)
Alkaline Phosphatase: 60 U/L (ref 39–117)
BILIRUBIN DIRECT: 0.1 mg/dL (ref 0.0–0.3)
TOTAL PROTEIN: 7.7 g/dL (ref 6.0–8.3)
Total Bilirubin: 0.7 mg/dL (ref 0.2–1.2)

## 2016-12-11 LAB — PSA: PSA: 1.98 ng/mL (ref 0.10–4.00)

## 2016-12-11 LAB — TSH: TSH: 3.36 u[IU]/mL (ref 0.35–4.50)

## 2016-12-11 MED ORDER — ROSUVASTATIN CALCIUM 20 MG PO TABS
20.0000 mg | ORAL_TABLET | Freq: Every day | ORAL | 3 refills | Status: DC
Start: 2016-12-11 — End: 2017-01-02

## 2016-12-11 NOTE — Telephone Encounter (Signed)
-----   Message from Biagio Borg, MD sent at 12/11/2016  1:13 PM EDT ----- Left message on MyChart, pt to cont same tx except  The test results show that your current treatment is OK, except the LDL cholesterol is moderately high.  Please follow a lower cholesterol diet, but also since the LDL has been up for several years, we should start Crestor 20 mg (generic) daily to help reduce the future risk of heart disease and stroke.  A new prescription will be sent, and you should hear from the office as well.Redmond Baseman to please inform pt, I will do rx

## 2016-12-11 NOTE — Telephone Encounter (Signed)
Called pt, LVM.   

## 2016-12-11 NOTE — Addendum Note (Signed)
Addended by: Kathlyn [place] on: 12/11/2016 02:47 PM   Modules accepted: Orders

## 2016-12-11 NOTE — Telephone Encounter (Signed)
Spoke to patient, he informed me the wrong labs were drawn. Upon further looking CPE for 2019 labs were drawn and not his 1 month follow up labs. Renee in the lab downstairs was notified of the situation and will be contacting the patient to explain.

## 2016-12-11 NOTE — Telephone Encounter (Signed)
Patient called back. He was not happy about starting Cholesterol medication. He states he does not want to start this. He would like a call back.

## 2017-01-02 ENCOUNTER — Other Ambulatory Visit: Payer: Self-pay | Admitting: Internal Medicine

## 2017-01-02 ENCOUNTER — Other Ambulatory Visit (INDEPENDENT_AMBULATORY_CARE_PROVIDER_SITE_OTHER): Payer: BLUE CROSS/BLUE SHIELD

## 2017-01-02 ENCOUNTER — Ambulatory Visit (AMBULATORY_SURGERY_CENTER): Payer: Self-pay

## 2017-01-02 VITALS — Ht 75.0 in | Wt 248.6 lb

## 2017-01-02 DIAGNOSIS — Z1159 Encounter for screening for other viral diseases: Secondary | ICD-10-CM

## 2017-01-02 DIAGNOSIS — E291 Testicular hypofunction: Secondary | ICD-10-CM | POA: Diagnosis not present

## 2017-01-02 DIAGNOSIS — Z86010 Personal history of colonic polyps: Secondary | ICD-10-CM

## 2017-01-02 DIAGNOSIS — R972 Elevated prostate specific antigen [PSA]: Secondary | ICD-10-CM

## 2017-01-02 DIAGNOSIS — Z114 Encounter for screening for human immunodeficiency virus [HIV]: Secondary | ICD-10-CM | POA: Diagnosis not present

## 2017-01-02 LAB — PSA: PSA: 1.48 ng/mL (ref 0.10–4.00)

## 2017-01-02 MED ORDER — NA SULFATE-K SULFATE-MG SULF 17.5-3.13-1.6 GM/177ML PO SOLN: 1.0000 | Freq: Once | ORAL | 0 refills | Status: AC

## 2017-01-02 NOTE — Progress Notes (Signed)
Denies allergies to eggs or soy products. Denies complication of anesthesia or sedation. Denies use of weight loss medication. Denies use of O2.   Emmi instructions declined.  

## 2017-01-03 LAB — HIV ANTIBODY (ROUTINE TESTING W REFLEX): HIV 1&2 Ab, 4th Generation: NONREACTIVE

## 2017-01-03 LAB — HEPATITIS C ANTIBODY
Hepatitis C Ab: NONREACTIVE
SIGNAL TO CUT-OFF: 0.03 (ref <1.00)

## 2017-01-04 LAB — TESTOSTERONE, FREE, TOTAL, SHBG
Sex Hormone Binding: 66.6 nmol/L (ref 19.3–76.4)
TESTOSTERONE: 341 ng/dL (ref 264–916)
Testosterone, Free: 7.5 pg/mL (ref 7.2–24.0)

## 2017-01-07 ENCOUNTER — Encounter: Payer: Self-pay | Admitting: Gastroenterology

## 2017-01-15 ENCOUNTER — Ambulatory Visit (AMBULATORY_SURGERY_CENTER): Payer: BLUE CROSS/BLUE SHIELD | Admitting: Gastroenterology

## 2017-01-15 ENCOUNTER — Encounter: Payer: Self-pay | Admitting: Gastroenterology

## 2017-01-15 VITALS — BP 114/79 | HR 70 | Temp 97.8°F | Resp 14 | Ht 75.0 in | Wt 248.0 lb

## 2017-01-15 DIAGNOSIS — D122 Benign neoplasm of ascending colon: Secondary | ICD-10-CM

## 2017-01-15 DIAGNOSIS — Z86010 Personal history of colonic polyps: Secondary | ICD-10-CM

## 2017-01-15 DIAGNOSIS — D120 Benign neoplasm of cecum: Secondary | ICD-10-CM | POA: Diagnosis not present

## 2017-01-15 MED ORDER — SODIUM CHLORIDE 0.9 % IV SOLN: 500.0000 mL | INTRAVENOUS | Status: DC

## 2017-01-15 NOTE — Patient Instructions (Signed)
YOU HAD AN ENDOSCOPIC PROCEDURE TODAY AT [facility]:   Refer to the procedure report that was given to you for any specific questions about what was found during the examination.  If the procedure report does not answer your questions, please call your gastroenterologist to clarify.  If you requested that your care partner not be given the details of your procedure findings, then the procedure report has been included in a sealed envelope for you to review at your convenience later.  YOU SHOULD EXPECT: Some feelings of bloating in the abdomen. Passage of more gas than usual.  Walking can help get rid of the air that was put into your GI tract during the procedure and reduce the bloating. If you had a lower endoscopy (such as a colonoscopy or flexible sigmoidoscopy) you may notice spotting of blood in your stool or on the toilet paper. If you underwent a bowel prep for your procedure, you may not have a normal bowel movement for a few days.  Please Note:  You might notice some irritation and congestion in your nose or some drainage.  This is from the oxygen used during your procedure.  There is no need for concern and it should clear up in a day or so.  SYMPTOMS TO REPORT IMMEDIATELY:   Following lower endoscopy (colonoscopy or flexible sigmoidoscopy):  Excessive amounts of blood in the stool  Significant tenderness or worsening of abdominal pains  Swelling of the abdomen that is new, acute  Fever of 100F or higher   For urgent or emergent issues, a gastroenterologist can be reached at any hour by calling [phone].   DIET:  We do recommend a small meal at first, but then you may proceed to your regular diet.  Drink plenty of fluids but you should avoid alcoholic beverages for 24 hours.  ACTIVITY:  You should plan to take it easy for the rest of today and you should NOT DRIVE or use heavy machinery until tomorrow (because of the sedation medicines used during the test).     FOLLOW UP: Our staff will call the number listed on your records the next business day following your procedure to check on you and address any questions or concerns that you may have regarding the information given to you following your procedure. If we do not reach you, we will leave a message.  However, if you are feeling well and you are not experiencing any problems, there is no need to return our call.  We will assume that you have returned to your regular daily activities without incident.  If any biopsies were taken you will be contacted by phone or by letter within the next 1-3 weeks.  Please call us at [phone] if you have not heard about the biopsies in 3 weeks.    SIGNATURES/CONFIDENTIALITY: You and/or your care partner have signed paperwork which will be entered into your electronic medical record.  These signatures attest to the fact that that the information above on your After Visit Summary has been reviewed and is understood.  Full responsibility of the confidentiality of this discharge information lies with you and/or your care-partner.    Handout was given to your care partner on polyps.  NO ASPIRIN, ASPIRIN CONTAINING PRODUCTS (BC OR GOODY POWDERS) OR NSAIDS (IBUPROFEN, ADVIL, ALEVE, AND MOTRIN) FOR 5 days; TYLENOL IS OK TO TAKE. You may resume your other current medications today. Await biopsy results. Please call if any questions or concerns.

## 2017-01-15 NOTE — Progress Notes (Signed)
Report given to PACU, vss 

## 2017-01-15 NOTE — Progress Notes (Signed)
Called to room to assist during endoscopic procedure.  Patient ID and intended procedure confirmed with present staff. Received instructions for my participation in the procedure from the performing physician.  

## 2017-01-15 NOTE — Op Note (Signed)
[name]: Brad Ray Procedure Date: 01/15/2017 9:46 AM MRN: [id number] Endoscopist: [name] L. Loletha Ray , MD Age: 56 Referring MD:  Date of Birth: 06-Jul-1960 Gender: Male Account #: [id number] Procedure:                Colonoscopy Indications:              Surveillance: Personal history of adenomatous                            polyps on last colonoscopy 3 years ago (28mm SSP and                            12mm TA in 05/2013) Medicines:                Monitored Anesthesia Care Procedure:                Pre-Anesthesia Assessment:                           - Prior to the procedure, a History and Physical                            was performed, and patient medications and                            allergies were reviewed. The patient's tolerance of                            previous anesthesia was also reviewed. The risks                            and benefits of the procedure and the sedation                            options and risks were discussed with the patient.                            All questions were answered, and informed consent                            was obtained. Prior Anticoagulants: The patient has                            taken no previous anticoagulant or antiplatelet                            agents. ASA Grade Assessment: II - A patient with                            mild systemic disease. After reviewing the risks                            and benefits, the patient was deemed in  satisfactory condition to undergo the procedure.                           After obtaining informed consent, the colonoscope                            was passed under direct vision. Throughout the                            procedure, the patient's blood pressure, pulse, and                            oxygen saturations were monitored continuously. The                            Colonoscope was introduced through the anus  and                            advanced to the the cecum, identified by                            appendiceal orifice and ileocecal valve. The                            colonoscopy was performed without difficulty. The                            patient tolerated the procedure well. The quality                            of the bowel preparation was good. The ileocecal                            valve, appendiceal orifice, and rectum were                            photographed. The quality of the bowel preparation                            was evaluated using the BBPS [facility] Bowel                            Preparation Scale) with scores of: Right Colon = 2,                            Transverse Colon = 2 and Left Colon = 2. The total                            BBPS score equals 6. The bowel preparation used was                            SUPREP. Scope In: 9:48:44 AM Scope Out: 10:04:50 AM Scope Withdrawal Time: 0 hours 14 minutes 2 seconds  Total Procedure Duration: 0 hours 16 minutes 6 seconds  Findings:                 The perianal and digital rectal examinations were                            normal.                           Two sessile polyps were found in the proximal                            ascending colon and cecum. The polyps were 8 to 10                            mm in size (both probable SSP by Wl and NBI). These                            polyps were removed with a cold snare. Resection                            and retrieval were complete.                           The exam was otherwise without abnormality on                            direct and retroflexion views. Complications:            No immediate complications. Estimated Blood Loss:     Estimated blood loss was minimal. Impression:               - Two 8 to 10 mm polyps in the proximal ascending                            colon and in the cecum, removed with a cold snare.                             Resected and retrieved.                           - The examination was otherwise normal on direct                            and retroflexion views. Recommendation:           - Patient has a contact number available for                            emergencies. The signs and symptoms of potential                            delayed complications were discussed with the                            patient.  Return to normal activities tomorrow.                            Written discharge instructions were provided to the                            patient.                           - Resume previous diet.                           - Continue present medications.                           - No aspirin, ibuprofen, naproxen, or other                            non-steroidal anti-inflammatory drugs for 5 days                            after polyp removal.                           - Await pathology results.                           - Repeat colonoscopy is recommended for                            surveillance. The colonoscopy date will be                            determined after pathology results from today's                            exam become available for review. Brad L. Loletha Carrow, MD 01/15/2017 10:11:38 AM This report has been signed electronically.

## 2017-01-15 NOTE — Progress Notes (Signed)
No problems noted in the recovery room. maw 

## 2017-01-15 NOTE — Progress Notes (Signed)
Pt's states no medical or surgical changes since previsit or office visit. Patient has not traveled outside of the country in the last 3 weeks. SM

## 2017-01-16 ENCOUNTER — Encounter: Payer: BLUE CROSS/BLUE SHIELD | Admitting: Gastroenterology

## 2017-01-16 ENCOUNTER — Telehealth: Payer: Self-pay

## 2017-01-16 NOTE — Telephone Encounter (Signed)
Attempted to reach patient for post-procedure f/u call. No answer. Left message that we will attempt to reach him again later today and for him to please not hesitate to call us if he has any questions/concerns regarding his care. 

## 2017-01-16 NOTE — Telephone Encounter (Signed)
2nd attempt to reach patient for post-procedure f/u call. No answer. Left message that we hope he is doing well and for him to please call us if he has any questions/concerns regarding his care.

## 2017-01-21 ENCOUNTER — Encounter: Payer: Self-pay | Admitting: Gastroenterology

## 2017-02-20 ENCOUNTER — Other Ambulatory Visit: Payer: Self-pay | Admitting: Internal Medicine

## 2017-05-25 ENCOUNTER — Other Ambulatory Visit: Payer: Self-pay | Admitting: Internal Medicine

## 2017-05-29 ENCOUNTER — Telehealth: Payer: Self-pay | Admitting: Internal Medicine

## 2017-05-29 NOTE — Telephone Encounter (Signed)
Copied from [place] [phone]. Topic: Quick Communication - Rx Refill/Question >> May 29, 2017  6:11 PM Oliver Pila B wrote: Medication: zolpidem (AMBIEN) 10 MG tablet [[id number]]   Pt called and questioned why another physician filled the medication and prescribed it w/ the duration of 30 days instead of 90, contact pt to clarify

## 2017-05-30 NOTE — Telephone Encounter (Signed)
Explained to pt that primary PCP was out of the office due to family reasons and another provider was taking care of some of his medication approvals. He expressed understanding.

## 2017-06-23 ENCOUNTER — Other Ambulatory Visit: Payer: Self-pay | Admitting: Internal Medicine

## 2017-06-24 NOTE — Telephone Encounter (Signed)
Done erx 

## 2017-06-24 NOTE — Telephone Encounter (Signed)
Control database checked last refill: 05/26/2017 LOV: 10/31/2016

## 2017-07-15 ENCOUNTER — Other Ambulatory Visit (INDEPENDENT_AMBULATORY_CARE_PROVIDER_SITE_OTHER): Payer: BLUE CROSS/BLUE SHIELD

## 2017-07-15 ENCOUNTER — Telehealth: Payer: Self-pay

## 2017-07-15 DIAGNOSIS — Z0000 Encounter for general adult medical examination without abnormal findings: Secondary | ICD-10-CM

## 2017-07-15 LAB — CBC WITH DIFFERENTIAL/PLATELET
Basophils Absolute: 0 10*3/uL (ref 0.0–0.1)
Basophils Relative: 0.3 % (ref 0.0–3.0)
EOS ABS: 0.1 10*3/uL (ref 0.0–0.7)
Eosinophils Relative: 2 % (ref 0.0–5.0)
HCT: 47.7 % (ref 39.0–52.0)
Hemoglobin: 16.1 g/dL (ref 13.0–17.0)
Lymphocytes Relative: 29.8 % (ref 12.0–46.0)
Lymphs Abs: 2.2 10*3/uL (ref 0.7–4.0)
MCHC: 33.8 g/dL (ref 30.0–36.0)
MCV: 91.1 fl (ref 78.0–100.0)
MONO ABS: 0.7 10*3/uL (ref 0.1–1.0)
Monocytes Relative: 9.9 % (ref 3.0–12.0)
Neutro Abs: 4.3 10*3/uL (ref 1.4–7.7)
Neutrophils Relative %: 58 % (ref 43.0–77.0)
Platelets: 268 10*3/uL (ref 150.0–400.0)
RBC: 5.23 Mil/uL (ref 4.22–5.81)
RDW: 13.7 % (ref 11.5–15.5)
WBC: 7.4 10*3/uL (ref 4.0–10.5)

## 2017-07-15 LAB — HEPATIC FUNCTION PANEL
ALK PHOS: 57 U/L (ref 39–117)
ALT: 21 U/L (ref 0–53)
AST: 17 U/L (ref 0–37)
Albumin: 4.1 g/dL (ref 3.5–5.2)
BILIRUBIN TOTAL: 0.5 mg/dL (ref 0.2–1.2)
Bilirubin, Direct: 0.1 mg/dL (ref 0.0–0.3)
Total Protein: 7.4 g/dL (ref 6.0–8.3)

## 2017-07-15 LAB — URINALYSIS, ROUTINE W REFLEX MICROSCOPIC
BILIRUBIN URINE: NEGATIVE
HGB URINE DIPSTICK: NEGATIVE
LEUKOCYTES UA: NEGATIVE
NITRITE: NEGATIVE
PH: 5.5 (ref 5.0–8.0)
RBC / HPF: NONE SEEN (ref 0–?)
Specific Gravity, Urine: >=1.03 — AB (ref 1.000–1.030)
UROBILINOGEN UA: 0.2 (ref 0.0–1.0)
Urine Glucose: NEGATIVE

## 2017-07-15 LAB — TSH: TSH: 4.61 u[IU]/mL — AB (ref 0.35–4.50)

## 2017-07-15 LAB — LIPID PANEL
Cholesterol: 234 mg/dL — ABNORMAL HIGH (ref 0–200)
HDL: 44.6 mg/dL (ref >39.00)
NONHDL: 189.1
Total CHOL/HDL Ratio: 5
Triglycerides: 388 mg/dL — ABNORMAL HIGH (ref 0.0–149.0)
VLDL: 77.6 mg/dL — ABNORMAL HIGH (ref 0.0–40.0)

## 2017-07-15 LAB — BASIC METABOLIC PANEL
BUN: 13 mg/dL (ref 6–23)
CHLORIDE: 104 meq/L (ref 96–112)
CO2: 26 meq/L (ref 19–32)
Calcium: 9.3 mg/dL (ref 8.4–10.5)
Creatinine, Ser: 1.07 mg/dL (ref 0.40–1.50)
GFR: 76.3 mL/min (ref >60.00)
GLUCOSE: 88 mg/dL (ref 70–99)
POTASSIUM: 4.6 meq/L (ref 3.5–5.1)
SODIUM: 137 meq/L (ref 135–145)

## 2017-07-15 LAB — PSA: PSA: 1.38 ng/mL (ref 0.10–4.00)

## 2017-07-15 LAB — LDL CHOLESTEROL, DIRECT: Direct LDL: 133 mg/dL

## 2017-07-15 NOTE — Telephone Encounter (Signed)
Labs orders needed for upcoming CPE

## 2017-07-17 ENCOUNTER — Telehealth: Payer: Self-pay | Admitting: Internal Medicine

## 2017-07-17 ENCOUNTER — Ambulatory Visit (INDEPENDENT_AMBULATORY_CARE_PROVIDER_SITE_OTHER): Payer: BLUE CROSS/BLUE SHIELD | Admitting: Internal Medicine

## 2017-07-17 ENCOUNTER — Encounter: Payer: Self-pay | Admitting: Internal Medicine

## 2017-07-17 VITALS — BP 114/68 | HR 75 | Temp 97.7°F | Ht 75.0 in | Wt 248.0 lb

## 2017-07-17 DIAGNOSIS — E785 Hyperlipidemia, unspecified: Secondary | ICD-10-CM | POA: Diagnosis not present

## 2017-07-17 DIAGNOSIS — E039 Hypothyroidism, unspecified: Secondary | ICD-10-CM

## 2017-07-17 DIAGNOSIS — Z0000 Encounter for general adult medical examination without abnormal findings: Secondary | ICD-10-CM

## 2017-07-17 DIAGNOSIS — Z23 Encounter for immunization: Secondary | ICD-10-CM

## 2017-07-17 MED ORDER — LEVOTHYROXINE SODIUM 25 MCG PO TABS: ORAL_TABLET | ORAL | 3 refills | Status: DC

## 2017-07-17 MED ORDER — DIAZEPAM 5 MG PO TABS: 5.0000 mg | ORAL_TABLET | Freq: Two times a day (BID) | ORAL | 0 refills | Status: DC | PRN

## 2017-07-17 MED ORDER — ZOSTER VAC RECOMB ADJUVANTED 50 MCG/0.5ML IM SUSR: 0.5000 mL | Freq: Once | INTRAMUSCULAR | 1 refills | Status: AC

## 2017-07-17 MED ORDER — ZOLPIDEM TARTRATE 10 MG PO TABS: ORAL_TABLET | ORAL | 3 refills | Status: DC

## 2017-07-17 MED ORDER — TADALAFIL 20 MG PO TABS: ORAL_TABLET | ORAL | 11 refills | Status: DC

## 2017-07-17 MED ORDER — TELMISARTAN 80 MG PO TABS: 80.0000 mg | ORAL_TABLET | Freq: Every day | ORAL | 3 refills | Status: DC

## 2017-07-17 NOTE — Assessment & Plan Note (Signed)

## 2017-07-17 NOTE — Telephone Encounter (Signed)
Copied from [place] [id number]. Topic: Quick Communication - Rx Refill/Question >> Jul 17, 2017  3:34 PM Carolyn Stare wrote: The below med was sent to [facility] and pt said med need  sent to the below pharmacy   Medication  tadalafil (CIALIS) 20 MG tablet     Preferred Pharmacy  [facility]: Please be advised that RX refills may take up to 3 business days. We ask that you follow-up with your pharmacy.

## 2017-07-17 NOTE — Assessment & Plan Note (Signed)
Declines statin for now, for CT cardiac scoring

## 2017-07-17 NOTE — Patient Instructions (Addendum)
You had the Tdap tetanus shot today  You had the Shingles shot prescription sent to the pharmacy  Your EKG was normal today  Please continue all other medications as before, and refills have been done if requested.  Please have the pharmacy call with any other refills you may need.  Please continue your efforts at being more active, low cholesterol diet, and weight control.  You are otherwise up to date with prevention measures today.  Please keep your appointments with your specialists as you may have planned  You will be contacted regarding the referral for: CT cardiac scoring  Please return in 1 year for your yearly visit, or sooner if needed, with Lab testing done 3-5 days before

## 2017-07-17 NOTE — Progress Notes (Signed)
Subjective:    Patient ID: Brad Ray, male    DOB: April 05, 1960, 57 y.o.   MRN: [id number]  HPI  Here for wellness and f/u;  Overall doing ok;  Pt denies Chest pain, worsening SOB, DOE, wheezing, orthopnea, PND, worsening LE edema, palpitations, dizziness or syncope.  Pt denies neurological change such as new headache, facial or extremity weakness.  Pt denies polydipsia, polyuria, or low sugar symptoms. Pt states overall good compliance with treatment and medications, good tolerability, and has been trying to follow appropriate diet.  Pt denies worsening depressive symptoms, suicidal ideation or panic. No fever, night sweats, wt loss, loss of appetite, or other constitutional symptoms.  Pt states good ability with ADL's, has low fall risk, home safety reviewed and adequate, no other significant changes in hearing or vision, and only occasionally active with exercise.  Drinks 8 bottles of water purposefully every day  Has Nocturia x 2-3 times, and c/o fatigue and lack of overall energy. Still has some post football issue of memory and feeling down.  Admits to not taking the synthroid every day Wt Readings from Last 3 Encounters:  07/17/17 248 lb (112.5 kg)  01/15/17 248 lb (112.5 kg)  01/02/17 248 lb 9.6 oz (112.8 kg)   BP Readings from Last 3 Encounters:  07/17/17 114/68  01/15/17 114/79  10/31/16 110/68  Has been working on lower chol diet.  Declines statin for now. Has occaisional vertigo recurrent, better with valium, asks for all med refills including ambien due to persistent sleep difficulty  No other new compalints or interval hx  No longer taking the androgel as he just didn feel better with taking.  Asks for ECG and CT cardiac scoring repeat, might consdider statin if abnormal; last Ct cardiac scoring 0 in 2015 per cardiology Past Medical History:  Diagnosis Date  . Abdominal pain, right upper quadrant 12/09/2007  . ALLERGIC RHINITIS 03/19/2007  . Allergy   . DEGENERATIVE JOINT  DISEASE, LUMBAR SPINE 12/23/2006  . [place] DISEASE, CERVICAL 12/23/2006  . FATIGUE 04/28/2009  . GERD 12/23/2006  . HYPERCHOLESTEROLEMIA 12/23/2006  . HYPERLIPIDEMIA 03/19/2007  . HYPERSOMNIA 04/28/2009  . HYPERTENSION 03/19/2007  . HYPOTENSION 04/13/2009  . INSOMNIA, HX OF 12/23/2006  . LIVER MASS 12/11/2007  . MENIERE'S DISEASE 12/23/2006  . OTITIS MEDIA, LEFT 04/13/2009  . PERFORATION, TYMPANIC MEMBRANE NOS 12/23/2006   Past Surgical History:  Procedure Laterality Date  . left knee (MCL) repair    . s/p left ear surgury for vertigo    . s/p left knee medial collateral ligament damage      reports that he has never smoked. He has never used smokeless tobacco. He reports that he drinks alcohol. He reports that he does not use drugs. family history includes Alcohol abuse in his other; Diabetes in his father; Heart disease in his father; Hypertension in his father. Allergies  Allergen Reactions  . Penicillins     Childhood reaction   Current Outpatient Medications on File Prior to Visit  Medication Sig Dispense Refill  . CIALIS 20 MG tablet TAKE ONE TABLET BY MOUTH ONCE DAILY AS NEEDED FOR ERECTICLE DYSFUNCTION 10 tablet 11  . diazepam (VALIUM) 5 MG tablet Take 5 mg 3 times/day as needed-between meals & bedtime by mouth.  0  . levothyroxine (SYNTHROID, LEVOTHROID) 25 MCG tablet TAKE 1 TABLET BY MOUTH DAILY BEFORE BREAKFAST. 90 tablet 2  . Multiple Vitamins-Minerals (CENTRUM SILVER PO) Take 1 each by mouth daily.      Marland Kitchen  telmisartan (MICARDIS) 80 MG tablet Take 1 tablet (80 mg total) by mouth daily. 90 tablet 2  . triamcinolone (NASACORT) 55 MCG/ACT AERO nasal inhaler Place 2 sprays into the nose daily. 1 Inhaler 12  . vitamin B-12 (CYANOCOBALAMIN) 500 MCG tablet Take 500 mcg by mouth daily.      . vitamin C (ASCORBIC ACID) 500 MG tablet Take 1,000 mg by mouth daily.      Marland Kitchen zolpidem (AMBIEN) 10 MG tablet TAKE 1 TABLET BY MOUTH EVERYDAY AT BEDTIME 30 tablet 3   No current facility-administered  medications on file prior to visit.     Review of Systems Constitutional: Negative for other unusual diaphoresis, sweats, appetite or weight changes HENT: Negative for other worsening hearing loss, ear pain, facial swelling, mouth sores or neck stiffness.   Eyes: Negative for other worsening pain, redness or other visual disturbance.  Respiratory: Negative for other stridor or swelling Cardiovascular: Negative for other palpitations or other chest pain  Gastrointestinal: Negative for worsening diarrhea or loose stools, blood in stool, distention or other pain Genitourinary: Negative for hematuria, flank pain or other change in urine volume.  Musculoskeletal: Negative for myalgias or other joint swelling.  Skin: Negative for other color change, or other wound or worsening drainage.  Neurological: Negative for other syncope or numbness. Hematological: Negative for other adenopathy or swelling Psychiatric/Behavioral: Negative for hallucinations, other worsening agitation, SI, self-injury, or new decreased concentration All other system neg per pt    Objective:   Physical Exam BP 114/68   Pulse 75   Temp 97.7 F (36.5 C) (Oral)   Ht 6\' 3"  (1.905 m)   Wt 248 lb (112.5 kg)   SpO2 96%   BMI 31.00 kg/m  VS noted,  Constitutional: Pt is oriented to person, place, and time. Appears well-developed and well-nourished, in no significant distress and comfortable Head: Normocephalic and atraumatic  Eyes: Conjunctivae and EOM are normal. Pupils are equal, round, and reactive to light Right Ear: External ear normal without discharge Left Ear: External ear normal without discharge Nose: Nose without discharge or deformity Mouth/Throat: Oropharynx is without other ulcerations and moist  Neck: Normal range of motion. Neck supple. No JVD present. No tracheal deviation present or significant neck LA or mass Cardiovascular: Normal rate, regular rhythm, normal heart sounds and intact distal pulses.     Pulmonary/Chest: WOB normal and breath sounds without rales or wheezing  Abdominal: Soft. Bowel sounds are normal. NT. No HSM  Musculoskeletal: Normal range of motion. Exhibits no edema Lymphadenopathy: Has no other cervical adenopathy.  Neurological: Pt is alert and oriented to person, place, and time. Pt has normal reflexes. No cranial nerve deficit. Motor grossly intact, Gait intact Skin: Skin is warm and dry. No rash noted or new ulcerations Psychiatric:  Has normal mood and affect. Behavior is normal without agitation No other exam findings    Assessment & Plan:

## 2017-07-17 NOTE — Assessment & Plan Note (Signed)
Pt states will start takiing thyroid replacement daily and not miss

## 2017-07-18 NOTE — Telephone Encounter (Signed)
[place] in order to retreive prescription for Cialis that was sent to [facility]. Pharmacist that [facility] would be contacted in order to get the current prescription.

## 2017-08-05 ENCOUNTER — Ambulatory Visit (INDEPENDENT_AMBULATORY_CARE_PROVIDER_SITE_OTHER)
Admission: RE | Admit: 2017-08-05 | Discharge: 2017-08-05 | Disposition: A | Discharge status: Home / Self Care | Payer: Self-pay | Source: Ambulatory Visit | Attending: Internal Medicine | Admitting: Internal Medicine

## 2017-08-05 DIAGNOSIS — E785 Hyperlipidemia, unspecified: Secondary | ICD-10-CM

## 2017-08-05 DIAGNOSIS — Z0000 Encounter for general adult medical examination without abnormal findings: Secondary | ICD-10-CM

## 2017-08-12 DIAGNOSIS — E291 Testicular hypofunction: Secondary | ICD-10-CM | POA: Diagnosis not present

## 2017-08-12 DIAGNOSIS — R635 Abnormal weight gain: Secondary | ICD-10-CM | POA: Diagnosis not present

## 2017-08-12 DIAGNOSIS — R5383 Other fatigue: Secondary | ICD-10-CM | POA: Diagnosis not present

## 2017-08-12 DIAGNOSIS — E559 Vitamin D deficiency, unspecified: Secondary | ICD-10-CM | POA: Diagnosis not present

## 2017-08-12 DIAGNOSIS — E7800 Pure hypercholesterolemia, unspecified: Secondary | ICD-10-CM | POA: Diagnosis not present

## 2017-08-12 DIAGNOSIS — F5109 Other insomnia not due to a substance or known physiological condition: Secondary | ICD-10-CM | POA: Diagnosis not present

## 2017-08-22 DIAGNOSIS — H43813 Vitreous degeneration, bilateral: Secondary | ICD-10-CM | POA: Diagnosis not present

## 2017-09-03 ENCOUNTER — Encounter: Payer: Self-pay | Admitting: Internal Medicine

## 2017-09-03 ENCOUNTER — Ambulatory Visit (INDEPENDENT_AMBULATORY_CARE_PROVIDER_SITE_OTHER): Payer: BLUE CROSS/BLUE SHIELD | Admitting: Internal Medicine

## 2017-09-03 VITALS — BP 120/82 | HR 87 | Ht 75.0 in | Wt 252.0 lb

## 2017-09-03 DIAGNOSIS — J849 Interstitial pulmonary disease, unspecified: Secondary | ICD-10-CM | POA: Diagnosis not present

## 2017-09-03 DIAGNOSIS — R079 Chest pain, unspecified: Secondary | ICD-10-CM

## 2017-09-03 DIAGNOSIS — R918 Other nonspecific abnormal finding of lung field: Secondary | ICD-10-CM

## 2017-09-03 DIAGNOSIS — I10 Essential (primary) hypertension: Secondary | ICD-10-CM

## 2017-09-03 NOTE — Patient Instructions (Addendum)
.  Please continue all other medications as before, and refills have been done if requested.  Please have the pharmacy call with any other refills you may need.  Please continue your efforts at being more active, low cholesterol diet, and weight control.  Please keep your appointments with your specialists as you may have planned  You will be contacted regarding the referral for: CT scan high resolution of chest

## 2017-09-03 NOTE — Progress Notes (Signed)
Subjective:    Patient ID: Brad Ray, male    DOB: 09-08-1960, 57 y.o.   MRN: [id number]  HPI  Here to c/o acute onset sharp left lower rib/costal margin pain, sort of popping sensation or some kind of pull, different from a runners cramp, has some worsening with deep breathing, but not exertional or o/w positional, and not assoc with diaphoresis, n/v, palp or worsening dizziness.  Did have recent CT chest with suggestion of possible ILD.   Pt denies fever, wt loss, night sweats, loss of appetite, or other constitutional symptoms No cough, wheezing.  Pt denies new neurological symptoms such as new headache, or facial or extremity weakness or numbness   Pt denies polydipsia, polyuria Past Medical History:  Diagnosis Date  . Abdominal pain, right upper quadrant 12/09/2007  . ALLERGIC RHINITIS 03/19/2007  . Allergy   . DEGENERATIVE JOINT DISEASE, LUMBAR SPINE 12/23/2006  . [place] DISEASE, CERVICAL 12/23/2006  . FATIGUE 04/28/2009  . GERD 12/23/2006  . HYPERCHOLESTEROLEMIA 12/23/2006  . HYPERLIPIDEMIA 03/19/2007  . HYPERSOMNIA 04/28/2009  . HYPERTENSION 03/19/2007  . HYPOTENSION 04/13/2009  . INSOMNIA, HX OF 12/23/2006  . LIVER MASS 12/11/2007  . MENIERE'S DISEASE 12/23/2006  . OTITIS MEDIA, LEFT 04/13/2009  . PERFORATION, TYMPANIC MEMBRANE NOS 12/23/2006   Past Surgical History:  Procedure Laterality Date  . left knee (MCL) repair    . s/p left ear surgury for vertigo    . s/p left knee medial collateral ligament damage      reports that he has never smoked. He has never used smokeless tobacco. He reports that he drinks alcohol. He reports that he does not use drugs. family history includes Alcohol abuse in his other; Diabetes in his father; Heart disease in his father; Hypertension in his father. Allergies  Allergen Reactions  . Penicillins     Childhood reaction   Current Outpatient Medications on File Prior to Visit  Medication Sig Dispense Refill  . ARMOUR THYROID 90 MG tablet Take 1  tablet by mouth daily.  2  . diazepam (VALIUM) 5 MG tablet Take 1 tablet (5 mg total) by mouth 3 times/day as needed-between meals & bedtime. 30 tablet 0  . Multiple Vitamins-Minerals (CENTRUM SILVER PO) Take 1 each by mouth daily.      . tadalafil (CIALIS) 20 MG tablet TAKE ONE TABLET BY MOUTH ONCE DAILY AS NEEDED FOR ERECTICLE DYSFUNCTION 10 tablet 11  . telmisartan (MICARDIS) 80 MG tablet Take 1 tablet (80 mg total) by mouth daily. 90 tablet 3  . triamcinolone (NASACORT) 55 MCG/ACT AERO nasal inhaler Place 2 sprays into the nose daily. 1 Inhaler 12  . vitamin B-12 (CYANOCOBALAMIN) 500 MCG tablet Take 500 mcg by mouth daily.      . vitamin C (ASCORBIC ACID) 500 MG tablet Take 1,000 mg by mouth daily.      Marland Kitchen zolpidem (AMBIEN) 10 MG tablet TAKE 1 TABLET BY MOUTH EVERYDAY AT BEDTIME 90 tablet 3   No current facility-administered medications on file prior to visit.    Review of Systems  Constitutional: Negative for other unusual diaphoresis or sweats HENT: Negative for ear discharge or swelling Eyes: Negative for other worsening visual disturbances Respiratory: Negative for stridor or other swelling  Gastrointestinal: Negative for worsening distension or other blood Genitourinary: Negative for retention or other urinary change Musculoskeletal: Negative for other MSK pain or swelling Skin: Negative for color change or other new lesions Neurological: Negative for worsening tremors and other numbness  Psychiatric/Behavioral: Negative  for worsening agitation or other fatigue All other system neg per pt    Objective:   Physical Exam BP 120/82 (BP Location: Left Arm, Patient Position: Sitting, Cuff Size: Large)   Pulse 87   Ht 6\' 3"  (1.905 m)   Wt 252 lb (114.3 kg)   SpO2 95%   BMI 31.50 kg/m  VS noted,  Constitutional: Pt appears in NAD HENT: Head: NCAT.  Right Ear: External ear normal.  Left Ear: External ear normal.  Eyes: . Pupils are equal, round, and reactive to light.  Conjunctivae and EOM are normal Nose: without d/c or deformity Neck: Neck supple. Gross normal ROM Cardiovascular: Normal rate and regular rhythm.   Pulmonary/Chest: Effort normal and breath sounds without rales or wheezing.  Left costal margin with mild tender Abd:  Soft, NT, ND, + BS, no organomegaly Neurological: Pt is alert. At baseline orientation, motor grossly intact Skin: Skin is warm. No rashes, other new lesions, no LE edema Psychiatric: Pt behavior is normal without agitation  No other exam findings Lab Results  Component Value Date   WBC 7.4 07/15/2017   HGB 16.1 07/15/2017   HCT 47.7 07/15/2017   PLT 268.0 07/15/2017   GLUCOSE 88 07/15/2017   CHOL 234 (H) 07/15/2017   TRIG 388.0 (H) 07/15/2017   HDL 44.60 07/15/2017   LDLDIRECT 133.0 07/15/2017   LDLCALC 155 (H) 12/11/2016   ALT 21 07/15/2017   AST 17 07/15/2017   NA 137 07/15/2017   K 4.6 07/15/2017   CL 104 07/15/2017   CREATININE 1.07 07/15/2017   BUN 13 07/15/2017   CO2 26 07/15/2017   TSH 4.61 (H) 07/15/2017   PSA 1.38 07/15/2017   HGBA1C 5.3 08/03/2015   MICROALBUR 1.3 08/03/2015       Assessment & Plan:

## 2017-09-04 ENCOUNTER — Ambulatory Visit (INDEPENDENT_AMBULATORY_CARE_PROVIDER_SITE_OTHER)
Admission: RE | Admit: 2017-09-04 | Discharge: 2017-09-04 | Disposition: A | Discharge status: Home / Self Care | Payer: BLUE CROSS/BLUE SHIELD | Source: Ambulatory Visit | Attending: Internal Medicine | Admitting: Internal Medicine

## 2017-09-04 DIAGNOSIS — R079 Chest pain, unspecified: Secondary | ICD-10-CM

## 2017-09-04 DIAGNOSIS — J849 Interstitial pulmonary disease, unspecified: Secondary | ICD-10-CM | POA: Diagnosis not present

## 2017-09-04 NOTE — Assessment & Plan Note (Signed)
Ok for CT chest high res, consider pulm referral

## 2017-09-04 NOTE — Assessment & Plan Note (Signed)
Suspect c/w MSK,  to f/u any worsening symptoms or concerns

## 2017-09-04 NOTE — Assessment & Plan Note (Signed)
stable overall by history and exam, recent data reviewed with pt, and pt to continue medical treatment as before,  to f/u any worsening symptoms or concerns BP Readings from Last 3 Encounters:  09/03/17 120/82  07/17/17 114/68  01/15/17 114/79

## 2017-09-05 ENCOUNTER — Telehealth: Payer: Self-pay

## 2017-09-05 ENCOUNTER — Telehealth: Payer: Self-pay | Admitting: Internal Medicine

## 2017-09-05 NOTE — Telephone Encounter (Signed)
Copied from [place] [id number]. Topic: Inquiry >> Sep 05, 2017  9:19 AM Margot Ables wrote: Reason for CRM: pt is calling stating he is very nervous about results from CT scan given issues from the last report. Pt states that he got a call within a few hours last time. He is requesting f/u with radiology and call back.

## 2017-09-05 NOTE — Telephone Encounter (Signed)
-----   Message from Biagio Borg, MD sent at 09/05/2017  2:46 PM EDT ----- Left message on MyChart, pt to cont same tx except  The test results show that your current treatment is OK, as the "ground glass" abnormality has only minimal residual and appears to be nearly healed.  The reason is not exactly clear, but most likely was some infection that is now nearly resolved,  There is no other suggestion of serious lung disease such as Interstitial Lung Disease   Incidentally, there is a small kidney stone on the right which is not causing a problem, but you should be aware in case you have right flank or abdominal pain or blood in the urine sometime in the future  Brad Ray to please inform pt, no further referral or tx needed at this time

## 2017-09-05 NOTE — Telephone Encounter (Signed)
No, since the recent CT cardiac score was 87 which is mild, and would not normally require to see cardiology or stress testing

## 2017-09-05 NOTE — Telephone Encounter (Signed)
Pt has been informed and expressed understanding. He stated that he is anxious and doesn't understand why its taking so long. I informed him normally we do have results back quickly but sometimes when the radiologist is backed up with interpretations it takes a while. I informed him that I would reach back out once Dr. Jenny Reichmann has received the results.

## 2017-09-05 NOTE — Telephone Encounter (Signed)
Pt has been informed and expressed understanding.   Dr Jenny Reichmann- Pt stated on the cardiac scoring result it was an addendum made by the radiologist stating that he had plaque build up and he wanted to know if he should be concerned and what are your thought on that? Please advise.

## 2017-09-05 NOTE — Telephone Encounter (Signed)
For some reason, the test has not resulted yet.  We normally are notified immediately after the radiologist has made their interpretation.  We will try to let him know later today

## 2017-09-08 NOTE — Telephone Encounter (Signed)
Pt has been informed and expressed understanding.  

## 2017-09-26 DIAGNOSIS — H43811 Vitreous degeneration, right eye: Secondary | ICD-10-CM | POA: Diagnosis not present

## 2017-12-02 ENCOUNTER — Telehealth: Payer: Self-pay | Admitting: Internal Medicine

## 2017-12-02 ENCOUNTER — Ambulatory Visit: Payer: Self-pay | Admitting: Internal Medicine

## 2017-12-02 ENCOUNTER — Ambulatory Visit: Payer: BLUE CROSS/BLUE SHIELD | Admitting: Internal Medicine

## 2017-12-02 NOTE — Telephone Encounter (Signed)
I agree ED is appropriate to expedite further evaluation and treatment

## 2017-12-02 NOTE — Telephone Encounter (Signed)
He called in c/o left flank pain that is radiating around to his left abd and a full feeling in the same area.   Denies any urinary symptoms when asked.   He had a CT scan that showed a kidney stone on the right side "a while back".   The pain is on the left side but feels the same as it was on the right side when the kidney stone was found on CT.  (While talking to him I could tell he was in a lot of discomfort in his voice and his breathing.   Soft groins and breathing as in pain).  "I have a high tolerance for pain but this pain is getting worse".  He was insistent that he just needed to come in and see Dr. Cathlean Cower today after I informed him he needed to go to the ED.   I explained to him why he needed to go to the ED because they can give him pain medication and do a CT scan to determine what is wrong.   We can't do that in the office.  "I'm not going to the ED and sitting there for hours".    "This is not an emergency".   I called and spoke to the flow coordinator at [facility].   They checked with Dr. Jenny Reichmann and his instructions were for the pt to go to the ED for the CT scan and IV pain medication.    I let the pt know Dr. Gwynn Burly instructions.   He again refused to go to the ED.  "He can just schedule me for a CT scan if that's what he needs".   I let him know that they can do the CT there in the ED and find out what's wrong plus given him pain medication.    He then replied,  "The pain is not that bad".  (Versus what he told me above).  I offered to have someone from the office call him.   He said,  "That would be fine".   "I just need to see Dr. Jenny Reichmann today".   After ending the call I again called the office and spoke with a different flow coordinator and let her know the situation.   She requested I send over my triage notes and she will see that Dr. Jenny Reichmann is made aware.   If Dr. Jenny Reichmann has already instructed him to go to the ED and he is refusing not sure what we can do.   Send over triage  notes.   There are not any appts available today.    Since the disposition was to the ED I did not made an appt per our protocol plus the fact Dr. Jenny Reichmann had already  Instructed him to go to the ED also.  I routed this note over to Dr. Gwynn Burly nurse pool for disposition.       Reason for Disposition . [1] SEVERE pain (e.g., excruciating, scale 8-10) AND [2] present > 1 hour  Answer Assessment - Initial Assessment Questions 1. LOCATION: "Where does it hurt?" (e.g., left, right)     Started left side of my lower back and is going around to my abd.   I have a kidney stone of the right side.   The pain is moving from the back to front. 2. ONSET: "When did the pain start?"     I get back pain at times.   This weekend the pain started moving to  the front. 3. SEVERITY: "How bad is the pain?" (e.g., Scale 1-10; mild, moderate, or severe)   - MILD (1-3): doesn't interfere with normal activities    - MODERATE (4-7): interferes with normal activities or awakens from sleep    - SEVERE (8-10): excruciating pain and patient unable to do normal activities (stays in bed)       10 on pain scale 4. PATTERN: "Does the pain come and go, or is it constant?"      Constant 5. CAUSE: "What do you think is causing the pain?"     Maybe a kidney stone 6. OTHER SYMPTOMS:  "Do you have any other symptoms?" (e.g., fever, abdominal pain, vomiting, leg weakness, burning with urination, blood in urine)     Abd pain too.   7. PREGNANCY:  "Is there any chance you are pregnant?" "When was your last menstrual period?"     N/A  Protocols used: FLANK PAIN-A-AH

## 2017-12-02 NOTE — Telephone Encounter (Signed)
Patient called back again about the L side pain. I spoke with patient this time, because of the nurse note he was told to go to the ED. Patient states yes they found kidney stones before, but they was not the problem. And his pain is on the L side not the R side. He states he never said he had serve pain or that it was a kidney stone. Patient was upset over he just needed an appointment. I have made him an appointment with Dr. Jenny Reichmann on 9/25.

## 2017-12-03 ENCOUNTER — Other Ambulatory Visit (INDEPENDENT_AMBULATORY_CARE_PROVIDER_SITE_OTHER): Payer: BLUE CROSS/BLUE SHIELD

## 2017-12-03 ENCOUNTER — Ambulatory Visit (INDEPENDENT_AMBULATORY_CARE_PROVIDER_SITE_OTHER): Payer: BLUE CROSS/BLUE SHIELD | Admitting: Family

## 2017-12-03 ENCOUNTER — Ambulatory Visit (INDEPENDENT_AMBULATORY_CARE_PROVIDER_SITE_OTHER)
Admission: RE | Admit: 2017-12-03 | Discharge: 2017-12-03 | Disposition: A | Discharge status: Home / Self Care | Payer: BLUE CROSS/BLUE SHIELD | Source: Ambulatory Visit | Attending: Family | Admitting: Family

## 2017-12-03 ENCOUNTER — Encounter: Payer: Self-pay | Admitting: Family

## 2017-12-03 VITALS — BP 132/82 | HR 87 | Temp 98.0°F | Ht 75.0 in | Wt 249.1 lb

## 2017-12-03 DIAGNOSIS — J984 Other disorders of lung: Secondary | ICD-10-CM | POA: Diagnosis not present

## 2017-12-03 DIAGNOSIS — R109 Unspecified abdominal pain: Secondary | ICD-10-CM

## 2017-12-03 LAB — CBC WITH DIFFERENTIAL/PLATELET
BASOS PCT: 0.5 % (ref 0.0–3.0)
Basophils Absolute: 0 10*3/uL (ref 0.0–0.1)
EOS PCT: 4.3 % (ref 0.0–5.0)
Eosinophils Absolute: 0.3 10*3/uL (ref 0.0–0.7)
HEMATOCRIT: 47.6 % (ref 39.0–52.0)
Hemoglobin: 16.2 g/dL (ref 13.0–17.0)
LYMPHS ABS: 2.7 10*3/uL (ref 0.7–4.0)
LYMPHS PCT: 42.7 % (ref 12.0–46.0)
MCHC: 34 g/dL (ref 30.0–36.0)
MCV: 88.8 fl (ref 78.0–100.0)
Monocytes Absolute: 0.6 10*3/uL (ref 0.1–1.0)
Monocytes Relative: 10 % (ref 3.0–12.0)
NEUTROS ABS: 2.7 10*3/uL (ref 1.4–7.7)
NEUTROS PCT: 42.5 % — AB (ref 43.0–77.0)
PLATELETS: 245 10*3/uL (ref 150.0–400.0)
RBC: 5.37 Mil/uL (ref 4.22–5.81)
RDW: 13.2 % (ref 11.5–15.5)
WBC: 6.4 10*3/uL (ref 4.0–10.5)

## 2017-12-03 LAB — COMPREHENSIVE METABOLIC PANEL
ALT: 15 U/L (ref 0–53)
AST: 14 U/L (ref 0–37)
Albumin: 3.9 g/dL (ref 3.5–5.2)
Alkaline Phosphatase: 60 U/L (ref 39–117)
BUN: 12 mg/dL (ref 6–23)
CHLORIDE: 102 meq/L (ref 96–112)
CO2: 30 meq/L (ref 19–32)
Calcium: 9.1 mg/dL (ref 8.4–10.5)
Creatinine, Ser: 0.98 mg/dL (ref 0.40–1.50)
GFR: 84.32 mL/min (ref >60.00)
GLUCOSE: 87 mg/dL (ref 70–99)
POTASSIUM: 4.3 meq/L (ref 3.5–5.1)
Sodium: 138 mEq/L (ref 135–145)
Total Bilirubin: 0.3 mg/dL (ref 0.2–1.2)
Total Protein: 7.4 g/dL (ref 6.0–8.3)

## 2017-12-03 LAB — URINALYSIS
BILIRUBIN URINE: NEGATIVE
HGB URINE DIPSTICK: NEGATIVE
KETONES UR: NEGATIVE
LEUKOCYTES UA: NEGATIVE
Nitrite: NEGATIVE
SPECIFIC GRAVITY, URINE: 1.015 (ref 1.000–1.030)
Total Protein, Urine: NEGATIVE
UROBILINOGEN UA: 0.2 (ref 0.0–1.0)
Urine Glucose: NEGATIVE
pH: 6 (ref 5.0–8.0)

## 2017-12-03 LAB — LIPASE: LIPASE: 42 U/L (ref 11.0–59.0)

## 2017-12-03 LAB — AMYLASE: AMYLASE: 48 U/L (ref 27–131)

## 2017-12-03 NOTE — Telephone Encounter (Signed)
Looks like he was a no show yesterday at 11:20 not sure if patient is going to try and show today at 11:00 since it says 9/25 in the note?

## 2017-12-03 NOTE — Progress Notes (Signed)
Brad Ray is a 57 y.o. male with the following history as recorded in Epic[employer]:  Patient Active Problem List   Diagnosis Date Noted  . Chest pain 09/03/2017  . Abnormal radiologic finding of lung field 09/03/2017  . Increased prostate specific antigen (PSA) velocity 10/31/2016  . Erectile dysfunction 08/08/2015  . Right flank pain 08/08/2015  . Cervical radiculopathy 10/19/2014  . Abnormal MRI 01/12/2014  . Hypothyroidism 01/12/2014  . Hypogonadism male 03/17/2013  . Conjunctivitis of both eyes 03/13/2012  . Preventative health care 09/09/2010  . HYPERSOMNIA 04/28/2009  . FATIGUE 04/28/2009  . Hyperlipidemia 03/19/2007  . Essential hypertension 03/19/2007  . Allergic rhinitis 03/19/2007  . PERFORATION, TYMPANIC MEMBRANE NOS 12/23/2006  . MENIERE'S DISEASE 12/23/2006  . GERD 12/23/2006  . DEGENERATIVE JOINT DISEASE, LUMBAR SPINE 12/23/2006  . [place] DISEASE, CERVICAL 12/23/2006  . INSOMNIA, HX OF 12/23/2006    Current Outpatient Medications  Medication Sig Dispense Refill  . ARMOUR THYROID 90 MG tablet Take 1 tablet by mouth daily.  2  . diazepam (VALIUM) 5 MG tablet Take 1 tablet (5 mg total) by mouth 3 times/day as needed-between meals & bedtime. 30 tablet 0  . Multiple Vitamins-Minerals (CENTRUM SILVER PO) Take 1 each by mouth daily.      . tadalafil (CIALIS) 20 MG tablet TAKE ONE TABLET BY MOUTH ONCE DAILY AS NEEDED FOR ERECTICLE DYSFUNCTION 10 tablet 11  . telmisartan (MICARDIS) 80 MG tablet Take 1 tablet (80 mg total) by mouth daily. 90 tablet 3  . triamcinolone (NASACORT) 55 MCG/ACT AERO nasal inhaler Place 2 sprays into the nose daily. 1 Inhaler 12  . vitamin B-12 (CYANOCOBALAMIN) 500 MCG tablet Take 500 mcg by mouth daily.      . vitamin C (ASCORBIC ACID) 500 MG tablet Take 1,000 mg by mouth daily.      Marland Kitchen zolpidem (AMBIEN) 10 MG tablet TAKE 1 TABLET BY MOUTH EVERYDAY AT BEDTIME 90 tablet 3   No current facility-administered medications for this visit.      Allergies: Penicillins  Past Medical History:  Diagnosis Date  . Abdominal pain, right upper quadrant 12/09/2007  . ALLERGIC RHINITIS 03/19/2007  . Allergy   . DEGENERATIVE JOINT DISEASE, LUMBAR SPINE 12/23/2006  . [place] DISEASE, CERVICAL 12/23/2006  . FATIGUE 04/28/2009  . GERD 12/23/2006  . HYPERCHOLESTEROLEMIA 12/23/2006  . HYPERLIPIDEMIA 03/19/2007  . HYPERSOMNIA 04/28/2009  . HYPERTENSION 03/19/2007  . HYPOTENSION 04/13/2009  . INSOMNIA, HX OF 12/23/2006  . LIVER MASS 12/11/2007  . MENIERE'S DISEASE 12/23/2006  . OTITIS MEDIA, LEFT 04/13/2009  . PERFORATION, TYMPANIC MEMBRANE NOS 12/23/2006    Past Surgical History:  Procedure Laterality Date  . left knee (MCL) repair    . s/p left ear surgury for vertigo    . s/p left knee medial collateral ligament damage      Family History  Problem Relation Age of Onset  . Diabetes Father   . Hypertension Father   . Heart disease Father   . Alcohol abuse Other   . Colon cancer Neg Hx   . Pancreatic cancer Neg Hx   . Stomach cancer Neg Hx   . Esophageal cancer Neg Hx   . Rectal cancer Neg Hx     Social History   Tobacco Use  . Smoking status: Never Smoker  . Smokeless tobacco: Never Used  Substance Use Topics  . Alcohol use: Yes    Comment: weekly beer    Subjective:  Patient presents with concerns for LUQ pain x  1 week; originally injured the area while deep sea fishing in May; was seen and chest CT was done at that time; no acute symptoms until recently; in the past week, LUQ pain localized under his left rib; "feels like something is internal." No fever or blood in urine; no changes in bowel habits; + occasional nausea in the past week- does still have his gallbladder; no chest pain, no shortness of breath.   Objective:  Vitals:   12/03/17 1436  BP: 132/82  Pulse: 87  Temp: 98 F (36.7 C)  TempSrc: Oral  SpO2: 97%  Weight: 249 lb 1.3 oz (113 kg)  Height: 6' 3"  (1.905 m)    General: Well developed, well nourished, in no  acute distress  Skin : Warm and dry.  Head: Normocephalic and atraumatic  Eyes: Sclera and conjunctiva clear; pupils round and reactive to light; extraocular movements intact  Ears: External normal; canals clear; tympanic membranes normal  Oropharynx: Pink, supple. No suspicious lesions  Neck: Supple without thyromegaly, adenopathy  Lungs: Respirations unlabored;  Abdomen: Soft; nontender; nondistended; normoactive bowel sounds; no masses or hepatosplenomegaly; tender to palpation over LUQ Musculoskeletal: No deformities; no active joint inflammation  Extremities: No edema, cyanosis, clubbing  Vessels: Symmetric bilaterally  Neurologic: Alert and oriented; speech intact; face symmetrical; moves all extremities well; CNII-XII intact without focal deficit   Assessment:  1. Left flank pain     Plan:  ? Etiology; symptoms concerning for possible pancreatitis vs muscular source; update left rib X-ray and abdominal CT; update labs today; follow-up to be determined.   No follow-ups on file.  Orders Placed This Encounter  Procedures  . DG Ribs Unilateral Left    Standing Status:   Future    Standing Expiration Date:   02/03/2019    Order Specific Question:   Reason for Exam (SYMPTOM  OR DIAGNOSIS REQUIRED)    Answer:   left rib pain    Order Specific Question:   Preferred imaging location?    Answer:   [facility]    Order Specific Question:   Radiology Contrast Protocol - do NOT remove file path    Answer:   \\[email or web address]  . CT ABDOMEN PELVIS W CONTRAST    SS. Cecille Rubin [phone]/ [employer] Not Diabetic / no labs req    Standing Status:   Future    Standing Expiration Date:   03/05/2019    Order Specific Question:   If indicated for the ordered procedure, I authorize the administration of contrast media per Radiology protocol    Answer:   Yes    Order Specific Question:   Preferred imaging location?    Answer:   [facility]    Order  Specific Question:   Is Oral Contrast requested for this exam?    Answer:   Yes, Per Radiology protocol    Order Specific Question:   Radiology Contrast Protocol - do NOT remove file path    Answer:   \\[email or web address].pdf  . CBC w/Diff    Standing Status:   Future    Standing Expiration Date:   12/03/2018  . Comp Met (CMET)    Standing Status:   Future    Standing Expiration Date:   12/03/2018  . Amylase    Standing Status:   Future    Standing Expiration Date:   12/03/2018  . Lipase    Standing Status:   Future    Standing Expiration Date:   12/03/2018  . Urinalysis  Standing Status:   Future    Standing Expiration Date:   12/03/2018    Requested Prescriptions    No prescriptions requested or ordered in this encounter

## 2017-12-03 NOTE — Telephone Encounter (Signed)
He was not a No show, I made the appointment wrong. Please advise if Dr. Jenny Reichmann would like to see him or if he still wants him to go to the ED?

## 2017-12-03 NOTE — Telephone Encounter (Signed)
Dr. Jenny Reichmann said he does not need to see the patient, he needs to go to the ED. I called patient to let him know he does not have an appointment to day and for him to go to the ED. I had to leave this on a VM. If he calls back, please let him know this.

## 2017-12-04 ENCOUNTER — Telehealth: Payer: Self-pay | Admitting: Family

## 2017-12-04 ENCOUNTER — Other Ambulatory Visit: Payer: Self-pay | Admitting: Family

## 2017-12-04 ENCOUNTER — Ambulatory Visit (INDEPENDENT_AMBULATORY_CARE_PROVIDER_SITE_OTHER)
Admission: RE | Admit: 2017-12-04 | Discharge: 2017-12-04 | Disposition: A | Discharge status: Home / Self Care | Payer: BLUE CROSS/BLUE SHIELD | Source: Ambulatory Visit | Attending: Family | Admitting: Family

## 2017-12-04 DIAGNOSIS — R109 Unspecified abdominal pain: Secondary | ICD-10-CM | POA: Diagnosis not present

## 2017-12-04 DIAGNOSIS — R1012 Left upper quadrant pain: Secondary | ICD-10-CM | POA: Diagnosis not present

## 2017-12-04 MED ORDER — IOPAMIDOL (ISOVUE-300) INJECTION 61%
100.0000 mL | Freq: Once | INTRAVENOUS | Status: AC | PRN
  Administered 2017-12-04: 100 mL via INTRAVENOUS

## 2017-12-04 MED ORDER — DOXYCYCLINE HYCLATE 100 MG PO TABS: 100.0000 mg | ORAL_TABLET | Freq: Two times a day (BID) | ORAL | 0 refills | Status: DC

## 2017-12-04 NOTE — Telephone Encounter (Signed)
I called patient back about his CT results as he requested; unable to reach him at home or cell number; I left a VM explaining that he did still have non-obstructing right sided kidney stone, pneumonia on the left. I asked him to call and speak to a nurse tomorrow to get more detailed information regarding the CT.

## 2017-12-04 NOTE — Telephone Encounter (Signed)
Copied from [place] [id number]. Topic: Inquiry >> Dec 04, 2017  4:22 PM Margot Ables wrote: Reason for CRM: Pt states he completed CT 1 1/2 hours ago and said Mickel Baas advised him it was "STAT" so he was hoping for results by end of day today. Please advise.

## 2017-12-04 NOTE — Telephone Encounter (Signed)
Just spoke with patient.  Will have Brad Ray call him as he had additional questions that I was not able to answer for him.

## 2017-12-05 ENCOUNTER — Other Ambulatory Visit: Payer: Self-pay | Admitting: Family

## 2017-12-05 DIAGNOSIS — R079 Chest pain, unspecified: Secondary | ICD-10-CM

## 2017-12-05 MED ORDER — TRAMADOL HCL 50 MG PO TABS: 50.0000 mg | ORAL_TABLET | Freq: Three times a day (TID) | ORAL | 0 refills | Status: DC | PRN

## 2017-12-05 NOTE — Telephone Encounter (Signed)
Patient is requesting to speak to [name] He is trying to determine should he travel [phone]

## 2017-12-05 NOTE — Telephone Encounter (Signed)
Pt does not want to talk with a nurse. Pt would like Brad Ray to return his call

## 2017-12-05 NOTE — Telephone Encounter (Signed)
I spoke to patient regarding recent CT results; agree with patient's request to see pulmonology; referral done and Tramadol sent for pain. He is also aware that hiatal hernia and small left inguinal hernia are presents; defers PPI for hiatal hernia and defers seeing surgeon regarding hiatal hernia.

## 2017-12-05 NOTE — Telephone Encounter (Signed)
Pt is calling back again. Pt is aware laura is seeing patient

## 2017-12-09 ENCOUNTER — Encounter: Payer: Self-pay | Admitting: Internal Medicine

## 2017-12-09 ENCOUNTER — Ambulatory Visit (INDEPENDENT_AMBULATORY_CARE_PROVIDER_SITE_OTHER): Payer: BLUE CROSS/BLUE SHIELD | Admitting: Internal Medicine

## 2017-12-09 VITALS — BP 122/78 | HR 74 | Ht 74.5 in | Wt 244.8 lb

## 2017-12-09 DIAGNOSIS — J302 Other seasonal allergic rhinitis: Secondary | ICD-10-CM

## 2017-12-09 DIAGNOSIS — R079 Chest pain, unspecified: Secondary | ICD-10-CM | POA: Diagnosis not present

## 2017-12-09 DIAGNOSIS — R918 Other nonspecific abnormal finding of lung field: Secondary | ICD-10-CM

## 2017-12-09 NOTE — Progress Notes (Signed)
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 Pulmonary Consult    Referred by Dr. [name].  Pt c/o left side pain off and on for about a year. He had a CT chest in June 2019 and he was told there could be some lung dz. He has occ cough and SOB. He gets winded walking up stairs.   Dyspnea:  Runs treadmills x 30 min x 4-5 mph x 3-4 degrees 3-4 and eliptical Cough: min am > slt yellow  Sleep: sleeps on side/ bed flat/ one pillow  SABA use: none   Pain is better on day of office visit and p 5 days of doxy  Typical scenario pain can last week at time and crampy eventually resolves with or without rx and tends to ease up  When supine   No obvious day to day or daytime variability or assoc ongoing  excess/ purulent sputum or mucus plugs or hemoptysis or   chest tightness, subjective wheeze or overt sinus or hb symptoms.   Sleeps as above  without nocturnal  or early am exacerbation  of respiratory  c/o's or need for noct saba. Also denies any obvious fluctuation of symptoms with weather or environmental changes or other aggravating or alleviating factors except as outlined above   No unusual exposure hx or h/o childhood pna/ asthma or knowledge of premature birth.  Current Allergies, Complete Past Medical History, Past Surgical History, Family History, and Social History were reviewed in [facility] record.  ROS  The  following are not active complaints unless bolded Hoarseness, sore throat, dysphagia, dental problems, itching, sneezing,  nasal congestion or discharge of excess mucus or purulent secretions, ear ache,   fever, chills, sweats, unintended wt loss or wt gain, classically pleuritic or exertional cp,  orthopnea pnd or arm/hand swelling  or leg swelling, presyncope, palpitations, abdominal pain/bloating, anorexia, nausea, vomiting, diarrhea  or change in bowel habits or change in bladder habits, change in stools or change in urine, dysuria, hematuria,  rash, arthralgias, visual complaints, headache, numbness, weakness or ataxia or problems with walking or coordination,  change in mood or  memory.           Past Medical History:  Diagnosis Date  . Abdominal pain, right upper quadrant 12/09/2007  . ALLERGIC RHINITIS 03/19/2007  . Allergy   . DEGENERATIVE JOINT DISEASE, LUMBAR SPINE 12/23/2006  . [place] DISEASE, CERVICAL 12/23/2006  . FATIGUE 04/28/2009  . GERD 12/23/2006  . HYPERCHOLESTEROLEMIA 12/23/2006  . HYPERLIPIDEMIA 03/19/2007  . HYPERSOMNIA 04/28/2009  . HYPERTENSION 03/19/2007  . HYPOTENSION 04/13/2009  . INSOMNIA, HX OF 12/23/2006  . LIVER MASS 12/11/2007  . MENIERE'S DISEASE 12/23/2006  . OTITIS MEDIA, LEFT 04/13/2009  . PERFORATION, TYMPANIC MEMBRANE NOS 12/23/2006    Outpatient Medications Prior to  Visit  Medication Sig Dispense Refill  . ARMOUR THYROID 90 MG tablet Take 1 tablet by mouth daily.  2  . diazepam (VALIUM) 5 MG tablet Take 1 tablet (5 mg total) by mouth 3 times/day as needed-between meals & bedtime. 30 tablet 0  . doxycycline (VIBRA-TABS) 100 MG tablet Take 1 tablet (100 mg total) by mouth 2 (two) times daily. 20 tablet 0  . Multiple Vitamin (MULTIVITAMIN) capsule Take 1 capsule by mouth daily.    . Multiple Vitamins-Minerals (CENTRUM SILVER PO) Take 1 each by mouth daily.      . tadalafil (CIALIS) 20 MG tablet TAKE ONE TABLET BY MOUTH ONCE DAILY AS NEEDED FOR ERECTICLE  DYSFUNCTION 10 tablet 11  . telmisartan (MICARDIS) 80 MG tablet Take 1 tablet (80 mg total) by mouth daily. 90 tablet 3  . traMADol (ULTRAM) 50 MG tablet Take 1 tablet (50 mg total) by mouth every 8 (eight) hours as needed. 20 tablet 0  . vitamin B-12 (CYANOCOBALAMIN) 500 MCG tablet Take 500 mcg by mouth daily.      . vitamin C (ASCORBIC ACID) 500 MG tablet Take 1,000 mg by mouth daily.      Marland Kitchen zolpidem (AMBIEN) 10 MG tablet TAKE 1 TABLET BY MOUTH EVERYDAY AT BEDTIME 90 tablet 3  . triamcinolone (NASACORT) 55 MCG/ACT AERO nasal inhaler Place 2 sprays into the nose daily. 1 Inhaler 12             Objective:     BP 122/78   Pulse 74   Ht 6' 2.5" (1.892 m)   Wt 244 lb 12.8 oz (111 kg)   SpO2 95%   BMI 31.01 kg/m   SpO2: 95 %   RA   Wt Readings from Last 3 Encounters:  12/09/17 244 lb 12.8 oz (111 kg)  12/03/17 249 lb 1.3 oz (113 kg)  09/03/17 252 lb (114.3 kg)     healthy appearing but anxious wm nad   HEENT: nl dentition, turbinates bilaterally, and oropharynx. Nl external ear canals without cough reflex   NECK :  without JVD/Nodes/TM/ nl carotid upstrokes bilaterally   LUNGS: no acc muscle use,  Nl contour chest which is clear to A and P bilaterally without cough on insp or exp maneuvers   CV:  RRR  no s3 or murmur or increase in P2, and no edema   ABD:  soft and nontender with nl inspiratory excursion in the supine position. No bruits or organomegaly appreciated, bowel sounds nl  MS:  Nl gait/ ext warm without deformities, calf tenderness, cyanosis or clubbing No obvious joint restrictions   SKIN: warm and dry without lesions    NEURO:  alert, approp, nl sensorium with  no motor or cerebellar deficits apparent.      I personally reviewed images and agree with radiology impression as follows:   Chest HRCT  09/04/17  1. Improvement in extent of ground-glass attenuation seen in the lungs bilaterally on the prior study, indicative of a resolving acute process.  Minimal residual ground-glass attenuation is noted in the right lower lobe on today's examination. No definite findings to suggest interstitial lung disease otherwise noted. 2. Mild to moderate air trapping indicative of small airways disease.      Assessment   Chest pain Classic pattern of IBS reported 12/09/2017 > trial of diet/citrucel rec    Classic subdiaphragmatic pain pattern suggests ibs:  Stereotypical, migratory with a very limited distribution of pain locations, daytime, not usually exacerbated by exercise  or coughing, worse in sitting position, frequently associated with generalized abd bloating, not as likely to be present supine due to the dome effect of the diaphragm which  is  canceled in that position. Frequently these patients have had multiple negative GI workups and CT scans.  Treatment consists of avoiding foods that cause gas (especially boiled eggs, mexcican food but especially  beans and undercooked vegetables like  spinach and some salads)  and citrucel 1 heaping tsp twice daily with a large glass of water.  Pain should improve w/in 2 weeks and if not then consider further GI work up.     Seasonal rhinitis Onset in his 64's > rx otc's adequate for now with no evidence asthma   Abnormal radiologic finding of lung field He has now had 3 sets of ct scans all with non-specific fluctuating GG  findings and the most definitive (HRCT) very unimpressive so rec f/u p doxy rx with just a plain cxr to follow this going forward as clearly not of a  malignant origin and no unusual exposures by hx (including vaping).   Discussed in detail all the  indications, usual  risks and alternatives  relative to the benefits with patient who agrees to proceed with conservative f/u as outlined       Total time devoted to counseling  > 50 % of initial 60 min office visit:  review case with pt/ discussion of options/alternatives/ personally creating written customized instructions  in  presence of pt  then going over those specific  Instructions directly with the pt including how to use all of the meds but in particular covering each new medication in detail and the difference between the maintenance= "automatic" meds and the prns using an action plan format for the latter (If this problem/symptom => do that organization reading Left to right).  Please see AVS from this visit for a full list of these instructions which I personally wrote for this pt and  are unique to this visit.      Christinia Gully, MD 12/09/2017

## 2017-12-09 NOTE — Patient Instructions (Addendum)
Classic subdiaphragmatic pain pattern suggests ibs:  Stereotypical, migratory with a very limited distribution of pain locations, daytime, not usually exacerbated by exercise  or coughing, worse in sitting position, frequently associated with generalized abd bloating, not as likely to be present supine due to the dome effect of the diaphragm which  is  canceled in that position. Frequently these patients have had multiple negative GI workups and CT scans.  Treatment consists of avoiding foods that cause gas (especially boiled eggs, mexcican food but especially  beans and undercooked vegetables like  spinach and some salads)  and citrucel 1 heaping tsp twice daily with a large glass of water.  Pain should improve w/in 2 weeks and if not then consider further GI work up.      Please schedule a follow up office visit in 6 weeks, call sooner if needed with CXR on return

## 2017-12-10 ENCOUNTER — Encounter: Payer: Self-pay | Admitting: Internal Medicine

## 2017-12-10 NOTE — Assessment & Plan Note (Signed)
Onset in his 46's > rx otc's adequate for now with no evidence asthma

## 2017-12-10 NOTE — Assessment & Plan Note (Deleted)
He has now had 3 sets of ct scans all with non-specific fluctuating GG  findings and the most definitive (HRCT) very unimpressive so rec f/u p doxy rx with just a plain cxr to follow this going forward as clearly not of a  malignant origin and no unusual exposures by hx (including vaping).   Discussed in detail all the  indications, usual  risks and alternatives  relative to the benefits with patient who agrees to proceed with conservative f/u as outlined       Total time devoted to counseling  > 50 % of initial 60 min office visit:  review case with pt/ discussion of options/alternatives/ personally creating written customized instructions  in presence of pt  then going over those specific  Instructions directly with the pt including how to use all of the meds but in particular covering each new medication in detail and the difference between the maintenance= "automatic" meds and the prns using an action plan format for the latter (If this problem/symptom => do that organization reading Left to right).  Please see AVS from this visit for a full list of these instructions which I personally wrote for this pt and  are unique to this visit.

## 2017-12-10 NOTE — Assessment & Plan Note (Signed)
Classic pattern of IBS reported 12/09/2017 > trial of diet/citrucel rec    Classic subdiaphragmatic pain pattern suggests ibs:  Stereotypical, migratory with a very limited distribution of pain locations, daytime, not usually exacerbated by exercise  or coughing, worse in sitting position, frequently associated with generalized abd bloating, not as likely to be present supine due to the dome effect of the diaphragm which  is  canceled in that position. Frequently these patients have had multiple negative GI workups and CT scans.  Treatment consists of avoiding foods that cause gas (especially boiled eggs, mexcican food but especially  beans and undercooked vegetables like  spinach and some salads)  and citrucel 1 heaping tsp twice daily with a large glass of water.  Pain should improve w/in 2 weeks and if not then consider further GI work up.

## 2017-12-10 NOTE — Assessment & Plan Note (Signed)
He has now had 3 sets of ct scans all with non-specific fluctuating GG  findings and the most definitive (HRCT) very unimpressive so rec f/u p doxy rx with just a plain cxr to follow this going forward as clearly not of a  malignant origin and no unusual exposures by hx (including vaping).   Discussed in detail all the  indications, usual  risks and alternatives  relative to the benefits with patient who agrees to proceed with conservative f/u as outlined       Total time devoted to counseling  > 50 % of initial 60 min office visit:  review case with pt/ discussion of options/alternatives/ personally creating written customized instructions  in presence of pt  then going over those specific  Instructions directly with the pt including how to use all of the meds but in particular covering each new medication in detail and the difference between the maintenance= "automatic" meds and the prns using an action plan format for the latter (If this problem/symptom => do that organization reading Left to right).  Please see AVS from this visit for a full list of these instructions which I personally wrote for this pt and  are unique to this visit.

## 2017-12-17 ENCOUNTER — Telehealth: Payer: Self-pay

## 2017-12-17 DIAGNOSIS — R1032 Left lower quadrant pain: Secondary | ICD-10-CM

## 2017-12-17 DIAGNOSIS — R634 Abnormal weight loss: Secondary | ICD-10-CM

## 2017-12-17 NOTE — Telephone Encounter (Signed)
Copied from [place] [id number]. Topic: Referral - Request >> Dec 17, 2017  3:57 PM Rutherford Nail, [place] wrote: Reason for CRM: patient calling and states that he would like a referral for a gastroenterologist. States that Dr Melvyn Novas did not see pneumonia on any of his scans that were sent over. States that Dr Melvyn Novas diagnosed him with IBS. He is still having the same symptoms as before. Please advise.  CB#: [phone] >> Dec 17, 2017  4:34 PM Para Skeans A wrote: Patient saw [name] on 9/25.

## 2017-12-18 NOTE — Telephone Encounter (Signed)
Dr. Jenny Reichmann please review pt's chart. Would you like for him to come in for a follow up with you or do you have some additional input on what the patient should do? Please advise.

## 2017-12-18 NOTE — Addendum Note (Signed)
Addended by: Biagio Borg on: 12/18/2017 12:52 PM   Modules accepted: Orders

## 2017-12-18 NOTE — Telephone Encounter (Signed)
Pt has been informed and expressed understanding.  

## 2017-12-18 NOTE — Telephone Encounter (Signed)
Pt calling to check status. Pt states he has pain on his left side. Pt states he has lost 17lb in 7-10 days. Please advise Cb#[phone]

## 2017-12-18 NOTE — Telephone Encounter (Signed)
Please advise. Thanks.  

## 2017-12-18 NOTE — Telephone Encounter (Signed)
Ok this is done as "urgent"  Thanks

## 2017-12-19 ENCOUNTER — Encounter: Payer: Self-pay | Admitting: Gastroenterology

## 2017-12-23 ENCOUNTER — Ambulatory Visit (INDEPENDENT_AMBULATORY_CARE_PROVIDER_SITE_OTHER): Payer: BLUE CROSS/BLUE SHIELD | Admitting: Gastroenterology

## 2017-12-23 ENCOUNTER — Encounter: Payer: Self-pay | Admitting: Gastroenterology

## 2017-12-23 VITALS — BP 118/76 | HR 74 | Ht 75.0 in | Wt 240.0 lb

## 2017-12-23 DIAGNOSIS — R0781 Pleurodynia: Secondary | ICD-10-CM | POA: Diagnosis not present

## 2017-12-23 DIAGNOSIS — R1012 Left upper quadrant pain: Secondary | ICD-10-CM | POA: Insufficient documentation

## 2017-12-23 DIAGNOSIS — R10A2 Flank pain, left side: Secondary | ICD-10-CM | POA: Insufficient documentation

## 2017-12-23 DIAGNOSIS — R109 Unspecified abdominal pain: Secondary | ICD-10-CM | POA: Insufficient documentation

## 2017-12-23 MED ORDER — METHYLPREDNISOLONE 4 MG PO TBPK: ORAL_TABLET | ORAL | 0 refills | Status: DC
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
## 2017-12-23 NOTE — Patient Instructions (Addendum)
We sent a prescription for Medrol dose pack to [facility] [address]  You have been scheduled for an endoscopy. Please follow written instructions given to you at your visit today. If you use inhalers (even only as needed), please bring them with you on the day of your procedure.       Normal BMI (Body Mass Index- based on height and weight) is between 19 and 25. Your BMI today is Body mass index is 30 kg/m. Marland Kitchen Please consider follow up  regarding your BMI with your Primary Care Provider.Marland Kitchen

## 2017-12-23 NOTE — Progress Notes (Signed)
Thank you for sending this case to me. I have reviewed the entire note, and the outlined plan seems appropriate.  Not clear this is GI in origin.  However, with unexplained weight loss, and EGD is reasonable.  Wilfrid Lund, MD

## 2017-12-31 ENCOUNTER — Encounter: Payer: Self-pay | Admitting: Gastroenterology

## 2017-12-31 ENCOUNTER — Ambulatory Visit (AMBULATORY_SURGERY_CENTER): Payer: BLUE CROSS/BLUE SHIELD | Admitting: Gastroenterology

## 2017-12-31 VITALS — BP 143/76 | HR 68 | Temp 98.0°F | Resp 23 | Wt 240.0 lb

## 2017-12-31 DIAGNOSIS — R634 Abnormal weight loss: Secondary | ICD-10-CM | POA: Diagnosis not present

## 2017-12-31 DIAGNOSIS — R1012 Left upper quadrant pain: Secondary | ICD-10-CM

## 2017-12-31 DIAGNOSIS — R0781 Pleurodynia: Secondary | ICD-10-CM

## 2017-12-31 DIAGNOSIS — R109 Unspecified abdominal pain: Secondary | ICD-10-CM

## 2017-12-31 MED ORDER — SODIUM CHLORIDE 0.9 % IV SOLN: 500.0000 mL | Freq: Once | INTRAVENOUS | Status: DC

## 2017-12-31 NOTE — Progress Notes (Signed)
Report given to PACU, vss 

## 2017-12-31 NOTE — Patient Instructions (Signed)
YOU HAD AN ENDOSCOPIC PROCEDURE TODAY AT THE [facility]:   Refer to the procedure report that was given to you for any specific questions about what was found during the examination.  If the procedure report does not answer your questions, please call your gastroenterologist to clarify.  If you requested that your care partner not be given the details of your procedure findings, then the procedure report has been included in a sealed envelope for you to review at your convenience later.  YOU SHOULD EXPECT: Some feelings of bloating in the abdomen. Passage of more gas than usual.  Walking can help get rid of the air that was put into your GI tract during the procedure and reduce the bloating. If you had a lower endoscopy (such as a colonoscopy or flexible sigmoidoscopy) you may notice spotting of blood in your stool or on the toilet paper. If you underwent a bowel prep for your procedure, you may not have a normal bowel movement for a few days.  Please Note:  You might notice some irritation and congestion in your nose or some drainage.  This is from the oxygen used during your procedure.  There is no need for concern and it should clear up in a day or so.  SYMPTOMS TO REPORT IMMEDIATELY:   Following upper endoscopy (EGD)  Vomiting of blood or coffee ground material  New chest pain or pain under the shoulder blades  Painful or persistently difficult swallowing  New shortness of breath  Fever of 100F or higher  Black, tarry-looking stools  For urgent or emergent issues, a gastroenterologist can be reached at any hour by calling [phone].   DIET:  We do recommend a small meal at first, but then you may proceed to your regular diet.  Drink plenty of fluids but you should avoid alcoholic beverages for 24 hours.  ACTIVITY:  You should plan to take it easy for the rest of today and you should NOT DRIVE or use heavy machinery until tomorrow (because of the sedation medicines used  during the test).    FOLLOW UP: Our staff will call the number listed on your records the next business day following your procedure to check on you and address any questions or concerns that you may have regarding the information given to you following your procedure. If we do not reach you, we will leave a message.  However, if you are feeling well and you are not experiencing any problems, there is no need to return our call.  We will assume that you have returned to your regular daily activities without incident.  If any biopsies were taken you will be contacted by phone or by letter within the next 1-3 weeks.  Please call us at [phone] if you have not heard about the biopsies in 3 weeks.    SIGNATURES/CONFIDENTIALITY: You and/or your care partner have signed paperwork which will be entered into your electronic medical record.  These signatures attest to the fact that that the information above on your After Visit Summary has been reviewed and is understood.  Full responsibility of the confidentiality of this discharge information lies with you and/or your care-partner. 

## 2017-12-31 NOTE — Op Note (Signed)
[name] Patient Name: Brad Ray Procedure Date: 12/31/2017 9:59 AM MRN: [id number] Endoscopist: [name] L. Loletha Carrow , MD Age: 57 Referring MD:  Date of Birth: [birth year] Gender: Male Account #: [id number] Procedure:                Upper GI endoscopy Indications:              Abdominal pain in the left upper quadrant/lower                            chest wall, Weight loss Medicines:                Monitored Anesthesia Care Procedure:                Pre-Anesthesia Assessment:                           - Prior to the procedure, a History and Physical                            was performed, and patient medications and                            allergies were reviewed. The patient's tolerance of                            previous anesthesia was also reviewed. The risks                            and benefits of the procedure and the sedation                            options and risks were discussed with the patient.                            All questions were answered, and informed consent                            was obtained. Prior Anticoagulants: The patient has                            taken no previous anticoagulant or antiplatelet                            agents. ASA Grade Assessment: II - A patient with                            mild systemic disease. After reviewing the risks                            and benefits, the patient was deemed in                            satisfactory condition to undergo the procedure.  After obtaining informed consent, the endoscope was                            passed under direct vision. Throughout the                            procedure, the patient's blood pressure, pulse, and                            oxygen saturations were monitored continuously. The                            Endoscope was introduced through the mouth, and                            advanced to the second part of  duodenum. The upper                            GI endoscopy was accomplished without difficulty.                            The patient tolerated the procedure well. Scope In: Scope Out: Findings:                 The larynx was normal.                           The esophagus was normal.                           The stomach was normal.                           The cardia and gastric fundus were normal on                            retroflexion.                           The examined duodenum was normal. Complications:            No immediate complications. Estimated Blood Loss:     Estimated blood loss: none. Impression:               - Normal larynx.                           - Normal esophagus.                           - Normal stomach.                           - Normal examined duodenum.                           - No specimens collected.  No cause seen for pain (which appears to be focal                            musculoskeletal pain and tenderness) or weight loss. Recommendation:           - Patient has a contact number available for                            emergencies. The signs and symptoms of potential                            delayed complications were discussed with the                            patient. Return to normal activities tomorrow.                            Written discharge instructions were provided to the                            patient.                           - Resume previous diet.                           - Continue present medications.                           - Return to primary care physician to consider                            musculoskeletal pain control options (i.e. trigger                            point injection). Melana Hingle L. Loletha Carrow, MD 12/31/2017 10:28:09 AM This report has been signed electronically.

## 2018-01-01 ENCOUNTER — Other Ambulatory Visit: Payer: Self-pay | Admitting: Internal Medicine

## 2018-01-01 ENCOUNTER — Telehealth: Payer: Self-pay

## 2018-01-01 DIAGNOSIS — R0789 Other chest pain: Secondary | ICD-10-CM

## 2018-01-01 NOTE — Telephone Encounter (Signed)
  Follow up Call-  Call back number 12/31/2017 01/15/2017  Post procedure Call Back phone  # [phone]  Permission to leave phone message Yes Yes  Some recent data might be hidden     Patient questions:  Do you have a fever, pain , or abdominal swelling? No. Pain Score  0 *   Have you tolerated food without any problems? Yes.    Have you been able to return to your normal activities? Yes.    Do you have any questions about your discharge instructions: Diet   No. Medications  No. Follow up visit  No.  Do you have questions or concerns about your Care? No.  Actions: * If pain score is 4 or above: No action needed, pain <4.

## 2018-01-01 NOTE — Telephone Encounter (Signed)
Ok to let pt know we can refer him to Sports Medicine for a possible trigger point injection

## 2018-01-14 ENCOUNTER — Other Ambulatory Visit: Payer: Self-pay | Admitting: Internal Medicine

## 2018-01-14 NOTE — Telephone Encounter (Signed)
MD approved and sent electronically to pof../lmb  

## 2018-01-14 NOTE — Telephone Encounter (Signed)
Done erx 

## 2018-01-15 ENCOUNTER — Telehealth: Payer: Self-pay | Admitting: Internal Medicine

## 2018-01-15 MED ORDER — LOSARTAN POTASSIUM 100 MG PO TABS: 100.0000 mg | ORAL_TABLET | Freq: Every day | ORAL | 3 refills | Status: DC

## 2018-01-15 NOTE — Telephone Encounter (Signed)
Ok to let pt know, that due to micardis being on back order, we had to change his micardis to losartan 100 mg which is very similar and has the same effect
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
## 2018-01-20 ENCOUNTER — Encounter: Payer: Self-pay | Admitting: Internal Medicine

## 2018-01-20 ENCOUNTER — Ambulatory Visit (INDEPENDENT_AMBULATORY_CARE_PROVIDER_SITE_OTHER): Payer: BLUE CROSS/BLUE SHIELD | Admitting: Internal Medicine

## 2018-01-20 ENCOUNTER — Encounter: Payer: Self-pay | Admitting: Family Medicine

## 2018-01-20 ENCOUNTER — Ambulatory Visit (INDEPENDENT_AMBULATORY_CARE_PROVIDER_SITE_OTHER): Payer: BLUE CROSS/BLUE SHIELD | Admitting: Family Medicine

## 2018-01-20 ENCOUNTER — Ambulatory Visit (INDEPENDENT_AMBULATORY_CARE_PROVIDER_SITE_OTHER)
Admission: RE | Admit: 2018-01-20 | Discharge: 2018-01-20 | Disposition: A | Discharge status: Home / Self Care | Payer: BLUE CROSS/BLUE SHIELD | Source: Ambulatory Visit | Attending: Internal Medicine | Admitting: Internal Medicine

## 2018-01-20 VITALS — BP 130/86 | HR 70 | Ht 75.0 in | Wt 243.0 lb

## 2018-01-20 DIAGNOSIS — Z23 Encounter for immunization: Secondary | ICD-10-CM | POA: Diagnosis not present

## 2018-01-20 DIAGNOSIS — R918 Other nonspecific abnormal finding of lung field: Secondary | ICD-10-CM

## 2018-01-20 DIAGNOSIS — R079 Chest pain, unspecified: Secondary | ICD-10-CM | POA: Diagnosis not present

## 2018-01-20 DIAGNOSIS — J189 Pneumonia, unspecified organism: Secondary | ICD-10-CM | POA: Diagnosis not present

## 2018-01-20 DIAGNOSIS — R0781 Pleurodynia: Secondary | ICD-10-CM | POA: Diagnosis not present

## 2018-01-20 MED ORDER — VITAMIN D (ERGOCALCIFEROL) 1.25 MG (50000 UNIT) PO CAPS: 50000.0000 [IU] | ORAL_CAPSULE | ORAL | 0 refills | Status: DC

## 2018-01-20 NOTE — Patient Instructions (Addendum)
Continue citrucel and diet x minimum of 6 months   Return here short of breath or cough or worse pain    Please remember to go to the  x-ray department downstairs in the basement  for your tests - we will call you with the results when they are available.   Pulmonary follow up is as needed

## 2018-01-20 NOTE — Progress Notes (Signed)
LMTCB

## 2018-01-20 NOTE — Patient Instructions (Signed)
Good to see you  Ice when you need  Once weekly vitamin D for 12 weeks QVAR in haled daily for 2 weeks.  pennsaid pinkie amount topically 2 times daily as needed.  OK to do what you can but listen to your body  See me again in 3 weeks to make sure all better

## 2018-01-20 NOTE — Assessment & Plan Note (Signed)
I believe the patient did have a significant contusion as well as potentially a very small occult fracture of the rib.  I believe it has been healed for some time but unfortunately some underlying atelectasis with potential pneumonia that has resolved also seem to be giving him trouble.  Could have had some inflammatory changes within the long and why patient responded well to the prednisone.  Discussed with patient at great length.  Vitamin D given to help with any other type of healing as well as muscle strength and endurance.  Topical anti-inflammatories given.  Trial of a oral inhaler that I think will also be beneficial if any more inflammatory process within the lungs is contributing.  Patient will follow-up in 3 weeks to make sure completely resolved.  Was already making improvement at this time.

## 2018-01-20 NOTE — Progress Notes (Signed)
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 Pulmonary Consult    Referred by Dr. [name].  Pt c/o left side pain off and on for about a year. He had a CT chest in June 2019 and he was told there could be some lung dz. He has occ cough and SOB. He gets winded walking up stairs.   Dyspnea:  Runs treadmills x 30 min x 4-5 mph x 3-4 degrees 3-4 and eliptical Cough: min am > slt yellow  Sleep: sleeps on side/ bed flat/ one pillow  SABA use: none   Pain is some better on day of office visit and p 5 days of doxy  Typical scenario pain can last week at time and crampy eventually resolves with or without rx and tends to ease up  When supine rec IBS rx   01/20/2018  f/u ov/ re:  L cp best p prednisone /citrucel x 2 weeks / IBS diet  Chief Complaint  Patient presents with  . Follow-up    Breathing is unchanged. His left CP has improved over the past 2 wks.   CP 0% better with citrucel/ 70- 80% better while on prednisone and has not relapsed  Dyspnea:   About the same as prior to the injury Cough: not a bid deal Sleeping: can now lie on L side or any position SABA use: none  Completed pred 2 weeks prior to OV, no baseline esr    No obvious day to day or daytime variability or assoc excess/ purulent sputum or mucus plugs or hemoptysis or cp or chest tightness, subjective wheeze or overt sinus or hb symptoms.   Sleeping as above   without nocturnal  or early am exacerbation  of respiratory  c/o's or need for noct saba. Also denies any obvious fluctuation of symptoms with weather or environmental changes or other aggravating or alleviating factors except as outlined above   No unusual exposure hx or h/o childhood pna/ asthma or knowledge of premature birth.  Current Allergies, Complete Past Medical History, Past Surgical History, Family History, and Social History were reviewed in [facility] record.  ROS  The following are not active complaints unless bolded Hoarseness, sore throat, dysphagia, dental problems, itching, sneezing,  nasal congestion or discharge of excess mucus or purulent secretions, ear ache,   fever, chills, sweats, unintended wt loss or wt gain, classically pleuritic or exertional cp,  orthopnea pnd or arm/hand swelling  or leg swelling, presyncope, palpitations, abdominal pain, anorexia, nausea, vomiting, diarrhea  or change in bowel habits or change in bladder habits, change in stools or change in urine, dysuria, hematuria,  rash, arthralgias, visual complaints, headache, numbness, weakness or ataxia or problems with walking or coordination,  change in mood or  memory.  Current Meds  Medication Sig  . ARMOUR THYROID 90 MG tablet Take 1 tablet by mouth daily.  . diazepam (VALIUM) 5 MG tablet Take 1 tablet (5 mg total) by mouth 3 times/day as needed-between meals & bedtime.  Marland Kitchen losartan (COZAAR) 100 MG tablet Take 1 tablet (100 mg total) by mouth daily.  . methylcellulose (CITRUCEL) oral powder Take by mouth 2 (two) times daily.  . Multiple Vitamin (MULTIVITAMIN) capsule Take 1 capsule by mouth daily.  . tadalafil (CIALIS) 20 MG tablet TAKE ONE TABLET BY MOUTH ONCE DAILY AS NEEDED FOR ERECTICLE DYSFUNCTION  . vitamin B-12 (CYANOCOBALAMIN) 500 MCG tablet Take 500 mcg by mouth daily.    . vitamin C (ASCORBIC ACID) 500 MG tablet Take 1,000 mg by mouth daily.    . Vitamin D,  Ergocalciferol, (DRISDOL) 1.25 MG (50000 UT) CAPS capsule Take 1 capsule (50,000 Units total) by mouth every 7 (seven) days.  Marland Kitchen zolpidem (AMBIEN) 10 MG tablet TAKE 1 TABLET BY MOUTH EVERYDAY AT BEDTIME                     Objective:     amb wm nad     01/20/2018     243   12/09/17 244 lb 12.8 oz (111 kg)  12/03/17 249 lb 1.3 oz (113 kg)  09/03/17 252 lb (114.3 kg)      Vital signs reviewed - Note on arrival 02 sats  94% on RA    HEENT: nl dentition, turbinates bilaterally, and oropharynx. Nl external ear canals without cough reflex   NECK :  without JVD/Nodes/TM/ nl carotid upstrokes bilaterally   LUNGS: no acc muscle use,  Nl contour chest which is clear to A and P bilaterally without cough on insp or exp maneuvers   CV:  RRR  no s3 or murmur or increase in P2, and no edema   ABD:  soft and nontender with nl inspiratory excursion in the supine position. No bruits or organomegaly appreciated, bowel sounds nl  MS:  Nl gait/ ext warm without deformities, calf tenderness, cyanosis or clubbing No obvious joint restrictions   SKIN: warm and dry without lesions    NEURO:  alert, approp, nl sensorium with  no motor or cerebellar deficits apparent.     CXR PA and Lateral:   01/20/2018 :    I personally reviewed images and agree with radiology impression as follows:   No active cardiopulmonary disease.     Assessment

## 2018-01-20 NOTE — Addendum Note (Signed)
Addended by: Earnstine Regal on: 01/20/2018 10:13 AM   Modules accepted: Orders

## 2018-01-21 ENCOUNTER — Encounter: Payer: Self-pay | Admitting: Internal Medicine

## 2018-01-21 NOTE — Progress Notes (Signed)
Spoke with pt and notified of results per Dr. Wert. Pt verbalized understanding and denied any questions. 

## 2018-01-21 NOTE — Assessment & Plan Note (Signed)
No evidence at all of ongoing pulmonary problem here so f/u can be prn worse symptoms - if prednisone is needed in the future for atypical cp/mscp or GG changes on ct, an ESR should be checked first for more objective assessment of the inflammatory component of this problem/ advised.

## 2018-01-21 NOTE — Assessment & Plan Note (Addendum)
Classic pattern of IBS reported 12/09/2017 > trial of diet/citrucel rec > resolved p 2 weeks but only p started course of prednisone    I had an extended final summary discussion with the patient reviewing all relevant studies completed to date and  lasting 15 to 20 minutes of a 25 minute visit on the following issues:   Cannot explain why prednisone would have made so much difference unless there was component of mscp related to previous but remote trauma to the L chest wall.  He had no assoc pleural dz or truly pleuritic component so don't think this was a pulmonary or pleural source here and I still favor a component of IBS here though realize GI does not agree.   Since doing so much better rec continue diet indefinitely  and stop citrucel p 6 months to see if recurs and if so refer back to GI    Pulmonary f/u is prn

## 2018-03-12 ENCOUNTER — Telehealth: Payer: Self-pay | Admitting: Internal Medicine

## 2018-03-12 DIAGNOSIS — R109 Unspecified abdominal pain: Secondary | ICD-10-CM

## 2018-03-12 NOTE — Telephone Encounter (Signed)
Patient is having right side pain.  He believes this is due to kidney stones found on last CT taken 12/03/17.  Patient is requesting referral to urologist. I have found an opening for tomorrow with [facility].

## 2018-03-12 NOTE — Addendum Note (Signed)
Addended by: Biagio Borg on: 03/12/2018 12:52 PM   Modules accepted: Orders

## 2018-03-12 NOTE — Telephone Encounter (Signed)
Ok, this is done 

## 2018-03-13 DIAGNOSIS — N138 Other obstructive and reflux uropathy: Secondary | ICD-10-CM | POA: Diagnosis not present

## 2018-03-13 DIAGNOSIS — E7801 Familial hypercholesterolemia: Secondary | ICD-10-CM | POA: Diagnosis not present

## 2018-03-13 DIAGNOSIS — N401 Enlarged prostate with lower urinary tract symptoms: Secondary | ICD-10-CM | POA: Diagnosis not present

## 2018-03-13 DIAGNOSIS — N200 Calculus of kidney: Secondary | ICD-10-CM | POA: Diagnosis not present

## 2018-03-23 DIAGNOSIS — N200 Calculus of kidney: Secondary | ICD-10-CM | POA: Diagnosis not present

## 2018-03-27 DIAGNOSIS — N200 Calculus of kidney: Secondary | ICD-10-CM | POA: Diagnosis not present

## 2018-04-21 ENCOUNTER — Ambulatory Visit (INDEPENDENT_AMBULATORY_CARE_PROVIDER_SITE_OTHER): Payer: BLUE CROSS/BLUE SHIELD | Admitting: Pulmonary Disease

## 2018-04-21 ENCOUNTER — Encounter: Payer: Self-pay | Admitting: Pulmonary Disease

## 2018-04-21 VITALS — BP 120/80 | HR 79 | Ht 75.0 in | Wt 245.2 lb

## 2018-04-21 DIAGNOSIS — G4733 Obstructive sleep apnea (adult) (pediatric): Secondary | ICD-10-CM

## 2018-04-21 NOTE — Patient Instructions (Signed)
High Probability of obstructive sleep apnea  We will set you up with a home sleep study Treatment options as discussed  You back in the office in about 3 months  Will get a flu swab on you today   Sleep Apnea Sleep apnea is a condition in which breathing pauses or becomes shallow during sleep. Episodes of sleep apnea usually last 10 seconds or longer, and they may occur as many as 20 times an hour. Sleep apnea disrupts your sleep and keeps your body from getting the rest that it needs. This condition can increase your risk of certain health problems, including:  Heart attack.  Stroke.  Obesity.  Diabetes.  Heart failure.  Irregular heartbeat. There are three kinds of sleep apnea:  Obstructive sleep apnea. This kind is caused by a blocked or collapsed airway.  Central sleep apnea. This kind happens when the part of the brain that controls breathing does not send the correct signals to the muscles that control breathing.  Mixed sleep apnea. This is a combination of obstructive and central sleep apnea. What are the causes? The most common cause of this condition is a collapsed or blocked airway. An airway can collapse or become blocked if:  Your throat muscles are abnormally relaxed.  Your tongue and tonsils are larger than normal.  You are overweight.  Your airway is smaller than normal. What increases the risk? This condition is more likely to develop in people who:  Are overweight.  Smoke.  Have a smaller than normal airway.  Are elderly.  Are male.  Drink alcohol.  Take sedatives or tranquilizers.  Have a family history of sleep apnea. What are the signs or symptoms? Symptoms of this condition include:  Trouble staying asleep.  Daytime sleepiness and tiredness.  Irritability.  Loud snoring.  Morning headaches.  Trouble concentrating.  Forgetfulness.  Decreased interest in sex.  Unexplained sleepiness.  Mood swings.  Personality  changes.  Feelings of depression.  Waking up often during the night to urinate.  Dry mouth.  Sore throat. How is this diagnosed? This condition may be diagnosed with:  A medical history.  A physical exam.  A series of tests that are done while you are sleeping (sleep study). These tests are usually done in a sleep lab, but they may also be done at home. How is this treated? Treatment for this condition aims to restore normal breathing and to ease symptoms during sleep. It may involve managing health issues that can affect breathing, such as high blood pressure or obesity. Treatment may include:  Sleeping on your side.  Using a decongestant if you have nasal congestion.  Avoiding the use of depressants, including alcohol, sedatives, and narcotics.  Losing weight if you are overweight.  Making changes to your diet.  Quitting smoking.  Using a device to open your airway while you sleep, such as: ? An oral appliance. This is a custom-made mouthpiece that shifts your lower jaw forward. ? A continuous positive airway pressure (CPAP) device. This device delivers oxygen to your airway through a mask. ? A nasal expiratory positive airway pressure (EPAP) device. This device has valves that you put into each nostril. ? A bi-level positive airway pressure (BPAP) device. This device delivers oxygen to your airway through a mask.  Surgery if other treatments do not work. During surgery, excess tissue is removed to create a wider airway. It is important to get treatment for sleep apnea. Without treatment, this condition can lead to:  High  blood pressure.  Coronary artery disease.  (Men) An inability to achieve or maintain an erection (impotence).  Reduced thinking abilities. Follow these instructions at home:  Make any lifestyle changes that your health care provider recommends.  Eat a healthy, well-balanced diet.  Take over-the-counter and prescription medicines only as told  by your health care provider.  Avoid using depressants, including alcohol, sedatives, and narcotics.  Take steps to lose weight if you are overweight.  If you were given a device to open your airway while you sleep, use it only as told by your health care provider.  Do not use any tobacco products, such as cigarettes, chewing tobacco, and e-cigarettes. If you need help quitting, ask your health care provider.  Keep all follow-up visits as told by your health care provider. This is important. Contact a health care provider if:  The device that you received to open your airway during sleep is uncomfortable or does not seem to be working.  Your symptoms do not improve.  Your symptoms get worse. Get help right away if:  You develop chest pain.  You develop shortness of breath.  You develop discomfort in your back, arms, or stomach.  You have trouble speaking.  You have weakness on one side of your body.  You have drooping in your face. These symptoms may represent a serious problem that is an emergency. Do not wait to see if the symptoms will go away. Get medical help right away. Call your local emergency services (911 in the [place] Do not drive yourself to the hospital. This information is not intended to replace advice given to you by your health care provider. Make sure you discuss any questions you have with your health care provider. Document Released: 02/15/2002 Document Revised: 09/23/2016 Document Reviewed: 12/05/2014 Elsevier Interactive Patient Education  2019 [employer].

## 2018-04-21 NOTE — Progress Notes (Signed)
Brad Ray    [id number]    07/24/1960  Primary Care Physician:John, Hunt Oris, MD  Referring Physician: Biagio Borg, MD [address] [place], [place] [address]  Chief complaint:   History of significant snoring, witnessed apneas during recent moderate sedation  HPI:  Longstanding history of snoring Gasping respirations at night Rapid heart rate on waking up from sleep Usually tries to lay on his side because of difficulty sleeping on his back Admits to dryness of his mouth Admits to headaches in the morning  Brother probably has OSA  Usually goes to bed between 10 and 12 midnight Final wake time of 7:53 AM Would usually fall asleep easily sleep for about 3 to 4 hours and then wake up and tosses and turn for a while  Weight has been relatively stable  Outpatient Encounter Medications as of 04/21/2018  Medication Sig  . ARMOUR THYROID 90 MG tablet Take 1 tablet by mouth daily.  . diazepam (VALIUM) 5 MG tablet Take 1 tablet (5 mg total) by mouth 3 times/day as needed-between meals & bedtime.  . methylcellulose (CITRUCEL) oral powder Take by mouth 2 (two) times daily.  . Multiple Vitamin (MULTIVITAMIN) capsule Take 1 capsule by mouth daily.  . tadalafil (CIALIS) 20 MG tablet TAKE ONE TABLET BY MOUTH ONCE DAILY AS NEEDED FOR ERECTICLE DYSFUNCTION  . vitamin B-12 (CYANOCOBALAMIN) 500 MCG tablet Take 500 mcg by mouth daily.    . vitamin C (ASCORBIC ACID) 500 MG tablet Take 1,000 mg by mouth daily.    . Vitamin D, Ergocalciferol, (DRISDOL) 1.25 MG (50000 UT) CAPS capsule Take 1 capsule (50,000 Units total) by mouth every 7 (seven) days.  Marland Kitchen zolpidem (AMBIEN) 10 MG tablet TAKE 1 TABLET BY MOUTH EVERYDAY AT BEDTIME  . [DISCONTINUED] losartan (COZAAR) 100 MG tablet Take 1 tablet (100 mg total) by mouth daily.  Marland Kitchen telmisartan (MICARDIS) 80 MG tablet TAKE 1 TABLET BY MOUTH EVERY DAY (PER INS 09/14/2017)   Facility-Administered Encounter Medications as of 04/21/2018    Medication  . 0.9 %  sodium chloride infusion    Allergies as of 04/21/2018 - Review Complete 04/21/2018  Allergen Reaction Noted  . Penicillins      Past Medical History:  Diagnosis Date  . Abdominal pain, right upper quadrant 12/09/2007  . ALLERGIC RHINITIS 03/19/2007  . Allergy   . DEGENERATIVE JOINT DISEASE, LUMBAR SPINE 12/23/2006  . [place] DISEASE, CERVICAL 12/23/2006  . FATIGUE 04/28/2009  . GERD 12/23/2006  . HYPERCHOLESTEROLEMIA 12/23/2006  . HYPERLIPIDEMIA 03/19/2007  . HYPERSOMNIA 04/28/2009  . HYPERTENSION 03/19/2007  . HYPOTENSION 04/13/2009  . INSOMNIA, HX OF 12/23/2006  . LIVER MASS 12/11/2007  . MENIERE'S DISEASE 12/23/2006  . OTITIS MEDIA, LEFT 04/13/2009  . PERFORATION, TYMPANIC MEMBRANE NOS 12/23/2006    Past Surgical History:  Procedure Laterality Date  . left knee (MCL) repair    . s/p left ear surgury for vertigo    . s/p left knee medial collateral ligament damage      Family History  Problem Relation Age of Onset  . Diabetes Father   . Hypertension Father   . Heart disease Father   . Alcohol abuse Other   . Colon cancer Neg Hx   . Pancreatic cancer Neg Hx   . Stomach cancer Neg Hx   . Esophageal cancer Neg Hx   . Rectal cancer Neg Hx     Social History   Socioeconomic History  .  Marital status: Married    Spouse name: Not on file  . Number of children: 1  . Years of education: Not on file  . Highest education level: Not on file  Occupational History  . Not on file  Social Needs  . Financial resource strain: Not on file  . Food insecurity:    Worry: Not on file    Inability: Not on file  . Transportation needs:    Medical: Not on file    Non-medical: Not on file  Tobacco Use  . Smoking status: Never Smoker  . Smokeless tobacco: Never Used  Substance and Sexual Activity  . Alcohol use: Yes    Comment: weekly beer  . Drug use: No  . Sexual activity: Not on file  Lifestyle  . Physical activity:    Days per week: Not on file     Minutes per session: Not on file  . Stress: Not on file  Relationships  . Social connections:    Talks on phone: Not on file    Gets together: Not on file    Attends religious service: Not on file    Active member of club or organization: Not on file    Attends meetings of clubs or organizations: Not on file    Relationship status: Not on file  . Intimate partner violence:    Fear of current or ex partner: Not on file    Emotionally abused: Not on file    Physically abused: Not on file    Forced sexual activity: Not on file  Other Topics Concern  . Not on file  Social History Narrative   1 child at [employer]    Review of Systems  Constitutional: Negative.   HENT: Negative.   Eyes: Negative.   Respiratory: Positive for apnea and cough. Negative for shortness of breath.   Cardiovascular: Negative.   Psychiatric/Behavioral: Positive for sleep disturbance.  All other systems reviewed and are negative.   Vitals:   04/21/18 1413  BP: 120/80  Pulse: 79  SpO2: 96%     Physical Exam  Constitutional: He appears well-developed and well-nourished. No distress.  HENT:  Head: Normocephalic and atraumatic.  Mallampati 2  Eyes: Pupils are equal, round, and reactive to light. Conjunctivae are normal. Right eye exhibits no discharge. Left eye exhibits no discharge.  Neck: Normal range of motion. Neck supple. No tracheal deviation present. No thyromegaly present.  Cardiovascular: Normal rate and regular rhythm.  Pulmonary/Chest: Effort normal and breath sounds normal. No respiratory distress. He has no wheezes. He has no rales. He exhibits no tenderness.  Abdominal: Soft. Bowel sounds are normal. He exhibits no distension. There is no abdominal tenderness. There is no rebound.  Skin: He is not diaphoretic.   Assessment:  High probability of significant sleep disordered breathing -Witnessed apneas, snoring, gasping respirations at night  Obesity    Plan/Recommendations:  We  will schedule patient for home sleep study  Pathophysiology of sleep disordered breathing discussed  Treatment options for sleep disordered breathing discussed  Patient with respiratory complaints, family members with flulike symptoms -We will swab him today for influenza  We will see him back in the office in about 3 months  Sherrilyn Rist MD [facility] 04/21/2018, 2:38 PM  CC: Biagio Borg, MD

## 2018-04-22 DIAGNOSIS — H8103 Meniere's disease, bilateral: Secondary | ICD-10-CM | POA: Diagnosis not present

## 2018-04-22 DIAGNOSIS — H90A32 Mixed conductive and sensorineural hearing loss, unilateral, left ear with restricted hearing on the contralateral side: Secondary | ICD-10-CM | POA: Diagnosis not present

## 2018-04-22 DIAGNOSIS — H90A21 Sensorineural hearing loss, unilateral, right ear, with restricted hearing on the contralateral side: Secondary | ICD-10-CM | POA: Diagnosis not present

## 2018-05-12 DIAGNOSIS — G4733 Obstructive sleep apnea (adult) (pediatric): Secondary | ICD-10-CM

## 2018-05-13 DIAGNOSIS — G4733 Obstructive sleep apnea (adult) (pediatric): Secondary | ICD-10-CM | POA: Diagnosis not present

## 2018-05-19 ENCOUNTER — Telehealth: Payer: Self-pay | Admitting: Pulmonary Disease

## 2018-05-19 DIAGNOSIS — G4733 Obstructive sleep apnea (adult) (pediatric): Secondary | ICD-10-CM | POA: Diagnosis not present

## 2018-05-19 NOTE — Telephone Encounter (Signed)
Dr. Ander Slade has reviewed the home sleep test this showed Moderate OSA and Mild Oxygen desaturations AHI 42.9  Recommendations   Treatment options are CPAP with the settings auto 5 to 15cm, mask of choice and supplies.    Weight loss measures encouraged.   Advise against driving while sleepy & against medication with sedative side effects.    Please make appointment for 3 months for compliance with download with Dr. Ander Slade.

## 2018-05-19 NOTE — Telephone Encounter (Signed)
Attempted to call patient today regarding results. I did not receive an answer at time of call. I have left a voicemail message for pt to return call. X1  

## 2018-05-20 NOTE — Telephone Encounter (Signed)
Called and spoke with pt letting him know the results of the HST and recommendations per AO. Pt stated he would start CPAP. 3 month follow up scheduled for pt to see AO and order placed for CPAP start. Nothing further needed.

## 2018-05-20 NOTE — Telephone Encounter (Signed)
Patient is returning phone call.  Patient phone number is [phone].

## 2018-06-01 DIAGNOSIS — G4733 Obstructive sleep apnea (adult) (pediatric): Secondary | ICD-10-CM | POA: Diagnosis not present

## 2018-06-30 DIAGNOSIS — F5109 Other insomnia not due to a substance or known physiological condition: Secondary | ICD-10-CM | POA: Diagnosis not present

## 2018-06-30 DIAGNOSIS — R5383 Other fatigue: Secondary | ICD-10-CM | POA: Diagnosis not present

## 2018-06-30 DIAGNOSIS — E7800 Pure hypercholesterolemia, unspecified: Secondary | ICD-10-CM | POA: Diagnosis not present

## 2018-06-30 DIAGNOSIS — E559 Vitamin D deficiency, unspecified: Secondary | ICD-10-CM | POA: Diagnosis not present

## 2018-06-30 DIAGNOSIS — R635 Abnormal weight gain: Secondary | ICD-10-CM | POA: Diagnosis not present

## 2018-06-30 DIAGNOSIS — E291 Testicular hypofunction: Secondary | ICD-10-CM | POA: Diagnosis not present

## 2018-07-02 DIAGNOSIS — G4733 Obstructive sleep apnea (adult) (pediatric): Secondary | ICD-10-CM | POA: Diagnosis not present

## 2018-07-05 ENCOUNTER — Other Ambulatory Visit: Payer: Self-pay | Admitting: Internal Medicine

## 2018-07-06 NOTE — Telephone Encounter (Signed)
Done erx 

## 2018-07-20 ENCOUNTER — Ambulatory Visit: Payer: BLUE CROSS/BLUE SHIELD | Admitting: Pulmonary Disease

## 2018-07-28 ENCOUNTER — Other Ambulatory Visit: Payer: Self-pay | Admitting: Internal Medicine

## 2018-08-01 DIAGNOSIS — G4733 Obstructive sleep apnea (adult) (pediatric): Secondary | ICD-10-CM | POA: Diagnosis not present

## 2018-08-05 NOTE — Telephone Encounter (Signed)
error 

## 2018-08-27 ENCOUNTER — Encounter: Payer: Self-pay | Admitting: Pulmonary Disease

## 2018-08-31 ENCOUNTER — Ambulatory Visit: Payer: BLUE CROSS/BLUE SHIELD | Admitting: Pulmonary Disease

## 2018-08-31 ENCOUNTER — Other Ambulatory Visit: Payer: Self-pay

## 2018-08-31 ENCOUNTER — Ambulatory Visit (INDEPENDENT_AMBULATORY_CARE_PROVIDER_SITE_OTHER): Payer: BC Managed Care – PPO | Admitting: Pulmonary Disease

## 2018-08-31 ENCOUNTER — Encounter: Payer: Self-pay | Admitting: Pulmonary Disease

## 2018-08-31 DIAGNOSIS — G4733 Obstructive sleep apnea (adult) (pediatric): Secondary | ICD-10-CM | POA: Diagnosis not present

## 2018-08-31 NOTE — Patient Instructions (Addendum)
Mask of choice order to DME [employer]   Compliance check in 2 months   We recommend that you continue using your CPAP daily >>>Keep up the hard work using your device >>> Goal should be wearing this for the entire night that you are sleeping, at least 4 to 6 hours  Remember:  . Do not drive or operate heavy machinery if tired or drowsy.  . Please notify the supply company and office if you are unable to use your device regularly due to missing supplies or machine being broken.  . Work on maintaining a healthy weight and following your recommended nutrition plan  . Maintain proper daily exercise and movement  . Maintaining proper use of your device can also help improve management of other chronic illnesses such as: Blood pressure, blood sugars, and weight management.   BiPAP/ CPAP Cleaning:  >>>Clean weekly, with Dawn soap, and bottle brush.  Set up to air dry.   Return in about 3 months (around 12/01/2018), or if symptoms worsen or fail to improve, for Follow up with Wyn Quaker FNP-C, Follow up with Dr. Ander Slade.   Coronavirus (COVID-19) Are you at risk?  Are you at risk for the Coronavirus (COVID-19)?  To be considered HIGH RISK for Coronavirus (COVID-19), you have to meet the following criteria:  . Traveled to [place], [place], [place], [place] or [place]; or in the [place] to [place], [place], [place], or [place]; and have fever, cough, and shortness of breath within the last 2 weeks of travel OR . Been in close contact with a person diagnosed with COVID-19 within the last 2 weeks and have fever, cough, and shortness of breath . IF YOU DO NOT MEET THESE CRITERIA, YOU ARE CONSIDERED LOW RISK FOR COVID-19.  What to do if you are HIGH RISK for COVID-19?  Marland Kitchen If you are having a medical emergency, call 911. . Seek medical care right away. Before you go to a doctor's office, urgent care or emergency department, call ahead and tell them about your recent travel, contact with  someone diagnosed with COVID-19, and your symptoms. You should receive instructions from your physician's office regarding next steps of care.  . When you arrive at healthcare provider, tell the healthcare staff immediately you have returned from visiting [place], [place], [place], [place] or [place]; or traveled in the [place] to [place], [place], [place], or [place]; in the last two weeks or you have been in close contact with a person diagnosed with COVID-19 in the last 2 weeks.   . Tell the health care staff about your symptoms: fever, cough and shortness of breath. . After you have been seen by a medical provider, you will be either: o Tested for (COVID-19) and discharged home on quarantine except to seek medical care if symptoms worsen, and asked to  - Stay home and avoid contact with others until you get your results (4-5 days)  - Avoid travel on public transportation if possible (such as bus, train, or airplane) or o Sent to the Emergency Department by EMS for evaluation, COVID-19 testing, and possible admission depending on your condition and test results.  What to do if you are LOW RISK for COVID-19?  Reduce your risk of any infection by using the same precautions used for avoiding the common cold or flu:  Marland Kitchen Wash your hands often with soap and warm water for at least 20 seconds.  If soap and water are not readily available,  use an alcohol-based hand sanitizer with at least 60% alcohol.  . If coughing or sneezing, cover your mouth and nose by coughing or sneezing into the elbow areas of your shirt or coat, into a tissue or into your sleeve (not your hands). . Avoid shaking hands with others and consider head nods or verbal greetings only. . Avoid touching your eyes, nose, or mouth with unwashed hands.  . Avoid close contact with people who are sick. . Avoid places or events with large numbers of people in one location, like concerts or sporting events. . Carefully consider  travel plans you have or are making. . If you are planning any travel outside or inside the [place], visit the CDC's Travelers' Health webpage for the latest health notices. . If you have some symptoms but not all symptoms, continue to monitor at home and seek medical attention if your symptoms worsen. . If you are having a medical emergency, call 911.   [place] / e-Visit: [email or web address]         [facility]: [facility]: [phone]                   [facility]: [phone]           It is flu season:   >>> Best ways to protect herself from the flu: Receive the yearly flu vaccine, practice good hand hygiene washing with soap and also using hand sanitizer when available, eat a nutritious meals, get adequate rest, hydrate appropriately   Please contact the office if your symptoms worsen or you have concerns that you are not improving.   Thank you for choosing [facility] for your healthcare, and for allowing [place] to partner with you on your healthcare journey. I am thankful to be able to provide care to you today.   Wyn Quaker FNP-C     Living With Sleep Apnea Sleep apnea is a condition in which breathing pauses or becomes shallow during sleep. Sleep apnea is most commonly caused by a collapsed or blocked airway. People with sleep apnea snore loudly and have times when they gasp and stop breathing for 10 seconds or more during sleep. This happens over and over during the night. This disrupts your sleep and keeps your body from getting the rest that it needs, which can cause tiredness and lack of energy (fatigue) during the day. The breaks in breathing also interrupt the deep sleep that you need to feel rested. Even if you do not completely wake up from the gaps in breathing, your sleep may not be restful. You may  also have a headache in the morning and low energy during the day, and you may feel anxious or depressed. How can sleep apnea affect me? Sleep apnea increases your chances of extreme tiredness during the day (daytime fatigue). It can also increase your risk for health conditions, such as:  Heart attack.  Stroke.  Diabetes.  Heart failure.  Irregular heartbeat.  High blood pressure. If you have daytime fatigue as a result of sleep apnea, you may be more likely to:  Perform poorly at school or work.  Fall asleep while driving.  Have difficulty with attention.  Develop depression or anxiety.  Become severely overweight (obese).  Have sexual dysfunction. What actions can I take to manage sleep apnea? Sleep apnea treatment   If you were given a device to open your airway  while you sleep, use it only as told by your health care provider. You may be given: ? An oral appliance. This is a custom-made mouthpiece that shifts your lower jaw forward. ? A continuous positive airway pressure (CPAP) device. This device blows air through a mask when you breathe out (exhale). ? A nasal expiratory positive airway pressure (EPAP) device. This device has valves that you put into each nostril. ? A bi-level positive airway pressure (BPAP) device. This device blows air through a mask when you breathe in (inhale) and breathe out (exhale).  You may need surgery if other treatments do not work for you. Sleep habits  Go to sleep and wake up at the same time every day. This helps set your internal clock (circadian rhythm) for sleeping. ? If you stay up later than usual, such as on weekends, try to get up in the morning within 2 hours of your normal wake time.  Try to get at least 7-9 hours of sleep each night.  Stop computer, tablet, and mobile phone use a few hours before bedtime.  Do not take long naps during the day. If you nap, limit it to 30 minutes.  Have a relaxing bedtime routine.  Reading or listening to music may relax you and help you sleep.  Use your bedroom only for sleep. ? Keep your television and computer out of your bedroom. ? Keep your bedroom cool, dark, and quiet. ? Use a supportive mattress and pillows.  Follow your health care provider's instructions for other changes to sleep habits. Nutrition  Do not eat heavy meals in the evening.  Do not have caffeine in the later part of the day. The effects of caffeine can last for more than 5 hours.  Follow your health care provider's or dietitian's instructions for any diet changes. Lifestyle      Do not drink alcohol before bedtime. Alcohol can cause you to fall asleep at first, but then it can cause you to wake up in the middle of the night and have trouble getting back to sleep.  Do not use any products that contain nicotine or tobacco, such as cigarettes and e-cigarettes. If you need help quitting, ask your health care provider. Medicines  Take over-the-counter and prescription medicines only as told by your health care provider.  Do not use over-the-counter sleep medicine. You can become dependent on this medicine, and it can make sleep apnea worse.  Do not use medicines, such as sedatives and narcotics, unless told by your health care provider. Activity  Exercise on most days, but avoid exercising in the evening. Exercising near bedtime can interfere with sleeping.  If possible, spend time outside every day. Natural light helps regulate your circadian rhythm. General information  Lose weight if you need to, and maintain a healthy weight.  Keep all follow-up visits as told by your health care provider. This is important.  If you are having surgery, make sure to tell your health care provider that you have sleep apnea. You may need to bring your device with you. Where to find more information Learn more about sleep apnea and daytime fatigue from:  American Sleep Association:  sleepassociation.[place]: sleepfoundation.org  National Heart, Lung, and Blood Institute: https://www.hartman-hill.biz/ Summary  Sleep apnea can cause daytime fatigue and other serious health conditions.  Both sleep apnea and daytime fatigue can be bad for your health and well-being.  You may need to wear a device while sleeping to help keep your  airway open.  If you are having surgery, make sure to tell your health care provider that you have sleep apnea. You may need to bring your device with you.  Making changes to sleep habits, diet, lifestyle, and activity can help you manage sleep apnea. This information is not intended to replace advice given to you by your health care provider. Make sure you discuss any questions you have with your health care provider. Document Released: 05/22/2017 Document Revised: 10/28/2017 Document Reviewed: 05/22/2017 Elsevier Interactive Patient Education  2019 [place].     CPAP and BPAP Information CPAP and BPAP are methods of helping a person breathe with the use of air pressure. CPAP stands for "continuous positive airway pressure." BPAP stands for "bi-level positive airway pressure." In both methods, air is blown through your nose or mouth and into your air passages to help you breathe well. CPAP and BPAP use different amounts of pressure to blow air. With CPAP, the amount of pressure stays the same while you breathe in and out. With BPAP, the amount of pressure is increased when you breathe in (inhale) so that you can take larger breaths. Your health care provider will recommend whether CPAP or BPAP would be more helpful for you. Why are CPAP and BPAP treatments used? CPAP or BPAP can be helpful if you have:  Sleep apnea.  Chronic obstructive pulmonary disease (COPD).  Heart failure.  Medical conditions that weaken the muscles of the chest including muscular dystrophy, or neurological diseases such as amyotrophic lateral sclerosis  (ALS).  Other problems that cause breathing to be weak, abnormal, or difficult. CPAP is most commonly used for obstructive sleep apnea (OSA) to keep the airways from collapsing when the muscles relax during sleep. How is CPAP or BPAP administered? Both CPAP and BPAP are provided by a small machine with a flexible plastic tube that attaches to a plastic mask. You wear the mask. Air is blown through the mask into your nose or mouth. The amount of pressure that is used to blow the air can be adjusted on the machine. Your health care provider will determine the pressure setting that should be used based on your individual needs. When should CPAP or BPAP be used? In most cases, the mask only needs to be worn during sleep. Generally, the mask needs to be worn throughout the night and during any daytime naps. People with certain medical conditions may also need to wear the mask at other times when they are awake. Follow instructions from your health care provider about when to use the machine. What are some tips for using the mask?   Because the mask needs to be snug, some people feel trapped or closed-in (claustrophobic) when first using the mask. If you feel this way, you may need to get used to the mask. One way to do this is by holding the mask loosely over your nose or mouth and then gradually applying the mask more snugly. You can also gradually increase the amount of time that you use the mask.  Masks are available in various types and sizes. Some fit over your mouth and nose while others fit over just your nose. If your mask does not fit well, talk with your health care provider about getting a different one.  If you are using a mask that fits over your nose and you tend to breathe through your mouth, a chin strap may be applied to help keep your mouth closed.  The CPAP and BPAP  machines have alarms that may sound if the mask comes off or develops a leak.  If you have trouble with the mask, it is  very important that you talk with your health care provider about finding a way to make the mask easier to tolerate. Do not stop using the mask. Stopping the use of the mask could have a negative impact on your health. What are some tips for using the machine?  Place your CPAP or BPAP machine on a secure table or stand near an electrical outlet.  Know where the on/off switch is located on the machine.  Follow instructions from your health care provider about how to set the pressure on your machine and when you should use it.  Do not eat or drink while the CPAP or BPAP machine is on. Food or fluids could get pushed into your lungs by the pressure of the CPAP or BPAP.  Do not smoke. Tobacco smoke residue can damage the machine.  For home use, CPAP and BPAP machines can be rented or purchased through home health care companies. Many different brands of machines are available. Renting a machine before purchasing may help you find out which particular machine works well for you.  Keep the CPAP or BPAP machine and attachments clean. Ask your health care provider for specific instructions. Get help right away if:  You have redness or open areas around your nose or mouth where the mask fits.  You have trouble using the CPAP or BPAP machine.  You cannot tolerate wearing the CPAP or BPAP mask.  You have pain, discomfort, and bloating in your abdomen. Summary  CPAP and BPAP are methods of helping a person breathe with the use of air pressure.  Both CPAP and BPAP are provided by a small machine with a flexible plastic tube that attaches to a plastic mask.  If you have trouble with the mask, it is very important that you talk with your health care provider about finding a way to make the mask easier to tolerate. This information is not intended to replace advice given to you by your health care provider. Make sure you discuss any questions you have with your health care provider. Document  Released: 11/24/2003 Document Revised: 10/28/2017 Document Reviewed: 01/15/2016 Elsevier Interactive Patient Education  2019 [employer].

## 2018-08-31 NOTE — Assessment & Plan Note (Signed)
Assessment: March/2020 home sleep study shows severe obstructive sleep apnea with an AHI of 42.9-hour February/2020 Epworth score 12, 08/31/2018 Epworth score today 5 Last weight in chart 245 pounds Patient reporting improved sleep as well as improved daytime sleepiness when using CPAP Patient is working on his compliance with CPAP therapy and adjusting getting used to using CPAP daily  Plan: Mask of choice order to DME company today to help with leaks seen on compliance report 8-month compliance report to check on patient's settings as well as compliance 65-month follow-up at this point in time to further assess and work with patient on CPAP therapy Continue to wear CPAP nightly Do not take CPAP off in the middle the night Wear CPAP when you are napping Continue to work on diet and exercise Do not drive when sleepy

## 2018-08-31 NOTE — Progress Notes (Signed)
Virtual Visit via Telephone Note  I connected with Brad Ray on 08/31/18 at 10:30 AM EDT by telephone and verified that I am speaking with the correct person using two identifiers.  Location: Patient: Home Provider: [facility] - [address], [place], [place] [address]   I discussed the limitations, risks, security and privacy concerns of performing an evaluation and management service by telephone and the availability of in person appointments. I also discussed with the patient that there may be a patient responsible charge related to this service. The patient expressed understanding and agreed to proceed.  Patient consented to consult via telephone: Yes People present and their role in pt care: Pt   History of Present Illness: 58 year old male followed in our office for severe obstructive sleep apnea Patient of Dr. Ander Slade  Chief complaint: CPAP follow-up   58 year old male patient followed in our office for severe obstructive sleep apnea.  March/2020 home sleep study shows severe obstructive sleep apnea with an AHI of 42.9 an hour.  Patient was started on CPAP following these results.  CPAP compliance report today shows adequate compliance with room for improvement.  See results listed below:  07/29/2018-08/27/2018-CPAP compliance report-23 had a last 30 days used, 14 of those days greater than 4 hours, average usage 4 hours and 10 minutes, APAP settings 5-15, 95th percentile 6.6, often leaks  Improvement on epworth score today. Epworth score of 5 today. Pt reports that he starts the night with a great seal on the full face mask.  Overall the patient reports that his sleep has improved and clinically he feels improved since starting CPAP therapy he still is adjusting though to be comfortable using it.  At his baseline the patient tosses and turns a lot in his sleep which wearing a CPAP has made that more difficult.  He is interested in trying another  mask.  Observations/Objective:  05/13/2018-Home sleep study- AHI 42.9-hour  Results of the Epworth flowsheet 08/31/2018 04/21/2018  Sitting and reading 1 2  Watching TV 1 2  Sitting, inactive in a public place (e.g. a theatre or a meeting) 0 2  As a passenger in a car for an hour without a break 1 2  Lying down to rest in the afternoon when circumstances permit 2 3  Sitting and talking to someone 0 0  Sitting quietly after a lunch without alcohol 0 1  In a car, while stopped for a few minutes in traffic 0 0  Total score 5 12     Assessment and Plan:  Severe obstructive sleep apnea Assessment: March/2020 home sleep study shows severe obstructive sleep apnea with an AHI of 42.9-hour February/2020 Epworth score 12, 08/31/2018 Epworth score today 5 Last weight in chart 245 pounds Patient reporting improved sleep as well as improved daytime sleepiness when using CPAP Patient is working on his compliance with CPAP therapy and adjusting getting used to using CPAP daily  Plan: Mask of choice order to DME company today to help with leaks seen on compliance report 21-month compliance report to check on patient's settings as well as compliance 95-month follow-up at this point in time to further assess and work with patient on CPAP therapy Continue to wear CPAP nightly Do not take CPAP off in the middle the night Wear CPAP when you are napping Continue to work on diet and exercise Do not drive when sleepy    Follow Up Instructions:  Return in about 3 months (around 12/01/2018), or  if symptoms worsen or fail to improve, for Follow up with Wyn Quaker FNP-C, Follow up with Dr. Ander Slade.   I discussed the assessment and treatment plan with the patient. The patient was provided an opportunity to ask questions and all were answered. The patient agreed with the plan and demonstrated an understanding of the instructions.   The patient was advised to call back or seek an in-person evaluation if the  symptoms worsen or if the condition fails to improve as anticipated.  I provided 23 minutes of non-face-to-face time during this encounter.   Lauraine Rinne, NP

## 2018-09-01 DIAGNOSIS — G4733 Obstructive sleep apnea (adult) (pediatric): Secondary | ICD-10-CM | POA: Diagnosis not present

## 2019-01-06 ENCOUNTER — Other Ambulatory Visit: Payer: Self-pay | Admitting: Internal Medicine

## 2019-01-11 ENCOUNTER — Other Ambulatory Visit: Payer: Self-pay | Admitting: Internal Medicine

## 2019-01-12 NOTE — Telephone Encounter (Signed)
Done erx for 90 days  Please ask pt to make ROV further refills after this

## 2019-01-13 NOTE — Telephone Encounter (Signed)
Called pt no answer LMOM w/MD response../lmb 

## 2019-02-02 DIAGNOSIS — M47812 Spondylosis without myelopathy or radiculopathy, cervical region: Secondary | ICD-10-CM | POA: Diagnosis not present

## 2019-02-03 DIAGNOSIS — Z6829 Body mass index (BMI) 29.0-29.9, adult: Secondary | ICD-10-CM | POA: Insufficient documentation

## 2019-02-03 DIAGNOSIS — M9981 Other biomechanical lesions of cervical region: Secondary | ICD-10-CM | POA: Diagnosis not present

## 2019-02-25 DIAGNOSIS — M47812 Spondylosis without myelopathy or radiculopathy, cervical region: Secondary | ICD-10-CM | POA: Diagnosis not present

## 2019-03-09 DIAGNOSIS — M542 Cervicalgia: Secondary | ICD-10-CM | POA: Diagnosis not present

## 2019-03-09 DIAGNOSIS — M47812 Spondylosis without myelopathy or radiculopathy, cervical region: Secondary | ICD-10-CM | POA: Diagnosis not present

## 2019-03-16 DIAGNOSIS — M47812 Spondylosis without myelopathy or radiculopathy, cervical region: Secondary | ICD-10-CM | POA: Diagnosis not present

## 2019-03-18 ENCOUNTER — Telehealth: Payer: Self-pay | Admitting: *Deleted

## 2019-03-18 ENCOUNTER — Other Ambulatory Visit (INDEPENDENT_AMBULATORY_CARE_PROVIDER_SITE_OTHER): Payer: BC Managed Care – PPO

## 2019-03-18 DIAGNOSIS — E559 Vitamin D deficiency, unspecified: Secondary | ICD-10-CM

## 2019-03-18 DIAGNOSIS — Z125 Encounter for screening for malignant neoplasm of prostate: Secondary | ICD-10-CM | POA: Diagnosis not present

## 2019-03-18 DIAGNOSIS — E291 Testicular hypofunction: Secondary | ICD-10-CM | POA: Diagnosis not present

## 2019-03-18 DIAGNOSIS — Z0000 Encounter for general adult medical examination without abnormal findings: Secondary | ICD-10-CM

## 2019-03-18 LAB — BASIC METABOLIC PANEL
BUN: 14 mg/dL (ref 6–23)
CO2: 28 mEq/L (ref 19–32)
Calcium: 9.4 mg/dL (ref 8.4–10.5)
Chloride: 102 mEq/L (ref 96–112)
Creatinine, Ser: 1.05 mg/dL (ref 0.40–1.50)
GFR: 72.92 mL/min (ref >60.00)
Glucose, Bld: 96 mg/dL (ref 70–99)
Potassium: 4.5 mEq/L (ref 3.5–5.1)
Sodium: 137 mEq/L (ref 135–145)

## 2019-03-18 LAB — CBC WITH DIFFERENTIAL/PLATELET
Basophils Absolute: 0 10*3/uL (ref 0.0–0.1)
Basophils Relative: 0.4 % (ref 0.0–3.0)
Eosinophils Absolute: 0.1 10*3/uL (ref 0.0–0.7)
Eosinophils Relative: 2.6 % (ref 0.0–5.0)
HCT: 48 % (ref 39.0–52.0)
Hemoglobin: 16 g/dL (ref 13.0–17.0)
Lymphocytes Relative: 35.4 % (ref 12.0–46.0)
Lymphs Abs: 1.9 10*3/uL (ref 0.7–4.0)
MCHC: 33.4 g/dL (ref 30.0–36.0)
MCV: 92.5 fl (ref 78.0–100.0)
Monocytes Absolute: 0.5 10*3/uL (ref 0.1–1.0)
Monocytes Relative: 8.8 % (ref 3.0–12.0)
Neutro Abs: 2.8 10*3/uL (ref 1.4–7.7)
Neutrophils Relative %: 52.8 % (ref 43.0–77.0)
Platelets: 250 10*3/uL (ref 150.0–400.0)
RBC: 5.19 Mil/uL (ref 4.22–5.81)
RDW: 13.7 % (ref 11.5–15.5)
WBC: 5.4 10*3/uL (ref 4.0–10.5)

## 2019-03-18 LAB — HEPATIC FUNCTION PANEL
ALT: 19 U/L (ref 0–53)
AST: 17 U/L (ref 0–37)
Albumin: 4.1 g/dL (ref 3.5–5.2)
Alkaline Phosphatase: 59 U/L (ref 39–117)
Bilirubin, Direct: 0.1 mg/dL (ref 0.0–0.3)
Total Bilirubin: 0.6 mg/dL (ref 0.2–1.2)
Total Protein: 7.1 g/dL (ref 6.0–8.3)

## 2019-03-18 LAB — URINALYSIS, ROUTINE W REFLEX MICROSCOPIC
Bilirubin Urine: NEGATIVE
Hgb urine dipstick: NEGATIVE
Ketones, ur: NEGATIVE
Leukocytes,Ua: NEGATIVE
Nitrite: NEGATIVE
RBC / HPF: NONE SEEN (ref 0–?)
Specific Gravity, Urine: 1.02 (ref 1.000–1.030)
Total Protein, Urine: NEGATIVE
Urine Glucose: NEGATIVE
Urobilinogen, UA: 0.2 (ref 0.0–1.0)
WBC, UA: NONE SEEN (ref 0–?)
pH: 7 (ref 5.0–8.0)

## 2019-03-18 LAB — VITAMIN D 25 HYDROXY (VIT D DEFICIENCY, FRACTURES): VITD: 28.86 ng/mL — ABNORMAL LOW (ref 30.00–100.00)

## 2019-03-18 LAB — LIPID PANEL
Cholesterol: 227 mg/dL — ABNORMAL HIGH (ref 0–200)
HDL: 53.7 mg/dL (ref >39.00)
NonHDL: 173.78
Total CHOL/HDL Ratio: 4
Triglycerides: 203 mg/dL — ABNORMAL HIGH (ref 0.0–149.0)
VLDL: 40.6 mg/dL — ABNORMAL HIGH (ref 0.0–40.0)

## 2019-03-18 LAB — TSH: TSH: 1.75 u[IU]/mL (ref 0.35–4.50)

## 2019-03-18 LAB — PSA: PSA: 1.65 ng/mL (ref 0.10–4.00)

## 2019-03-18 LAB — LDL CHOLESTEROL, DIRECT: Direct LDL: 131 mg/dL

## 2019-03-18 NOTE — Telephone Encounter (Signed)
[facility] for labs I have orderd  I decline to do the other specialty lab requests as I am not a specialist, and this would be highly irregular, as we do not do testing on request at our private lab for specialists

## 2019-03-18 NOTE — Addendum Note (Signed)
Addended by: Isaiah Serge D on: 03/18/2019 11:51 AM   Modules accepted: Orders

## 2019-03-18 NOTE — Telephone Encounter (Signed)
Encarnacion Chu @ [facility] lab informed of MD's advisement below.

## 2019-03-18 NOTE — Addendum Note (Signed)
Addended by: Cresenciano Lick on: 03/18/2019 01:16 PM   Modules accepted: Orders

## 2019-03-18 NOTE — Telephone Encounter (Signed)
Copied from [place] [id number]. Topic: General - Other >> Mar 15, 2019 10:16 AM Sheran Luz wrote: Patient would like to know if he is able to come in for CPE labs before scheduled appointment on 1.12.2021. >> Mar 15, 2019 10:25 AM Para Skeans A wrote: Patient has no orders in, Please advise if he just needs to wait until appointment day.

## 2019-03-18 NOTE — Telephone Encounter (Signed)
Patient walked in at [facility] lab for labs ordered by a outside MD.  Cbc, cmp, lipid, t3, t4,  Free testosterone, ifg-1 psa DHT  Cortisol, estradiol,fasting insulin crp vit d, shbg, tsh  Dx codes provided: r53.83 e29.1 f51.0, e55.9 e78.0 r63.5 d51.9 r35.8

## 2019-03-19 ENCOUNTER — Other Ambulatory Visit: Payer: Self-pay | Admitting: Internal Medicine

## 2019-03-19 NOTE — Telephone Encounter (Signed)
Please refill as per office routine med refill policy (all routine meds refilled for 3 mo or monthly per pt preference up to one year from last visit, then month to month grace period for 3 mo, then further med refills will have to be denied)  

## 2019-03-20 LAB — TESTOSTERONE, FREE, TOTAL, SHBG
Sex Hormone Binding: 47.1 nmol/L (ref 19.3–76.4)
Testosterone, Free: 14 pg/mL (ref 7.2–24.0)
Testosterone: 566 ng/dL (ref 264–916)

## 2019-03-23 ENCOUNTER — Encounter: Payer: Self-pay | Admitting: Internal Medicine

## 2019-03-23 ENCOUNTER — Other Ambulatory Visit: Payer: Self-pay

## 2019-03-23 ENCOUNTER — Ambulatory Visit (INDEPENDENT_AMBULATORY_CARE_PROVIDER_SITE_OTHER): Payer: BC Managed Care – PPO | Admitting: Internal Medicine

## 2019-03-23 VITALS — BP 122/88 | HR 86 | Temp 98.1°F | Resp 14 | Ht 75.0 in | Wt 240.0 lb

## 2019-03-23 DIAGNOSIS — E785 Hyperlipidemia, unspecified: Secondary | ICD-10-CM | POA: Diagnosis not present

## 2019-03-23 DIAGNOSIS — R079 Chest pain, unspecified: Secondary | ICD-10-CM

## 2019-03-23 DIAGNOSIS — Z0000 Encounter for general adult medical examination without abnormal findings: Secondary | ICD-10-CM

## 2019-03-23 DIAGNOSIS — M47812 Spondylosis without myelopathy or radiculopathy, cervical region: Secondary | ICD-10-CM | POA: Diagnosis not present

## 2019-03-23 DIAGNOSIS — M9981 Other biomechanical lesions of cervical region: Secondary | ICD-10-CM | POA: Diagnosis not present

## 2019-03-23 DIAGNOSIS — Z6825 Body mass index (BMI) 25.0-25.9, adult: Secondary | ICD-10-CM | POA: Diagnosis not present

## 2019-03-23 NOTE — Progress Notes (Signed)
Subjective:    Patient ID: Brad Ray, male    DOB: 07/18/1960, 59 y.o.   MRN: [id number]  HPI Here for wellness and f/u;  Overall doing ok;     Pt denies neurological change such as new headache, facial or extremity weakness.  Pt denies polydipsia, polyuria, or low sugar symptoms. Pt states overall good compliance with treatment and medications, good tolerability, and has been trying to follow appropriate diet.  Pt denies worsening depressive symptoms, suicidal ideation or panic. No fever, night sweats, wt loss, loss of appetite, or other constitutional symptoms.  Pt states good ability with ADL's, has low fall risk, home safety reviewed and adequate, no other significant changes in hearing or vision, and only occasionally active with exercise. Wt Readings from Last 3 Encounters:  03/23/19 240 lb (108.9 kg)  04/21/18 245 lb 3.2 oz (111.2 kg)  01/20/18 243 lb (110.2 kg)   BP Readings from Last 3 Encounters:  03/23/19 122/88  04/21/18 120/80  01/20/18 130/86  Also, Pt denies  increased sob or doe, wheezing, orthopnea, PND, increased LE swelling, palpitations, dizziness or syncope, except has had neck pain with referred pain to shoulder and left upper chest, MRI per dr jones/NS and Dr Brien Few with c spine DJD/bone spur.  Plan is for esi, meds, and possibly surgury if not helpful, no PT for now. Also now on metformin per a wellness MD in [place], has lost some wt down to 230 at one point.  Good compliance with CPAP, but not taking his vit d supplement every day, but plans to start.  Past Medical History:  Diagnosis Date  . Abdominal pain, right upper quadrant 12/09/2007  . ALLERGIC RHINITIS 03/19/2007  . Allergy   . DEGENERATIVE JOINT DISEASE, LUMBAR SPINE 12/23/2006  . [place] DISEASE, CERVICAL 12/23/2006  . FATIGUE 04/28/2009  . GERD 12/23/2006  . HYPERCHOLESTEROLEMIA 12/23/2006  . HYPERLIPIDEMIA 03/19/2007  . HYPERSOMNIA 04/28/2009  . HYPERTENSION 03/19/2007  . HYPOTENSION 04/13/2009  .  INSOMNIA, HX OF 12/23/2006  . LIVER MASS 12/11/2007  . MENIERE'S DISEASE 12/23/2006  . OTITIS MEDIA, LEFT 04/13/2009  . PERFORATION, TYMPANIC MEMBRANE NOS 12/23/2006   Past Surgical History:  Procedure Laterality Date  . left knee (MCL) repair    . s/p left ear surgury for vertigo    . s/p left knee medial collateral ligament damage      reports that he has never smoked. He has never used smokeless tobacco. He reports current alcohol use. He reports that he does not use drugs. family history includes Alcohol abuse in an other family member; Diabetes in his father; Heart disease in his father; Hypertension in his father. Allergies  Allergen Reactions  . Penicillins     Childhood reaction   Current Outpatient Medications on File Prior to Visit  Medication Sig Dispense Refill  . ARMOUR THYROID 90 MG tablet Take 1 tablet by mouth daily.  2  . diazepam (VALIUM) 5 MG tablet Take 1 tablet (5 mg total) by mouth 3 times/day as needed-between meals & bedtime. 30 tablet 0  . methylcellulose (CITRUCEL) oral powder Take by mouth 2 (two) times daily.    . Multiple Vitamin (MULTIVITAMIN) capsule Take 1 capsule by mouth daily.    . tadalafil (CIALIS) 20 MG tablet TAKE 1 TABLET BY MOUTH ONCE DAILY AS NEEDED FOR ED 30 tablet 2  . telmisartan (MICARDIS) 80 MG tablet TAKE 1 TABLET BY MOUTH EVERY DAY 90 tablet 0  . vitamin B-12 (CYANOCOBALAMIN) 500 MCG tablet  Take 500 mcg by mouth daily.      . vitamin C (ASCORBIC ACID) 500 MG tablet Take 1,000 mg by mouth daily.      . Vitamin D, Ergocalciferol, (DRISDOL) 1.25 MG (50000 UT) CAPS capsule Take 1 capsule (50,000 Units total) by mouth every 7 (seven) days. 12 capsule 0  . zolpidem (AMBIEN) 10 MG tablet TAKE 1 TABLET BY MOUTH EVERYDAY AT BEDTIME 90 tablet 0   Current Facility-Administered Medications on File Prior to Visit  Medication Dose Route Frequency Provider Last Rate Last Admin  . 0.9 %  sodium chloride infusion  500 mL Intravenous Once Doran Stabler, MD       Review of Systems Constitutional: Negative for other unusual diaphoresis, sweats, appetite or weight changes HENT: Negative for other worsening hearing loss, ear pain, facial swelling, mouth sores or neck stiffness.   Eyes: Negative for other worsening pain, redness or other visual disturbance.  Respiratory: Negative for other stridor or swelling Cardiovascular: Negative for other palpitations or other chest pain  Gastrointestinal: Negative for worsening diarrhea or loose stools, blood in stool, distention or other pain Genitourinary: Negative for hematuria, flank pain or other change in urine volume.  Musculoskeletal: Negative for myalgias or other joint swelling.  Skin: Negative for other color change, or other wound or worsening drainage.  Neurological: Negative for other syncope or numbness. Hematological: Negative for other adenopathy or swelling Psychiatric/Behavioral: Negative for hallucinations, other worsening agitation, SI, self-injury, or new decreased concentration All otherwise neg per pt     Objective:   Physical Exam BP 122/88   Pulse 86   Temp 98.1 F (36.7 C)   Resp 14   Ht 6\' 3"  (1.905 m)   Wt 240 lb (108.9 kg)   SpO2 98%   BMI 30.00 kg/m  VS noted,  Constitutional: Pt is oriented to person, place, and time. Appears well-developed and well-nourished, in no significant distress and comfortable Head: Normocephalic and atraumatic  Eyes: Conjunctivae and EOM are normal. Pupils are equal, round, and reactive to light Right Ear: External ear normal without discharge Left Ear: External ear normal without discharge Nose: Nose without discharge or deformity Mouth/Throat: Oropharynx is without other ulcerations and moist  Neck: Normal range of motion. Neck supple. No JVD present. No tracheal deviation present or significant neck LA or mass Cardiovascular: Normal rate, regular rhythm, normal heart sounds and intact distal pulses.   Pulmonary/Chest: WOB  normal and breath sounds without rales or wheezing  Abdominal: Soft. Bowel sounds are normal. NT. No HSM  Musculoskeletal: Normal range of motion. Exhibits no edema Lymphadenopathy: Has no other cervical adenopathy.  Neurological: Pt is alert and oriented to person, place, and time. Pt has normal reflexes. No cranial nerve deficit. Motor grossly intact, Gait intact Skin: Skin is warm and dry. No rash noted or new ulcerations Psychiatric:  Has normal mood and affect. Behavior is normal without agitation All otherwise neg per pt Lab Results  Component Value Date   WBC 5.4 03/18/2019   HGB 16.0 03/18/2019   HCT 48.0 03/18/2019   PLT 250.0 03/18/2019   GLUCOSE 96 03/18/2019   CHOL 227 (H) 03/18/2019   TRIG 203.0 (H) 03/18/2019   HDL 53.70 03/18/2019   LDLDIRECT 131.0 03/18/2019   LDLCALC 155 (H) 12/11/2016   ALT 19 03/18/2019   AST 17 03/18/2019   NA 137 03/18/2019   K 4.5 03/18/2019   CL 102 03/18/2019   CREATININE 1.05 03/18/2019   BUN 14 03/18/2019  CO2 28 03/18/2019   TSH 1.75 03/18/2019   PSA 1.65 03/18/2019   HGBA1C 5.3 08/03/2015   MICROALBUR 1.3 08/03/2015          Assessment & Plan:

## 2019-03-23 NOTE — Assessment & Plan Note (Addendum)
Atypical, ecg reviewed, very unlikely cardiac, for ct cardiac repeat, consdier statin and/or stress testing if worse over may 2019

## 2019-03-23 NOTE — Assessment & Plan Note (Signed)

## 2019-03-23 NOTE — Patient Instructions (Addendum)
We have discussed the Cardiac CT Score test to measure the calcification level (if any) in your heart arteries.  This test has been ordered in our [facility] [place], so please call [facility] directly, as they prefer this, at [phone] to be scheduled.  Please continue all other medications as before, and refills have been done if requested.  Please have the pharmacy call with any other refills you may need.  Please continue your efforts at being more active, low cholesterol diet, and weight control.  You are otherwise up to date with prevention measures today.  Please keep your appointments with your specialists as you may have planned  Please return in 1 year for your yearly visit, or sooner if needed, with Lab testing done 3-5 days before at the [facility] - please make appt as you leave today for this

## 2019-03-25 NOTE — Addendum Note (Signed)
Addended by: Otilio Miu on: 03/25/2019 09:19 AM   Modules accepted: Orders

## 2019-03-31 DIAGNOSIS — M9981 Other biomechanical lesions of cervical region: Secondary | ICD-10-CM | POA: Diagnosis not present

## 2019-04-13 ENCOUNTER — Other Ambulatory Visit: Payer: Self-pay | Admitting: Internal Medicine

## 2019-04-13 DIAGNOSIS — R200 Anesthesia of skin: Secondary | ICD-10-CM | POA: Diagnosis not present

## 2019-04-13 NOTE — Telephone Encounter (Signed)
Done erx 

## 2019-04-16 ENCOUNTER — Ambulatory Visit (INDEPENDENT_AMBULATORY_CARE_PROVIDER_SITE_OTHER)
Admission: RE | Admit: 2019-04-16 | Discharge: 2019-04-16 | Disposition: A | Discharge status: Home / Self Care | Payer: Self-pay | Source: Ambulatory Visit | Attending: Internal Medicine | Admitting: Internal Medicine

## 2019-04-16 ENCOUNTER — Other Ambulatory Visit: Payer: Self-pay

## 2019-04-16 DIAGNOSIS — E785 Hyperlipidemia, unspecified: Secondary | ICD-10-CM

## 2019-04-19 ENCOUNTER — Encounter: Payer: Self-pay | Admitting: Internal Medicine

## 2019-04-19 ENCOUNTER — Telehealth: Payer: Self-pay | Admitting: Internal Medicine

## 2019-04-19 DIAGNOSIS — I2510 Atherosclerotic heart disease of native coronary artery without angina pectoris: Secondary | ICD-10-CM

## 2019-04-19 HISTORY — DX: Atherosclerotic heart disease of native coronary artery without angina pectoris: I25.10

## 2019-04-19 MED ORDER — ROSUVASTATIN CALCIUM 20 MG PO TABS: 20.0000 mg | ORAL_TABLET | Freq: Every day | ORAL | 3 refills | Status: DC

## 2019-04-19 NOTE — Telephone Encounter (Signed)
none

## 2019-04-21 ENCOUNTER — Inpatient Hospital Stay: Admission: RE | Admit: 2019-04-21 | Payer: BC Managed Care – PPO | Source: Ambulatory Visit

## 2019-05-03 DIAGNOSIS — G5603 Carpal tunnel syndrome, bilateral upper limbs: Secondary | ICD-10-CM | POA: Diagnosis not present

## 2019-05-03 DIAGNOSIS — M5412 Radiculopathy, cervical region: Secondary | ICD-10-CM | POA: Diagnosis not present

## 2019-05-03 DIAGNOSIS — G5600 Carpal tunnel syndrome, unspecified upper limb: Secondary | ICD-10-CM | POA: Insufficient documentation

## 2019-06-15 ENCOUNTER — Other Ambulatory Visit: Payer: Self-pay | Admitting: Internal Medicine

## 2019-07-07 DIAGNOSIS — F329 Major depressive disorder, single episode, unspecified: Secondary | ICD-10-CM | POA: Diagnosis not present

## 2019-07-07 DIAGNOSIS — R29818 Other symptoms and signs involving the nervous system: Secondary | ICD-10-CM | POA: Insufficient documentation

## 2019-07-07 DIAGNOSIS — R259 Unspecified abnormal involuntary movements: Secondary | ICD-10-CM | POA: Insufficient documentation

## 2019-07-07 DIAGNOSIS — R4189 Other symptoms and signs involving cognitive functions and awareness: Secondary | ICD-10-CM | POA: Insufficient documentation

## 2019-07-07 DIAGNOSIS — F419 Anxiety disorder, unspecified: Secondary | ICD-10-CM | POA: Insufficient documentation

## 2019-07-07 DIAGNOSIS — E559 Vitamin D deficiency, unspecified: Secondary | ICD-10-CM | POA: Diagnosis not present

## 2019-07-07 DIAGNOSIS — F32A Depression, unspecified: Secondary | ICD-10-CM | POA: Insufficient documentation

## 2019-07-07 DIAGNOSIS — R413 Other amnesia: Secondary | ICD-10-CM | POA: Diagnosis not present

## 2019-07-07 DIAGNOSIS — R6889 Other general symptoms and signs: Secondary | ICD-10-CM | POA: Diagnosis not present

## 2019-07-22 ENCOUNTER — Telehealth: Payer: Self-pay | Admitting: Internal Medicine

## 2019-07-22 DIAGNOSIS — J069 Acute upper respiratory infection, unspecified: Secondary | ICD-10-CM

## 2019-07-22 NOTE — Telephone Encounter (Signed)
Order done

## 2019-07-22 NOTE — Telephone Encounter (Signed)
There would be no need if the vaccine has already been done, so let me know if still wants the test

## 2019-07-22 NOTE — Telephone Encounter (Signed)
    Patient requesting order for Covid antibody testing Patient states he has never tested positive, he has been exposed.

## 2019-07-22 NOTE — Telephone Encounter (Signed)
See below

## 2019-07-22 NOTE — Addendum Note (Signed)
Addended by: Biagio Borg on: 07/22/2019 08:32 PM   Modules accepted: Orders

## 2019-07-23 ENCOUNTER — Other Ambulatory Visit: Payer: BC Managed Care – PPO

## 2019-07-23 DIAGNOSIS — J069 Acute upper respiratory infection, unspecified: Secondary | ICD-10-CM

## 2019-07-23 LAB — SARS-COV-2 IGG: SARS-COV-2 IgG: 0.03

## 2019-07-28 ENCOUNTER — Encounter (INDEPENDENT_AMBULATORY_CARE_PROVIDER_SITE_OTHER): Payer: Self-pay | Admitting: Otolaryngology

## 2019-07-28 ENCOUNTER — Ambulatory Visit (INDEPENDENT_AMBULATORY_CARE_PROVIDER_SITE_OTHER): Payer: BC Managed Care – PPO | Admitting: Otolaryngology

## 2019-07-28 ENCOUNTER — Other Ambulatory Visit: Payer: Self-pay

## 2019-07-28 VITALS — Temp 97.2°F

## 2019-07-28 DIAGNOSIS — H90A21 Sensorineural hearing loss, unilateral, right ear, with restricted hearing on the contralateral side: Secondary | ICD-10-CM | POA: Diagnosis not present

## 2019-07-28 DIAGNOSIS — H90A22 Sensorineural hearing loss, unilateral, left ear, with restricted hearing on the contralateral side: Secondary | ICD-10-CM | POA: Diagnosis not present

## 2019-07-28 DIAGNOSIS — H9120 Sudden idiopathic hearing loss, unspecified ear: Secondary | ICD-10-CM | POA: Diagnosis not present

## 2019-07-28 DIAGNOSIS — H90A32 Mixed conductive and sensorineural hearing loss, unilateral, left ear with restricted hearing on the contralateral side: Secondary | ICD-10-CM | POA: Diagnosis not present

## 2019-07-28 NOTE — Progress Notes (Signed)
HPI: Brad Ray is a 59 y.o. male who returns today for evaluation of recent hearing change.  Patient has had a long history of inner ear problems and dizziness.  His dizziness and vertigo is actually doing much better.  However over the past few weeks he has noticed some hearing change in the left ear the hearing is actually doing better today than it was a week ago.  His last hearing test with [place] was performed in February of last year.  He has had no vertigo or balance problems recently..  Past Medical History:  Diagnosis Date  . Abdominal pain, right upper quadrant 12/09/2007  . ALLERGIC RHINITIS 03/19/2007  . Allergy   . Coronary artery calcification seen on CT scan 04/19/2019  . DEGENERATIVE JOINT DISEASE, LUMBAR SPINE 12/23/2006  . [place] DISEASE, CERVICAL 12/23/2006  . FATIGUE 04/28/2009  . GERD 12/23/2006  . HYPERCHOLESTEROLEMIA 12/23/2006  . HYPERLIPIDEMIA 03/19/2007  . HYPERSOMNIA 04/28/2009  . HYPERTENSION 03/19/2007  . HYPOTENSION 04/13/2009  . INSOMNIA, HX OF 12/23/2006  . LIVER MASS 12/11/2007  . MENIERE'S DISEASE 12/23/2006  . OTITIS MEDIA, LEFT 04/13/2009  . PERFORATION, TYMPANIC MEMBRANE NOS 12/23/2006   Past Surgical History:  Procedure Laterality Date  . left knee (MCL) repair    . s/p left ear surgury for vertigo    . s/p left knee medial collateral ligament damage     Social History   Socioeconomic History  . Marital status: Married    Spouse name: Not on file  . Number of children: 1  . Years of education: Not on file  . Highest education level: Not on file  Occupational History  . Not on file  Tobacco Use  . Smoking status: Never Smoker  . Smokeless tobacco: Never Used  Substance and Sexual Activity  . Alcohol use: Yes    Comment: weekly beer  . Drug use: No  . Sexual activity: Not on file  Other Topics Concern  . Not on file  Social History Narrative   1 child at [place]   Social Determinants of Health   Financial Resource Strain:   . Difficulty  of Paying Living Expenses:   Food Insecurity:   . Worried About Charity fundraiser in the Last Year:   . Arboriculturist in the Last Year:   Transportation Needs:   . Film/video editor (Medical):   Marland Kitchen Lack of Transportation (Non-Medical):   Physical Activity:   . Days of Exercise per Week:   . Minutes of Exercise per Session:   Stress:   . Feeling of Stress :   Social Connections:   . Frequency of Communication with Friends and Family:   . Frequency of Social Gatherings with Friends and Family:   . Attends Religious Services:   . Active Member of Clubs or Organizations:   . Attends Archivist Meetings:   Marland Kitchen Marital Status:    Family History  Problem Relation Age of Onset  . Diabetes Father   . Hypertension Father   . Heart disease Father   . Alcohol abuse Other   . Colon cancer Neg Hx   . Pancreatic cancer Neg Hx   . Stomach cancer Neg Hx   . Esophageal cancer Neg Hx   . Rectal cancer Neg Hx    Allergies  Allergen Reactions  . Penicillins     Childhood reaction   Prior to Admission medications   Medication Sig Start Date End Date Taking? Authorizing  Provider  ARMOUR THYROID 90 MG tablet Take 1 tablet by mouth daily. 09/02/17  Yes [provider]  diazepam (VALIUM) 5 MG tablet Take 1 tablet (5 mg total) by mouth 3 times/day as needed-between meals & bedtime. 07/17/17  Yes Biagio Borg, MD  methylcellulose (CITRUCEL) oral powder Take by mouth 2 (two) times daily.   Yes [provider]  Multiple Vitamin (MULTIVITAMIN) capsule Take 1 capsule by mouth daily.   Yes [provider]  rosuvastatin (CRESTOR) 20 MG tablet Take 1 tablet (20 mg total) by mouth daily. 04/19/19  Yes Biagio Borg, MD  tadalafil (CIALIS) 20 MG tablet TAKE 1 TABLET BY MOUTH ONCE DAILY AS NEEDED FOR ED 01/06/19  Yes Biagio Borg, MD  telmisartan (MICARDIS) 80 MG tablet TAKE 1 TABLET BY MOUTH EVERY DAY 06/16/19  Yes Biagio Borg, MD  vitamin B-12 (CYANOCOBALAMIN) 500  MCG tablet Take 500 mcg by mouth daily.     Yes [provider]  vitamin C (ASCORBIC ACID) 500 MG tablet Take 1,000 mg by mouth daily.     Yes [provider]  Vitamin D, Ergocalciferol, (DRISDOL) 1.25 MG (50000 UT) CAPS capsule Take 1 capsule (50,000 Units total) by mouth every 7 (seven) days. 01/20/18  Yes Lyndal Pulley, DO  zolpidem (AMBIEN) 10 MG tablet TAKE 1 TABLET BY MOUTH EVERYDAY AT BEDTIME 04/13/19  Yes Biagio Borg, MD     Positive ROS: Otherwise negative  All other systems have been reviewed and were otherwise negative with the exception of those mentioned in the HPI and as above.  Physical Exam: Constitutional: Alert, well-appearing, no acute distress Ears: External ears without lesions or tenderness. Ear canals are clear bilaterally.  TMs are clear bilaterally with good mobility on pneumatic otoscopy. Nasal: External nose without lesions. Clear nasal passages Oral: Lips and gums without lesions. Tongue and palate mucosa without lesions. Posterior oropharynx clear. Neck: No palpable adenopathy or masses Respiratory: Breathing comfortably  Skin: No facial/neck lesions or rash noted.  Hearing test today demonstrated a significant drop in hearing in the left ear of approximately 15-20 dB compared to previous hearing test on 04/22/2018.  He also has a minor drop in hearing in the right ear in the upper frequencies above 4000.  SRT's were 15 dB on the right side and 90 dB on the left side.  He had type A tympanograms bilaterally.  Procedures  Assessment: Sudden left ear hearing change.  Plan: Placed him on a steroid taper over the next 2 weeks starting with 60 mg daily for 4 days then reducing to 50 mg for 2 days followed by 40 mg for 2 days and then taper off over the next 6 days. Discussed with him concerning possible hearing aid for the left ear if interested as he has had longstanding hearing loss in the left ear. He will contact us depending on how he  responds to the steroid.  He did not want to schedule a repeat audiogram in 2 weeks at this point.   Radene Journey, MD

## 2019-08-06 DIAGNOSIS — R4182 Altered mental status, unspecified: Secondary | ICD-10-CM | POA: Diagnosis not present

## 2019-08-16 ENCOUNTER — Encounter (INDEPENDENT_AMBULATORY_CARE_PROVIDER_SITE_OTHER): Payer: Self-pay

## 2019-08-31 DIAGNOSIS — H903 Sensorineural hearing loss, bilateral: Secondary | ICD-10-CM | POA: Diagnosis not present

## 2019-09-21 ENCOUNTER — Encounter (INDEPENDENT_AMBULATORY_CARE_PROVIDER_SITE_OTHER): Payer: Self-pay

## 2019-10-09 ENCOUNTER — Other Ambulatory Visit: Payer: Self-pay | Admitting: Internal Medicine

## 2019-10-10 NOTE — Telephone Encounter (Signed)
Done erx to [facility] 

## 2019-10-12 DIAGNOSIS — Z20822 Contact with and (suspected) exposure to covid-19: Secondary | ICD-10-CM | POA: Diagnosis not present

## 2019-10-12 DIAGNOSIS — B349 Viral infection, unspecified: Secondary | ICD-10-CM | POA: Diagnosis not present

## 2019-10-12 DIAGNOSIS — J029 Acute pharyngitis, unspecified: Secondary | ICD-10-CM | POA: Diagnosis not present

## 2019-10-16 ENCOUNTER — Encounter: Payer: Self-pay | Admitting: Family Medicine

## 2019-10-16 ENCOUNTER — Telehealth (INDEPENDENT_AMBULATORY_CARE_PROVIDER_SITE_OTHER): Payer: BC Managed Care – PPO | Admitting: Family Medicine

## 2019-10-16 ENCOUNTER — Telehealth (HOSPITAL_COMMUNITY): Payer: Self-pay | Admitting: Oncology

## 2019-10-16 ENCOUNTER — Encounter: Payer: Self-pay | Admitting: Oncology

## 2019-10-16 VITALS — Temp 99.0°F | Ht 75.0 in

## 2019-10-16 DIAGNOSIS — U071 COVID-19: Secondary | ICD-10-CM | POA: Diagnosis not present

## 2019-10-16 DIAGNOSIS — Z8616 Personal history of COVID-19: Secondary | ICD-10-CM | POA: Insufficient documentation

## 2019-10-16 MED ORDER — PREDNISONE 20 MG PO TABS: 20.0000 mg | ORAL_TABLET | Freq: Two times a day (BID) | ORAL | 0 refills | Status: AC

## 2019-10-16 NOTE — Progress Notes (Signed)
Established Patient Office Visit  Subjective:  Patient ID: Brad Ray, male    DOB: May 05, 1960  Age: 59 y.o. MRN: [id number]  CC:  Chief Complaint  Patient presents with  . Acute Visit    Pt tested posivtive Tuesday.  Sore throat, running nose, chest pain, fever, body aches and chills.  Pt has taking zinc, vit D, nyasin,fish oil.    HPI Brad Ray presents for evaluation and treatment of a 6-day history of headache, fever chills stuffy nose drainage sore throat cough and shortness of breath with exertion.  There has advised been no loss of taste or smell.  He has had mild diarrhea.  [place] wife son daughter and her children.  He tested positive for Covid this past Tuesday.  son tested positive the next day.  None of them have had the vaccine.  Patient does not smoke.  He has chronic noted shortness of breath when walking upstairs.  He is on his way back home from the beach.  Coronary calcifications were noted on a prior CT scan.  Past Medical History:  Diagnosis Date  . Abdominal pain, right upper quadrant 12/09/2007  . ALLERGIC RHINITIS 03/19/2007  . Allergy   . Coronary artery calcification seen on CT scan 04/19/2019  . DEGENERATIVE JOINT DISEASE, LUMBAR SPINE 12/23/2006  . [place] DISEASE, CERVICAL 12/23/2006  . FATIGUE 04/28/2009  . GERD 12/23/2006  . HYPERCHOLESTEROLEMIA 12/23/2006  . HYPERLIPIDEMIA 03/19/2007  . HYPERSOMNIA 04/28/2009  . HYPERTENSION 03/19/2007  . HYPOTENSION 04/13/2009  . INSOMNIA, HX OF 12/23/2006  . LIVER MASS 12/11/2007  . MENIERE'S DISEASE 12/23/2006  . OTITIS MEDIA, LEFT 04/13/2009  . PERFORATION, TYMPANIC MEMBRANE NOS 12/23/2006    Past Surgical History:  Procedure Laterality Date  . left knee (MCL) repair    . s/p left ear surgury for vertigo    . s/p left knee medial collateral ligament damage      Family History  Problem Relation Age of Onset  . Diabetes Father   . Hypertension Father   .  Heart disease Father   . Alcohol abuse Other   . Colon cancer Neg Hx   . Pancreatic cancer Neg Hx   . Stomach cancer Neg Hx   . Esophageal cancer Neg Hx   . Rectal cancer Neg Hx     Social History   Socioeconomic History  . Marital status: Married    Spouse name: Not on file  . Number of children: 1  . Years of education: Not on file  . Highest education level: Not on file  Occupational History  . Not on file  Tobacco Use  . Smoking status: Never Smoker  . Smokeless tobacco: Never Used  Vaping Use  . Vaping Use: Never used  Substance and Sexual Activity  . Alcohol use: Yes    Comment: weekly beer  . Drug use: No  . Sexual activity: Not on file  Other Topics Concern  . Not on file  Social History Narrative   1 child at [place]   Social Determinants of Health   Financial Resource Strain:   . Difficulty of Paying Living Expenses:   Food Insecurity:   . Worried About Charity fundraiser in the Last Year:   . Arboriculturist in the Last Year:   Transportation Needs:   . Film/video editor (Medical):   Marland Kitchen Lack of Transportation (Non-Medical):   Physical  Activity:   . Days of Exercise per Week:   . Minutes of Exercise per Session:   Stress:   . Feeling of Stress :   Social Connections:   . Frequency of Communication with Friends and Family:   . Frequency of Social Gatherings with Friends and Family:   . Attends Religious Services:   . Active Member of Clubs or Organizations:   . Attends Archivist Meetings:   Marland Kitchen Marital Status:   Intimate Partner Violence:   . Fear of Current or Ex-Partner:   . Emotionally Abused:   Marland Kitchen Physically Abused:   . Sexually Abused:     Outpatient Medications Prior to Visit  Medication Sig Dispense Refill  . ARMOUR THYROID 90 MG tablet Take 1 tablet by mouth daily.  2  . diazepam (VALIUM) 5 MG tablet Take 1 tablet (5 mg total) by mouth 3 times/day as needed-between meals & bedtime. 30 tablet 0  . methylcellulose  (CITRUCEL) oral powder Take by mouth 2 (two) times daily.    . Multiple Vitamin (MULTIVITAMIN) capsule Take 1 capsule by mouth daily.    . rosuvastatin (CRESTOR) 20 MG tablet Take 1 tablet (20 mg total) by mouth daily. 90 tablet 3  . tadalafil (CIALIS) 20 MG tablet TAKE 1 TABLET BY MOUTH ONCE DAILY AS NEEDED FOR ED 30 tablet 2  . telmisartan (MICARDIS) 80 MG tablet TAKE 1 TABLET BY MOUTH EVERY DAY 90 tablet 2  . vitamin B-12 (CYANOCOBALAMIN) 500 MCG tablet Take 500 mcg by mouth daily.      . vitamin C (ASCORBIC ACID) 500 MG tablet Take 1,000 mg by mouth daily.      . Vitamin D, Ergocalciferol, (DRISDOL) 1.25 MG (50000 UT) CAPS capsule Take 1 capsule (50,000 Units total) by mouth every 7 (seven) days. 12 capsule 0  . zolpidem (AMBIEN) 10 MG tablet TAKE 1 TABLET BY MOUTH EVERYDAY AT BEDTIME 90 tablet 1   Facility-Administered Medications Prior to Visit  Medication Dose Route Frequency Provider Last Rate Last Admin  . 0.9 %  sodium chloride infusion  500 mL Intravenous Once Doran Stabler, MD        Allergies  Allergen Reactions  . Penicillins     Childhood reaction    ROS Review of Systems  Constitutional: Positive for activity change, fatigue and fever. Negative for unexpected weight change.  HENT: Positive for congestion and postnasal drip.   Eyes: Negative for photophobia and visual disturbance.  Respiratory: Positive for shortness of breath. Negative for chest tightness and wheezing.   Cardiovascular: Negative for chest pain and palpitations.  Gastrointestinal: Positive for diarrhea. Negative for nausea and vomiting.  Musculoskeletal: Positive for myalgias.  Skin: Negative for rash.  Allergic/Immunologic: Negative for immunocompromised state.  Neurological: Positive for headaches. Negative for speech difficulty.  Psychiatric/Behavioral: Negative.       Objective:    Physical Exam Vitals and nursing note reviewed.  Constitutional:      General: He is not in acute  distress.    Appearance: Normal appearance. He is not ill-appearing, toxic-appearing or diaphoretic.  HENT:     Head: Normocephalic and atraumatic.     Right Ear: External ear normal.     Left Ear: External ear normal.  Eyes:     General: No scleral icterus.       Right eye: No discharge.        Left eye: No discharge.     Conjunctiva/sclera: Conjunctivae normal.  Pulmonary:  Effort: Pulmonary effort is normal.  Neurological:     Mental Status: He is alert and oriented to person, place, and time.  Psychiatric:        Mood and Affect: Mood normal.        Behavior: Behavior normal.     Temp 99 F (37.2 C) (Temporal)   Ht 6\' 3"  (1.905 m)   BMI 30.00 kg/m  Wt Readings from Last 3 Encounters:  03/23/19 240 lb (108.9 kg)  04/21/18 245 lb 3.2 oz (111.2 kg)  01/20/18 243 lb (110.2 kg)     Health Maintenance Due  Topic Date Due  . COVID-19 Vaccine (1) Never done  . INFLUENZA VACCINE  10/10/2019    There are no preventive care reminders to display for this patient.  Lab Results  Component Value Date   TSH 1.75 03/18/2019   Lab Results  Component Value Date   WBC 5.4 03/18/2019   HGB 16.0 03/18/2019   HCT 48.0 03/18/2019   MCV 92.5 03/18/2019   PLT 250.0 03/18/2019   Lab Results  Component Value Date   NA 137 03/18/2019   K 4.5 03/18/2019   CO2 28 03/18/2019   GLUCOSE 96 03/18/2019   BUN 14 03/18/2019   CREATININE 1.05 03/18/2019   BILITOT 0.6 03/18/2019   ALKPHOS 59 03/18/2019   AST 17 03/18/2019   ALT 19 03/18/2019   PROT 7.1 03/18/2019   ALBUMIN 4.1 03/18/2019   CALCIUM 9.4 03/18/2019   GFR 72.92 03/18/2019   Lab Results  Component Value Date   CHOL 227 (H) 03/18/2019   Lab Results  Component Value Date   HDL 53.70 03/18/2019   Lab Results  Component Value Date   LDLCALC 155 (H) 12/11/2016   Lab Results  Component Value Date   TRIG 203.0 (H) 03/18/2019   Lab Results  Component Value Date   CHOLHDL 4 03/18/2019   Lab Results    Component Value Date   HGBA1C 5.3 08/03/2015      Assessment & Plan:   Problem List Items Addressed This Visit      Other   COVID-19 - Primary   Relevant Medications   predniSONE (DELTASONE) 20 MG tablet      Meds ordered this encounter  Medications  . predniSONE (DELTASONE) 20 MG tablet    Sig: Take 1 tablet (20 mg total) by mouth 2 (two) times daily with a meal for 7 days.    Dispense:  14 tablet    Refill:  0    Follow-up: Return in about 1 week (around 10/23/2019), or if symptoms worsen or fail to improve.  Advised patient to monitor him self for increasing shortness of breath and/or difficulty breathing.  He has a pulse ox back at his home and will consider emergency evaluation for oxygen saturations consistently running in the less than 95% range and/or shortness of breath.  Infusion center was contacted and given this patient's information.  I explained to him that they will call him if he is considered to be a candidate for infusion in his particular case.  I advised him that the Covid vaccine is still indicated for him and his family as soon as he is feeling better.  This advice did not receive an enthusiastic response.  Virtual Visit via Video Note  I connected with Brad Ray on 10/16/19 at 11:00 AM EDT by a video enabled telemedicine application and verified that I am speaking with the correct person using two identifiers.  Location: Patient[place] multiple family members who are also ill. Provider: home   I discussed the limitations of evaluation and management by telemedicine and the availability of in person appointments. The patient expressed understanding and agreed to proceed.  History of Present Illness:    Observations/Objective:   Assessment and Plan:   Follow Up Instructions:    I discussed the assessment and treatment plan with the patient. The patient was provided an opportunity to ask questions and all were  answered. The patient agreed with the plan and demonstrated an understanding of the instructions.   The patient was advised to call back or seek an in-person evaluation if the symptoms worsen or if the condition fails to improve as anticipated.  I provided 30 minutes of non-face-to-face time during this encounter.   Libby Maw, MD  Libby Maw, MD

## 2019-10-16 NOTE — Telephone Encounter (Signed)
Called to Discuss with patient about Covid symptoms and the use of regeneron, a monoclonal antibody infusion for those with mild to moderate Covid symptoms and at a high risk of hospitalization.     Pt is qualified for this infusion at the [facility] infusion center due to co-morbid conditions and/or a member of an at-risk group.     Unable to reach pt. No VM available.   Rulon Abide, NP, AGNP-C [phone] ([place])

## 2019-10-18 ENCOUNTER — Other Ambulatory Visit: Payer: Self-pay | Admitting: Nurse Practitioner

## 2019-10-18 ENCOUNTER — Telehealth: Payer: Self-pay | Admitting: Adult Health

## 2019-10-18 DIAGNOSIS — U071 COVID-19: Secondary | ICD-10-CM

## 2019-10-18 DIAGNOSIS — I10 Essential (primary) hypertension: Secondary | ICD-10-CM

## 2019-10-18 NOTE — Progress Notes (Signed)
I connected by phone with Brad Ray on 10/18/2019 at 11:37 AM to discuss the potential use of a new treatment for mild to moderate COVID-19 viral infection in non-hospitalized patients.  This patient is a 59 y.o. male that meets the FDA criteria for Emergency Use Authorization of COVID monoclonal antibody casirivimab/imdevimab.  Has a (+) direct SARS-CoV-2 viral test result  Has mild or moderate COVID-19   Is NOT hospitalized due to COVID-19  Is within 10 days of symptom onset  Has at least one of the high risk factor(s) for progression to severe COVID-19 and/or hospitalization as defined in EUA.  Specific high risk criteria : Cardiovascular disease or hypertension   I have spoken and communicated the following to the patient or parent/caregiver regarding COVID monoclonal antibody treatment:  1. FDA has authorized the emergency use for the treatment of mild to moderate COVID-19 in adults and pediatric patients with positive results of direct SARS-CoV-2 viral testing who are 36 years of age and older weighing at least 40 kg, and who are at high risk for progressing to severe COVID-19 and/or hospitalization.  2. The significant known and potential risks and benefits of COVID monoclonal antibody, and the extent to which such potential risks and benefits are unknown.  3. Information on available alternative treatments and the risks and benefits of those alternatives, including clinical trials.  4. Patients treated with COVID monoclonal antibody should continue to self-isolate and use infection control measures (e.g., wear mask, isolate, social distance, avoid sharing personal items, clean and disinfect "high touch" surfaces, and frequent handwashing) according to CDC guidelines.   5. The patient or parent/caregiver has the option to accept or refuse COVID monoclonal antibody treatment.  After reviewing this information with the patient, The patient agreed to proceed with receiving  casirivimab\imdevimab infusion and will be provided a copy of the Fact sheet prior to receiving the infusion. [name] 10/18/2019 11:37 AM

## 2019-10-18 NOTE — Telephone Encounter (Signed)
LMOM that I am returning his call that he left on monoclonal infusion antibody line.  Asked that he call back for any needs he may have.  Wilber Bihari, NP

## 2019-10-19 ENCOUNTER — Ambulatory Visit (HOSPITAL_COMMUNITY)
Admission: RE | Admit: 2019-10-19 | Discharge: 2019-10-19 | Disposition: A | Discharge status: Home / Self Care | Payer: BC Managed Care – PPO | Source: Ambulatory Visit | Attending: Pulmonary Disease | Admitting: Pulmonary Disease

## 2019-10-19 DIAGNOSIS — U071 COVID-19: Secondary | ICD-10-CM | POA: Diagnosis not present

## 2019-10-19 DIAGNOSIS — I10 Essential (primary) hypertension: Secondary | ICD-10-CM | POA: Diagnosis not present

## 2019-10-19 MED ORDER — SODIUM CHLORIDE 0.9 % IV SOLN
1200.0000 mg | Freq: Once | INTRAVENOUS | Status: AC
  Administered 2019-10-19: 1200 mg via INTRAVENOUS
  Filled 2019-10-19: qty 600

## 2019-10-19 MED ORDER — SODIUM CHLORIDE 0.9 % IV SOLN: INTRAVENOUS | Status: DC | PRN

## 2019-10-19 MED ORDER — EPINEPHRINE 0.3 MG/0.3ML IJ SOAJ: 0.3000 mg | Freq: Once | INTRAMUSCULAR | Status: DC | PRN

## 2019-10-19 MED ORDER — METHYLPREDNISOLONE SODIUM SUCC 125 MG IJ SOLR: 125.0000 mg | Freq: Once | INTRAMUSCULAR | Status: DC | PRN

## 2019-10-19 MED ORDER — DIPHENHYDRAMINE HCL 50 MG/ML IJ SOLN: 50.0000 mg | Freq: Once | INTRAMUSCULAR | Status: DC | PRN

## 2019-10-19 MED ORDER — ALBUTEROL SULFATE HFA 108 (90 BASE) MCG/ACT IN AERS: 2.0000 | INHALATION_SPRAY | Freq: Once | RESPIRATORY_TRACT | Status: DC | PRN

## 2019-10-19 MED ORDER — FAMOTIDINE IN NACL 20-0.9 MG/50ML-% IV SOLN: 20.0000 mg | Freq: Once | INTRAVENOUS | Status: DC | PRN

## 2019-10-19 NOTE — Progress Notes (Signed)
  Diagnosis: COVID-19  Physician: Dr. Patrick Wright  Procedure: Covid Infusion [facility] Med: casirivimab\imdevimab infusion - Provided patient with casirivimab\imdevimab fact sheet for patients, parents and caregivers prior to infusion.  Complications: No immediate complications noted.  Discharge: Discharged home   Brad Ray [name] 10/19/2019  

## 2019-10-19 NOTE — Discharge Instructions (Signed)

## 2019-10-20 ENCOUNTER — Ambulatory Visit (HOSPITAL_COMMUNITY): Payer: BC Managed Care – PPO

## 2019-10-21 ENCOUNTER — Other Ambulatory Visit: Payer: Self-pay | Admitting: Pulmonary Disease

## 2019-10-21 ENCOUNTER — Telehealth: Payer: Self-pay | Admitting: Pulmonary Disease

## 2019-10-21 MED ORDER — AZITHROMYCIN 250 MG PO TABS
250.0000 mg | ORAL_TABLET | Freq: Every day | ORAL | 0 refills | Status: DC
Start: 2019-10-21 — End: 2019-11-10

## 2019-10-21 NOTE — Telephone Encounter (Signed)
Symptoms likely still related to his Covid infection  Nothing else to do at present Of course there is the possibility that he may get worse Needs to come to the hospital if he is feeling worse  I will send in prescription for azithromycin, continue with the current steroids that he has been prescribed  To go to the hospital if worsening

## 2019-10-21 NOTE — Telephone Encounter (Signed)
Patient test positive for covid 8/2, casirivimab/imdevimab infusion on Tuesday, reporting increase SOB, patient sats 90% on room air, cough with brown sputum, feeling worse today. Please advise.

## 2019-10-21 NOTE — Telephone Encounter (Signed)
Patient called with recommendations from Dr. Ander Slade, message read to patient. His entire family has covid, he will start his antibiotics, check his saturations more frequently, use a good pulmonary toilet, drink lots of fluids. Emergency plan reviewed that patient will seek emergent care at the ED if she has a sudden change in breathing or his saturations drop below 88%. Patient verbalized understanding of plan. He will call us with any changes as long as he continues to have good saturations and is breathing without distress otherwise he will call 911 or go to the emergency department.

## 2019-10-25 ENCOUNTER — Telehealth: Payer: Self-pay | Admitting: Pulmonary Disease

## 2019-10-25 NOTE — Telephone Encounter (Signed)
Patient confirming f/u appointment. Asking if he needs an x ray, advised to speak with the provider at appointment.

## 2019-10-27 ENCOUNTER — Ambulatory Visit (INDEPENDENT_AMBULATORY_CARE_PROVIDER_SITE_OTHER): Payer: BC Managed Care – PPO | Admitting: Pulmonary Disease

## 2019-10-27 ENCOUNTER — Encounter: Payer: Self-pay | Admitting: Pulmonary Disease

## 2019-10-27 ENCOUNTER — Other Ambulatory Visit: Payer: Self-pay

## 2019-10-27 ENCOUNTER — Ambulatory Visit (INDEPENDENT_AMBULATORY_CARE_PROVIDER_SITE_OTHER): Payer: BC Managed Care – PPO

## 2019-10-27 VITALS — BP 128/68 | HR 89 | Temp 97.9°F | Ht 76.0 in | Wt 233.0 lb

## 2019-10-27 DIAGNOSIS — J189 Pneumonia, unspecified organism: Secondary | ICD-10-CM | POA: Diagnosis not present

## 2019-10-27 DIAGNOSIS — G4733 Obstructive sleep apnea (adult) (pediatric): Secondary | ICD-10-CM

## 2019-10-27 DIAGNOSIS — U071 COVID-19: Secondary | ICD-10-CM

## 2019-10-27 NOTE — Assessment & Plan Note (Addendum)
Tested positive for Covid on 10/12/2019 Received monoclonal antibody infusion on 10/19/2019 Unvaccinated Vital signs stable today Afebrile  Plan: Chest x-ray today If abnormal chest x-ray will need to order pulmonary function testing We will have patient follow-up in 2 months Notify patient to let us know if symptoms worsen such as fever, discolored mucus, worsened fatigue Explained to patient that length of course and recovery of Covid is varied.  Patient can continue to have a dry occasionally productive cough 4 months status post COVID-19 Recommended patient obtain COVID-19 vaccination in 90 days

## 2019-10-27 NOTE — Patient Instructions (Addendum)
You were seen today by Lauraine Rinne, NP  for:   1. Severe obstructive sleep apnea  We recommend that you continue using your CPAP daily >>>Keep up the hard work using your device >>> Goal should be wearing this for the entire night that you are sleeping, at least 4 to 6 hours  Remember:  . Do not drive or operate heavy machinery if tired or drowsy.  . Please notify the supply company and office if you are unable to use your device regularly due to missing supplies or machine being broken.  . Work on maintaining a healthy weight and following your recommended nutrition plan  . Maintain proper daily exercise and movement  . Maintaining proper use of your device can also help improve management of other chronic illnesses such as: Blood pressure, blood sugars, and weight management.   BiPAP/ CPAP Cleaning:  >>>Clean weekly, with Dawn soap, and bottle brush.  Set up to air dry. >>> Wipe mask out daily with wet wipe or towelette  2. COVID-19 virus detected  Chest Xray today   Based off the xray   Follow Up:    Return in about 2 months (around 12/27/2019), or if symptoms worsen or fail to improve, for Follow up with Dr. Ander Slade.   Please do your part to reduce the spread of COVID-19:      Reduce your risk of any infection  and COVID19 by using the similar precautions used for avoiding the common cold or flu:  Marland Kitchen Wash your hands often with soap and warm water for at least 20 seconds.  If soap and water are not readily available, use an alcohol-based hand sanitizer with at least 60% alcohol.  . If coughing or sneezing, cover your mouth and nose by coughing or sneezing into the elbow areas of your shirt or coat, into a tissue or into your sleeve (not your hands). Langley Gauss A MASK when in public  . Avoid shaking hands with others and consider head nods or verbal greetings only. . Avoid touching your eyes, nose, or mouth with unwashed hands.  . Avoid close contact with people who are  sick. . Avoid places or events with large numbers of people in one location, like concerts or sporting events. . If you have some symptoms but not all symptoms, continue to monitor at home and seek medical attention if your symptoms worsen. . If you are having a medical emergency, call 911.   [place] / e-Visit: [email or web address]         [facility]: [facility]: [phone]                   [facility]: [phone]     It is flu season:   >>> Best ways to protect herself from the flu: Receive the yearly flu vaccine, practice good hand hygiene washing with soap and also using hand sanitizer when available, eat a nutritious meals, get adequate rest, hydrate appropriately   Please contact the office if your symptoms worsen or you have concerns that you are not improving.   Thank you for choosing [facility] for your healthcare, and for allowing [place] to partner with you on your healthcare journey. I am thankful to be able to provide care to you today.   Wyn Quaker FNP-C

## 2019-10-27 NOTE — Progress Notes (Signed)
@Patient  ID: Brad Ray, male    DOB: [birth year], 59 y.o.   MRN: [id number]  Chief Complaint  Patient presents with  . Follow-up    F/u COVID-low oxygen 90%, sob with exertion,occass. wheeze, cough-white    Referring provider: Biagio Borg, MD  HPI:  59 year old male never smoker followed in our office for severe obstructive sleep apnea and recent COVID-19 infection (August/2021, received monoclonal antibody infusion)  PMH: Hyperlipidemia, GERD, hypertension, fatigue, insomnia Smoker/ Smoking History: Never smoker Maintenance: None Pt of: Dr. Jenetta Downer  10/27/2019  - Visit   59 year old male never smoker followed in our office for severe obstructive sleep apnea and COVID-19 infection.  Patient tested positive for Covid on 10/27/2019.  He is presenting today for follow-up.  He received the monoclonal antibody infusion on 10/19/2019.  He contacted our office on 10/21/2019 requesting an x-ray.  Patient contacted our office on 10/21/2019 reporting that he was having acute worsening symptoms.  He was prescribed azithromycin as well as already maintained on steroids.  He was instructed to present to the hospital if symptoms worsen.  Patient reports that entire family has tested positive for Covid.  Children are recovering.  Spouse and him are still having lingering symptoms.  Spouse was unable to receive the monoclonal antibody infusion.  Patient reporting that oxygen levels have been lower than usual.  Oxygen levels ranging between 92 and 96%.  He reports that typically he is above 97%.  Oxygen levels today in office are 94%.  Questionaires / Pulmonary Flowsheets:   ACT:  No flowsheet data found.  MMRC: No flowsheet data found.  Epworth:  Results of the Epworth flowsheet 08/31/2018 04/21/2018  Sitting and reading 1 2  Watching TV 1 2  Sitting, inactive in a public place (e.g. a theatre or a meeting) 0 2  As a passenger in a car for an hour without a break 1 2  Lying down to rest in the  afternoon when circumstances permit 2 3  Sitting and talking to someone 0 0  Sitting quietly after a lunch without alcohol 0 1  In a car, while stopped for a few minutes in traffic 0 0  Total score 5 12    Tests:   05/13/2018-Home sleep study- AHI 42.9-hour  10/17/2019-SARS-CoV-2-positive 10/19/2019-COVID-19 infusion/monoclonal antibody  FENO:  No results found for: NITRICOXIDE  PFT: No flowsheet data found.  WALK:  No flowsheet data found.  Imaging: No results found.  Lab Results:  CBC    Component Value Date/Time   WBC 5.4 03/18/2019 1331   RBC 5.19 03/18/2019 1331   HGB 16.0 03/18/2019 1331   HCT 48.0 03/18/2019 1331   PLT 250.0 03/18/2019 1331   MCV 92.5 03/18/2019 1331   MCHC 33.4 03/18/2019 1331   RDW 13.7 03/18/2019 1331   LYMPHSABS 1.9 03/18/2019 1331   MONOABS 0.5 03/18/2019 1331   EOSABS 0.1 03/18/2019 1331   BASOSABS 0.0 03/18/2019 1331    BMET    Component Value Date/Time   NA 137 03/18/2019 1331   K 4.5 03/18/2019 1331   CL 102 03/18/2019 1331   CO2 28 03/18/2019 1331   GLUCOSE 96 03/18/2019 1331   BUN 14 03/18/2019 1331   CREATININE 1.05 03/18/2019 1331   CALCIUM 9.4 03/18/2019 1331   GFRNONAA 63.00 04/13/2009 1404   GFRAA 104 12/09/2007 1654    BNP No results found for: BNP  ProBNP No results found for: PROBNP  Specialty Problems      Pulmonary Problems  Seasonal rhinitis    Qualifier: Diagnosis of  By: Jenny Reichmann MD, Hunt Oris       Abnormal radiologic finding of lung field   Severe obstructive sleep apnea    04/21/2018- epworth - 12  05/13/2018-Home sleep study- AHI 42.9-hour  Started CPAP therapy in march/2020  08/31/2018 - epworth - 5         Allergies  Allergen Reactions  . Penicillins     Childhood reaction    Immunization History  Administered Date(s) Administered  . Influenza Whole 01/11/1999  . Influenza, Seasonal, Injecte, Preservative Fre 03/13/2012  . Influenza,inj,Quad PF,6+ Mos 10/31/2016, 01/20/2018  .  Tdap 07/17/2017    Past Medical History:  Diagnosis Date  . Abdominal pain, right upper quadrant 12/09/2007  . ALLERGIC RHINITIS 03/19/2007  . Allergy   . Coronary artery calcification seen on CT scan 04/19/2019  . DEGENERATIVE JOINT DISEASE, LUMBAR SPINE 12/23/2006  . [place] DISEASE, CERVICAL 12/23/2006  . FATIGUE 04/28/2009  . GERD 12/23/2006  . HYPERCHOLESTEROLEMIA 12/23/2006  . HYPERLIPIDEMIA 03/19/2007  . HYPERSOMNIA 04/28/2009  . HYPERTENSION 03/19/2007  . HYPOTENSION 04/13/2009  . INSOMNIA, HX OF 12/23/2006  . LIVER MASS 12/11/2007  . MENIERE'S DISEASE 12/23/2006  . OTITIS MEDIA, LEFT 04/13/2009  . PERFORATION, TYMPANIC MEMBRANE NOS 12/23/2006    Tobacco History: Social History   Tobacco Use  Smoking Status Never Smoker  Smokeless Tobacco Never Used   Counseling given: Yes  Continue to not smoke  Outpatient Encounter Medications as of 10/27/2019  Medication Sig  . diazepam (VALIUM) 5 MG tablet Take 1 tablet (5 mg total) by mouth 3 times/day as needed-between meals & bedtime.  . methylcellulose (CITRUCEL) oral powder Take by mouth 2 (two) times daily.  . Multiple Vitamin (MULTIVITAMIN) capsule Take 1 capsule by mouth daily.  . tadalafil (CIALIS) 20 MG tablet TAKE 1 TABLET BY MOUTH ONCE DAILY AS NEEDED FOR ED  . telmisartan (MICARDIS) 80 MG tablet TAKE 1 TABLET BY MOUTH EVERY DAY  . vitamin B-12 (CYANOCOBALAMIN) 500 MCG tablet Take 500 mcg by mouth daily.    . vitamin C (ASCORBIC ACID) 500 MG tablet Take 1,000 mg by mouth daily.    . Vitamin D, Ergocalciferol, (DRISDOL) 1.25 MG (50000 UT) CAPS capsule Take 1 capsule (50,000 Units total) by mouth every 7 (seven) days.  Marland Kitchen zolpidem (AMBIEN) 10 MG tablet TAKE 1 TABLET BY MOUTH EVERYDAY AT BEDTIME  . ARMOUR THYROID 90 MG tablet Take 1 tablet by mouth daily. (Patient not taking: Reported on 10/27/2019)  . azithromycin (ZITHROMAX) 250 MG tablet Take 1 tablet (250 mg total) by mouth daily. 500 mg on day 1 and 250 mg daily for 4 days (Patient  not taking: Reported on 10/27/2019)  . rosuvastatin (CRESTOR) 20 MG tablet Take 1 tablet (20 mg total) by mouth daily. (Patient not taking: Reported on 10/27/2019)   Facility-Administered Encounter Medications as of 10/27/2019  Medication  . 0.9 %  sodium chloride infusion     Review of Systems  Review of Systems  Constitutional: Positive for fatigue. Negative for activity change, chills, fever and unexpected weight change.  HENT: Negative for postnasal drip, rhinorrhea, sinus pressure, sinus pain and sore throat.   Eyes: Negative.   Respiratory: Positive for cough and shortness of breath. Negative for wheezing.   Cardiovascular: Negative for chest pain and palpitations.  Gastrointestinal: Negative for constipation, diarrhea, nausea and vomiting.  Endocrine: Negative.   Genitourinary: Negative.   Musculoskeletal: Negative.   Skin: Negative.   Neurological: Negative for  dizziness and headaches.  Psychiatric/Behavioral: Negative.  Negative for dysphoric mood. The patient is not nervous/anxious.   All other systems reviewed and are negative.    Physical Exam  BP 128/68 (BP Location: Left Arm, Cuff Size: Normal)   Pulse 89   Temp 97.9 F (36.6 C) (Oral)   Ht 6\' 4"  (1.93 m)   Wt 233 lb (105.7 kg)   SpO2 94%   BMI 28.36 kg/m   Wt Readings from Last 5 Encounters:  10/27/19 233 lb (105.7 kg)  03/23/19 240 lb (108.9 kg)  04/21/18 245 lb 3.2 oz (111.2 kg)  01/20/18 243 lb (110.2 kg)  01/20/18 243 lb (110.2 kg)    BMI Readings from Last 5 Encounters:  10/27/19 28.36 kg/m  10/16/19 30.00 kg/m  03/23/19 30.00 kg/m  04/21/18 30.65 kg/m  01/20/18 30.37 kg/m     Physical Exam Vitals and nursing note reviewed.  Constitutional:      General: He is not in acute distress.    Appearance: Normal appearance. He is obese.  HENT:     Head: Normocephalic and atraumatic.     Right Ear: Hearing, tympanic membrane, ear canal and external ear normal. There is no impacted cerumen.       Left Ear: Hearing, tympanic membrane, ear canal and external ear normal. There is no impacted cerumen.     Nose: Nose normal. No mucosal edema or rhinorrhea.     Right Turbinates: Not enlarged.     Left Turbinates: Not enlarged.     Mouth/Throat:     Mouth: Mucous membranes are dry.     Pharynx: Oropharynx is clear. No oropharyngeal exudate.  Eyes:     Pupils: Pupils are equal, round, and reactive to light.  Cardiovascular:     Rate and Rhythm: Normal rate and regular rhythm.     Pulses: Normal pulses.     Heart sounds: Normal heart sounds. No murmur heard.   Pulmonary:     Effort: Pulmonary effort is normal.     Breath sounds: Normal breath sounds. No decreased breath sounds, wheezing or rales.  Musculoskeletal:     Cervical back: Normal range of motion.     Right lower leg: No edema.     Left lower leg: No edema.  Lymphadenopathy:     Cervical: No cervical adenopathy.  Skin:    General: Skin is warm and dry.     Capillary Refill: Capillary refill takes less than 2 seconds.     Findings: No erythema or rash.  Neurological:     General: No focal deficit present.     Mental Status: He is alert and oriented to person, place, and time.     Motor: No weakness.     Coordination: Coordination normal.     Gait: Gait is intact. Gait normal.  Psychiatric:        Mood and Affect: Mood normal.        Behavior: Behavior normal. Behavior is cooperative.        Thought Content: Thought content normal.        Judgment: Judgment normal.       Assessment & Plan:   Severe obstructive sleep apnea Plan: Continue CPAP therapy Follow-up in 2 months  COVID-19 virus detected Tested positive for Covid on 10/12/2019 Received monoclonal antibody infusion on 10/19/2019 Unvaccinated Vital signs stable today Afebrile  Plan: Chest x-ray today If abnormal chest x-ray will need to order pulmonary function testing We will have patient follow-up in 2  months Notify patient to let us know  if symptoms worsen such as fever, discolored mucus, worsened fatigue Explained to patient that length of course and recovery of Covid is varied.  Patient can continue to have a dry occasionally productive cough 4 months status post COVID-19 Recommended patient obtain COVID-19 vaccination in 90 days    Return in about 2 months (around 12/27/2019), or if symptoms worsen or fail to improve, for Follow up with Dr. Ander Slade.   Lauraine Rinne, NP 10/27/2019   This appointment required 32 minutes of patient care (this includes precharting, chart review, review of results, face-to-face care, etc.).

## 2019-10-27 NOTE — Assessment & Plan Note (Signed)
Plan: Continue CPAP therapy Follow-up in 2 months

## 2019-10-28 ENCOUNTER — Other Ambulatory Visit: Payer: Self-pay | Admitting: Pulmonary Disease

## 2019-10-28 DIAGNOSIS — J849 Interstitial pulmonary disease, unspecified: Secondary | ICD-10-CM

## 2019-10-28 NOTE — Progress Notes (Unsigned)
10/28/19    

## 2019-10-29 ENCOUNTER — Telehealth: Payer: Self-pay | Admitting: Pulmonary Disease

## 2019-10-29 DIAGNOSIS — U071 COVID-19: Secondary | ICD-10-CM

## 2019-10-29 NOTE — Telephone Encounter (Signed)
Called and spoke with patient. He stated that he received a call from someone yesterday in regards to his CXR results. He stated that he wasn't familiar with the terminology used in the report or in Brian's recommendations. He was most confused with "bilateral heterogeneous patchy lung opacities". I attempted to go over the results again with him, but he wanted to only speak with Aaron Edelman. He wants to hold off on scheduling on the CT scan.   Aaron Edelman, can you please advise?

## 2019-10-29 NOTE — Telephone Encounter (Signed)
Please contact the patient and figure out what his questions are.  I am actively seeing patients in clinic.  This should be routed to triage.  Wyn Quaker FNP

## 2019-10-29 NOTE — Telephone Encounter (Signed)
10/29/2019  Please let the patient know that   Bilateral patchy lung opacities refer to Covid pneumonia.  This is directly related to him having SARS-CoV-2 infection.  Thankfully he received the monoclonal antibody infusion.  This should slowly improve.  We will follow these imaging findings with a high-resolution CT chest in around 3 months.  This will obtain better imaging of his lungs as well as ensure there is not permanent scarring or damage after the groundglass opacities clear.  This is also quite important that we obtain pulmonary function testing.  Hopefully that helps clarify the patient's question.  Wyn Quaker, FNP

## 2019-10-29 NOTE — Telephone Encounter (Signed)
When I called the patient to schedule his Ct he was asking if Aaron Edelman was available to speak with him about his chest xray results. He stated the nurse that called him with the results  was not familiar with the terminology and wanted to talk with Aaron Edelman  [phone]

## 2019-11-01 NOTE — Telephone Encounter (Signed)
Spoke with Wyn Quaker NP and clarified that the high resolution CT chest is to be done in 3 months.  Called and spoke with patient and explained again that the CT does not need to be done for 3 months and we would cancel the one for 3 months.  He verbalized understanding.  Nothing further needed.  Called and spoke with patient and provided explanation per Wyn Quaker NP.  As I was explaining, he interrupted me and let me know that he had a CT scan ordered for Wednesday of this week.  I advised him that he wanted it in 3 months to make sure things were improving and that we would cancel the CT for Wednesday and order it for 3 months for now.  He stated it was ordered and he would have it on Wednesday.  I advised him that it was not necessary to have it done now and it would not show anything different than he already knows now. I advised him I would clarify with NP and call him back.  [facility] pool, please cancel the HR CT chest ordered for Wednesday of this week, it should be scheduled for 3 months, I have put in a new order.

## 2019-11-02 ENCOUNTER — Ambulatory Visit: Payer: BC Managed Care – PPO | Admitting: Pulmonary Disease

## 2019-11-02 NOTE — Telephone Encounter (Signed)
I called [facility] & canceled CT for this week.  Nothing further needed at this point.  Pt will be called in Oct to sched CT for November.

## 2019-11-03 ENCOUNTER — Inpatient Hospital Stay: Admission: RE | Admit: 2019-11-03 | Payer: BC Managed Care – PPO | Source: Ambulatory Visit

## 2019-11-08 ENCOUNTER — Telehealth: Payer: Self-pay | Admitting: Pulmonary Disease

## 2019-11-08 ENCOUNTER — Telehealth: Payer: Self-pay

## 2019-11-08 DIAGNOSIS — R000 Tachycardia, unspecified: Secondary | ICD-10-CM

## 2019-11-08 NOTE — Telephone Encounter (Signed)
Called patient to put him on Dr. Kyla Balzarine schedule for 11/10/19. He would like patient to have a 48 hour holter monitor, echo, and will need CBC, ESR, CRP, TSH, and T4. Will place order for the above.

## 2019-11-08 NOTE — Progress Notes (Signed)
CARDIOLOGY CONSULT NOTE       Patient ID: Brad Ray MRN: [id number] DOB/AGE: 1960/05/01 59 y.o.  Referring Physician: Jenny Reichmann Primary Physician: Biagio Borg, MD Primary Cardiologist: New Reason for Consultation: Tachycardia  Active Problems:   * No active hospital problems. *   HPI:  59 y.o. 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 His son Dannielle Burn and my son played ball together. History of OSA and COVID infection August 2021 Did receive monoclonal antibody infusion. He is non smoker. History of HLD, HTN, GERD and severe vertigo from head injury during career He is compliant with CPAP. Not vaccinated  Sees [facility] CXR 10/28/19 with moderate bilateral heterogeneous patchy lung opacities typical of COVID lung infection Has f/u CT ordered for 3 months   Had cardiac CTA 04/16/19 with score of 70 which is 72 nd percentile for age / sex Calcium noted in distal LAD And mid RCA Started on crestor at that time   Since COVID diagnosis 10/12/19 noted higher HR;s than normal His son Dannielle Burn has had ablation attempts for SVT  I reviewed his CXR from 10/28/19 and he has extensive bilateral parenchymal involvement. I suspect his HR Is higher than normal due to this and ongoing long term lung effects  He was supposed to be on statin for high calcium score and he has not been taking this either   ROS All other systems reviewed and negative except as noted above  Past Medical History:  Diagnosis Date  . Abdominal pain, right upper quadrant 12/09/2007  . ALLERGIC RHINITIS 03/19/2007  . Allergy   . Coronary artery calcification seen on CT scan 04/19/2019  . DEGENERATIVE JOINT DISEASE, LUMBAR SPINE 12/23/2006  . [place] DISEASE, CERVICAL 12/23/2006  . FATIGUE 04/28/2009  . GERD 12/23/2006  . HYPERCHOLESTEROLEMIA 12/23/2006  . HYPERLIPIDEMIA 03/19/2007  . HYPERSOMNIA 04/28/2009  . HYPERTENSION 03/19/2007  . HYPOTENSION 04/13/2009  .  INSOMNIA, HX OF 12/23/2006  . LIVER MASS 12/11/2007  . MENIERE'S DISEASE 12/23/2006  . OTITIS MEDIA, LEFT 04/13/2009  . PERFORATION, TYMPANIC MEMBRANE NOS 12/23/2006    Family History  Problem Relation Age of Onset  . Diabetes Father   . Hypertension Father   . Heart disease Father   . Alcohol abuse Other   . Colon cancer Neg Hx   . Pancreatic cancer Neg Hx   . Stomach cancer Neg Hx   . Esophageal cancer Neg Hx   . Rectal cancer Neg Hx     Social History   Socioeconomic History  . Marital status: Married    Spouse name: Not on file  . Number of children: 1  . Years of education: Not on file  . Highest education level: Not on file  Occupational History  . Not on file  Tobacco Use  . Smoking status: Never Smoker  . Smokeless tobacco: Never Used  Vaping Use  . Vaping Use: Never used  Substance and Sexual Activity  . Alcohol use: Yes    Comment: weekly beer  . Drug use: No  . Sexual activity: Not on file  Other Topics Concern  . Not on file  Social History Narrative   1 child at [employer]   Social Determinants of Health   Financial Resource Strain:   . Difficulty of Paying Living Expenses: Not on file  Food Insecurity:   . Worried About Charity fundraiser in the Last Year: Not  on file  . Ran Out of Food in the Last Year: Not on file  Transportation Needs:   . Lack of Transportation (Medical): Not on file  . Lack of Transportation (Non-Medical): Not on file  Physical Activity:   . Days of Exercise per Week: Not on file  . Minutes of Exercise per Session: Not on file  Stress:   . Feeling of Stress : Not on file  Social Connections:   . Frequency of Communication with Friends and Family: Not on file  . Frequency of Social Gatherings with Friends and Family: Not on file  . Attends Religious Services: Not on file  . Active Member of Clubs or Organizations: Not on file  . Attends Archivist Meetings: Not on file  . Marital Status: Not on file  Intimate  Partner Violence:   . Fear of Current or Ex-Partner: Not on file  . Emotionally Abused: Not on file  . Physically Abused: Not on file  . Sexually Abused: Not on file    Past Surgical History:  Procedure Laterality Date  . left knee (MCL) repair    . s/p left ear surgury for vertigo    . s/p left knee medial collateral ligament damage        Current Outpatient Medications:  .  ARMOUR THYROID 90 MG tablet, Take 1 tablet by mouth daily. , Disp: , Rfl: 2 .  diazepam (VALIUM) 5 MG tablet, Take 1 tablet (5 mg total) by mouth 3 times/day as needed-between meals & bedtime., Disp: 30 tablet, Rfl: 0 .  metFORMIN (GLUCOPHAGE-XR) 500 MG 24 hr tablet, Take 1,000 mg by mouth 2 (two) times daily., Disp: , Rfl:  .  methylcellulose (CITRUCEL) oral powder, Take by mouth 2 (two) times daily., Disp: , Rfl:  .  Multiple Vitamin (MULTIVITAMIN) capsule, Take 1 capsule by mouth daily., Disp: , Rfl:  .  tadalafil (CIALIS) 20 MG tablet, TAKE 1 TABLET BY MOUTH ONCE DAILY AS NEEDED FOR ED, Disp: 30 tablet, Rfl: 2 .  telmisartan (MICARDIS) 80 MG tablet, TAKE 1 TABLET BY MOUTH EVERY DAY, Disp: 90 tablet, Rfl: 2 .  vitamin B-12 (CYANOCOBALAMIN) 500 MCG tablet, Take 500 mcg by mouth daily.  , Disp: , Rfl:  .  vitamin C (ASCORBIC ACID) 500 MG tablet, Take 1,000 mg by mouth daily.  , Disp: , Rfl:  .  VITAMIN D PO, Take by mouth daily., Disp: , Rfl:  .  zolpidem (AMBIEN) 10 MG tablet, TAKE 1 TABLET BY MOUTH EVERYDAY AT BEDTIME, Disp: 90 tablet, Rfl: 1  Current Facility-Administered Medications:  .  0.9 %  sodium chloride infusion, 500 mL, Intravenous, Once, Danis, Estill Cotta III, MD  . sodium chloride      Physical Exam: Blood pressure 100/80, pulse 78, height 6' 4"  (1.93 m), weight 233 lb (105.7 kg).    Affect appropriate Healthy:  appears stated age 88: normal Neck supple with no adenopathy JVP normal no bruits no thyromegaly Lungs clear with no wheezing and good diaphragmatic motion Heart:  S1/S2 no  murmur, no rub, gallop or click PMI normal Abdomen: benighn, BS positve, no tenderness, no AAA no bruit.  No HSM or HJR Distal pulses intact with no bruits No edema Neuro non-focal Skin warm and dry No muscular weakness   Labs:   Lab Results  Component Value Date   WBC 5.4 03/18/2019   HGB 16.0 03/18/2019   HCT 48.0 03/18/2019   MCV 92.5 03/18/2019   PLT 250.0 03/18/2019  No results for input(s): NA, K, CL, CO2, BUN, CREATININE, CALCIUM, PROT, BILITOT, ALKPHOS, ALT, AST, GLUCOSE in the last 168 hours.  Invalid input(s): LABALBU No results found for: CKTOTAL, CKMB, CKMBINDEX, TROPONINI  Lab Results  Component Value Date   CHOL 227 (H) 03/18/2019   CHOL 234 (H) 07/15/2017   CHOL 240 (H) 12/11/2016   Lab Results  Component Value Date   HDL 53.70 03/18/2019   HDL 44.60 07/15/2017   HDL 50.40 12/11/2016   Lab Results  Component Value Date   LDLCALC 155 (H) 12/11/2016   LDLCALC 130 (H) 10/25/2016   LDLCALC 153 (H) 08/03/2015   Lab Results  Component Value Date   TRIG 203.0 (H) 03/18/2019   TRIG 388.0 (H) 07/15/2017   TRIG 171.0 (H) 12/11/2016   Lab Results  Component Value Date   CHOLHDL 4 03/18/2019   CHOLHDL 5 07/15/2017   CHOLHDL 5 12/11/2016   Lab Results  Component Value Date   LDLDIRECT 131.0 03/18/2019   LDLDIRECT 133.0 07/15/2017   LDLDIRECT 173.7 03/17/2013      Radiology: DG Chest 2 View  Result Date: 10/28/2019 CLINICAL DATA:  COVID-19 10/12/2019. EXAM: CHEST - 2 VIEW COMPARISON:  Radiograph 01/20/2018 FINDINGS: Bilateral heterogeneous patchy lung opacities in a peripheral predominant distribution. Heart is normal in size with normal mediastinal contours. No pleural effusion or pneumothorax. No acute osseous abnormalities are seen. IMPRESSION: Bilateral heterogeneous patchy lung opacities in a pattern typical of COVID-19 pneumonia. Parenchymal involvement is moderate. Electronically Signed   By: Keith Rake M.D.   On: 10/28/2019 01:15     EKG: 03/25/19 SR normal ECG    ASSESSMENT AND PLAN:   1. COVID:  F/u pulmonary with CT 3 months CXR with moderate persistent parenchymal involvment will check ESR/CRP if still elevated ? Role for steroids   2. Tachycardia:  Likely related to COVID see above 48 hour monitor and echo ordered as well as TSH/T4  ECG is normal today   3. CAD: no chest pain high calcium score for age 54/2021 called statin back in   4. HTN:  On micardis normal   5. HLD:  Resume crestor f/u labs 3 months   6. Thyroid:  On [place] Thyroid labs today   Echo Labs 48 hour holter   Signed: Jenkins Rouge 11/10/2019, 11:40 AM

## 2019-11-08 NOTE — Telephone Encounter (Signed)
Called and spoke with Patient.  Patient stated his HR had been running 95-120 bpm, since he had covid.  Patient stated O2 sats had been 96-97%. Patient stated he is scheduled to follow up with cardiologist 11/10/19.  Patient stated he wanted Dr Ander Slade to be updated.

## 2019-11-09 IMAGING — DX DG RIBS 2V*L*
4 series · 4 of 4 positions shown · non-contrast
Comparison: CT scan of the chest September 04, 2017

CLINICAL DATA: One week history of left lower ribcage discomfort
with no known injury.

EXAM:
LEFT RIBS - 2 VIEW

[rib pa (1 of 2)]
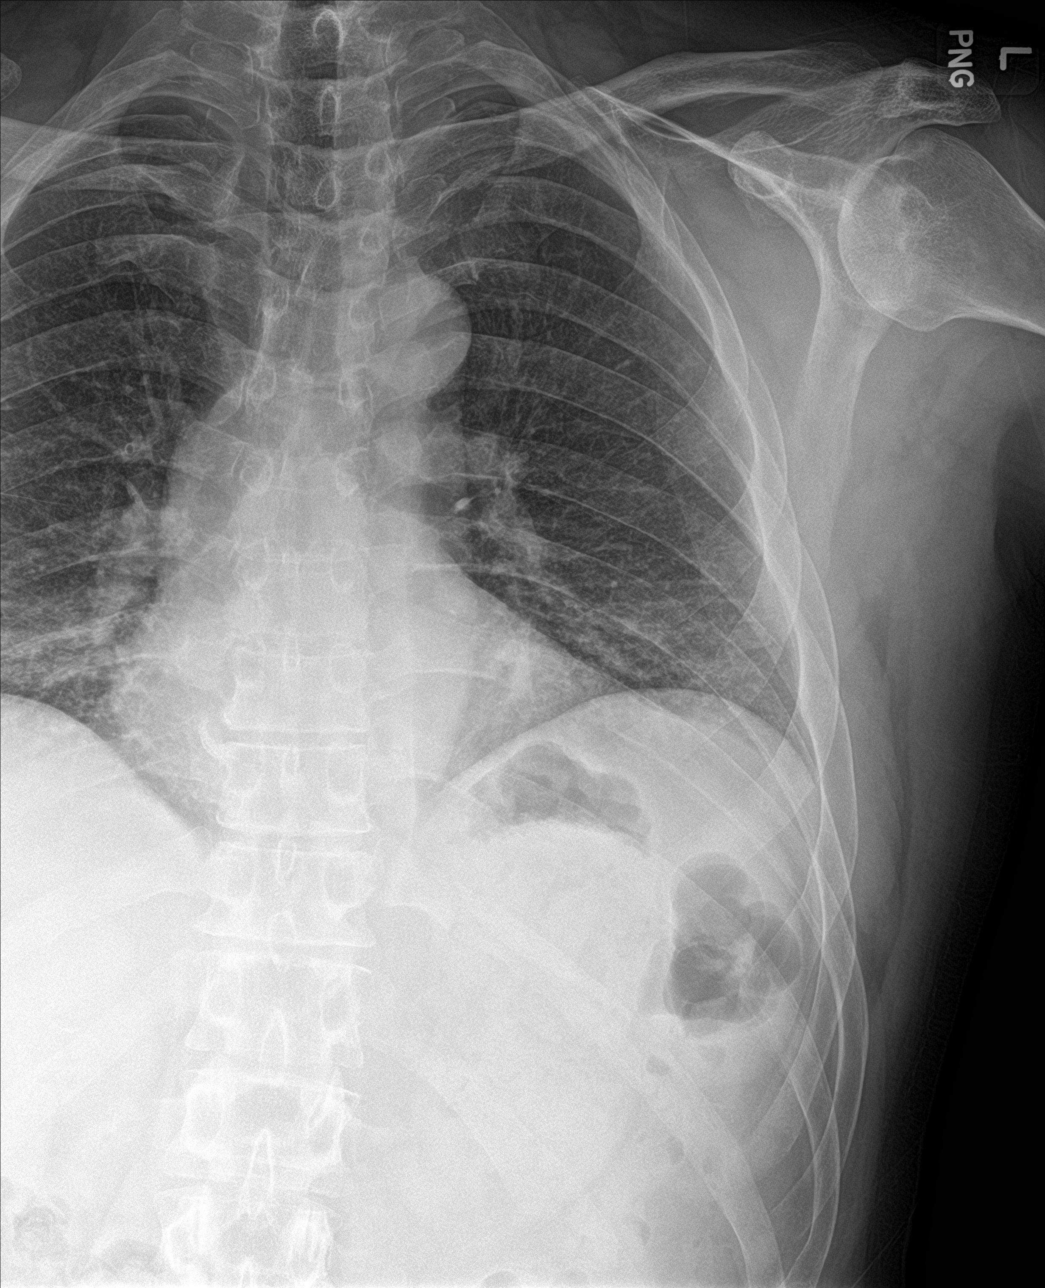

[rib pa (2 of 2)]
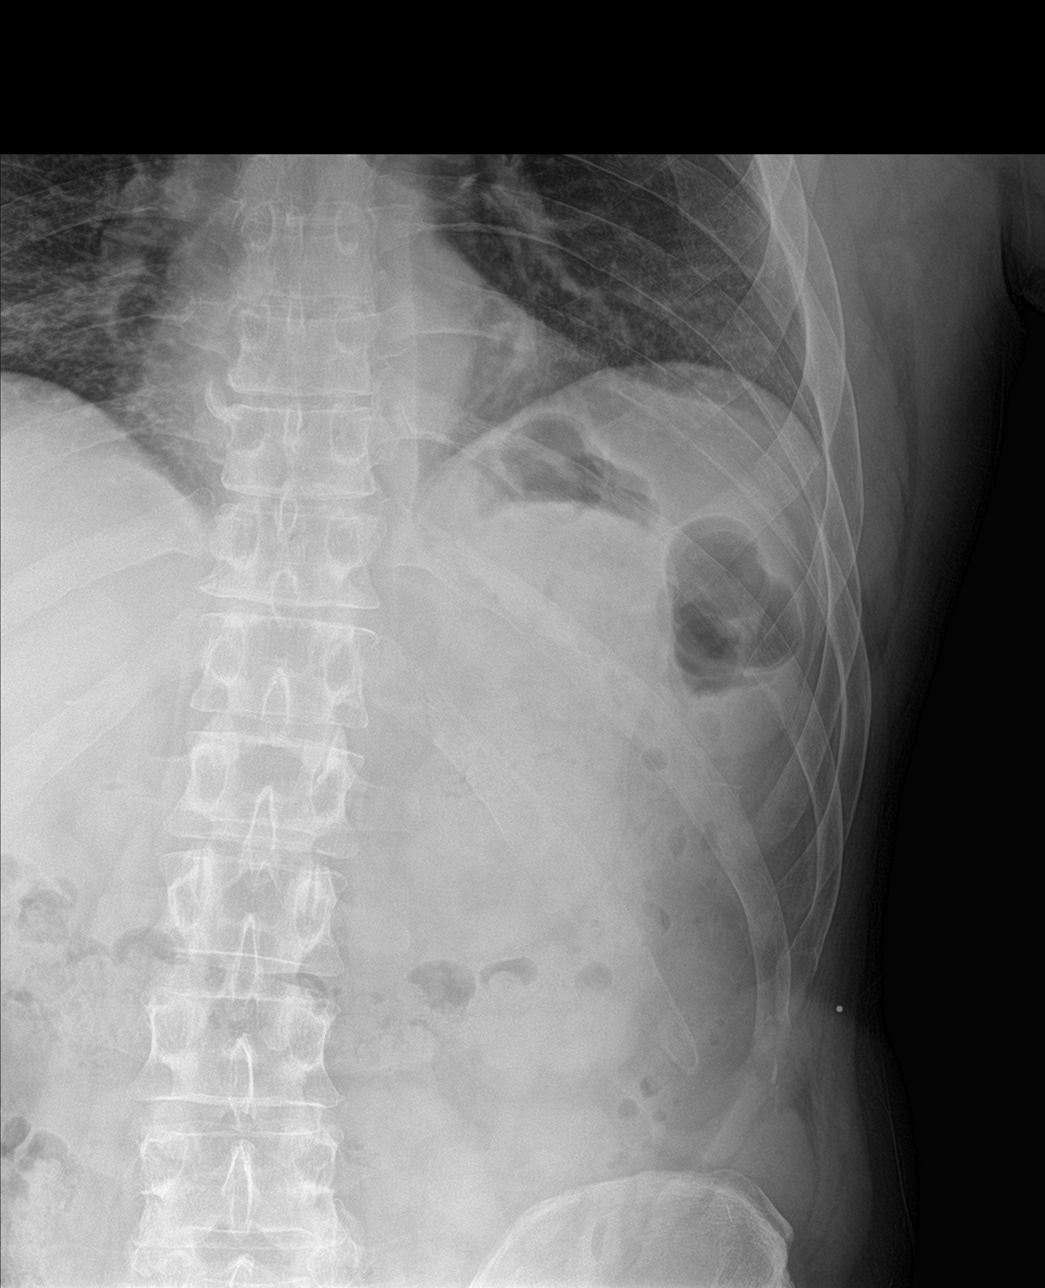

[rib obl (1 of 2)]
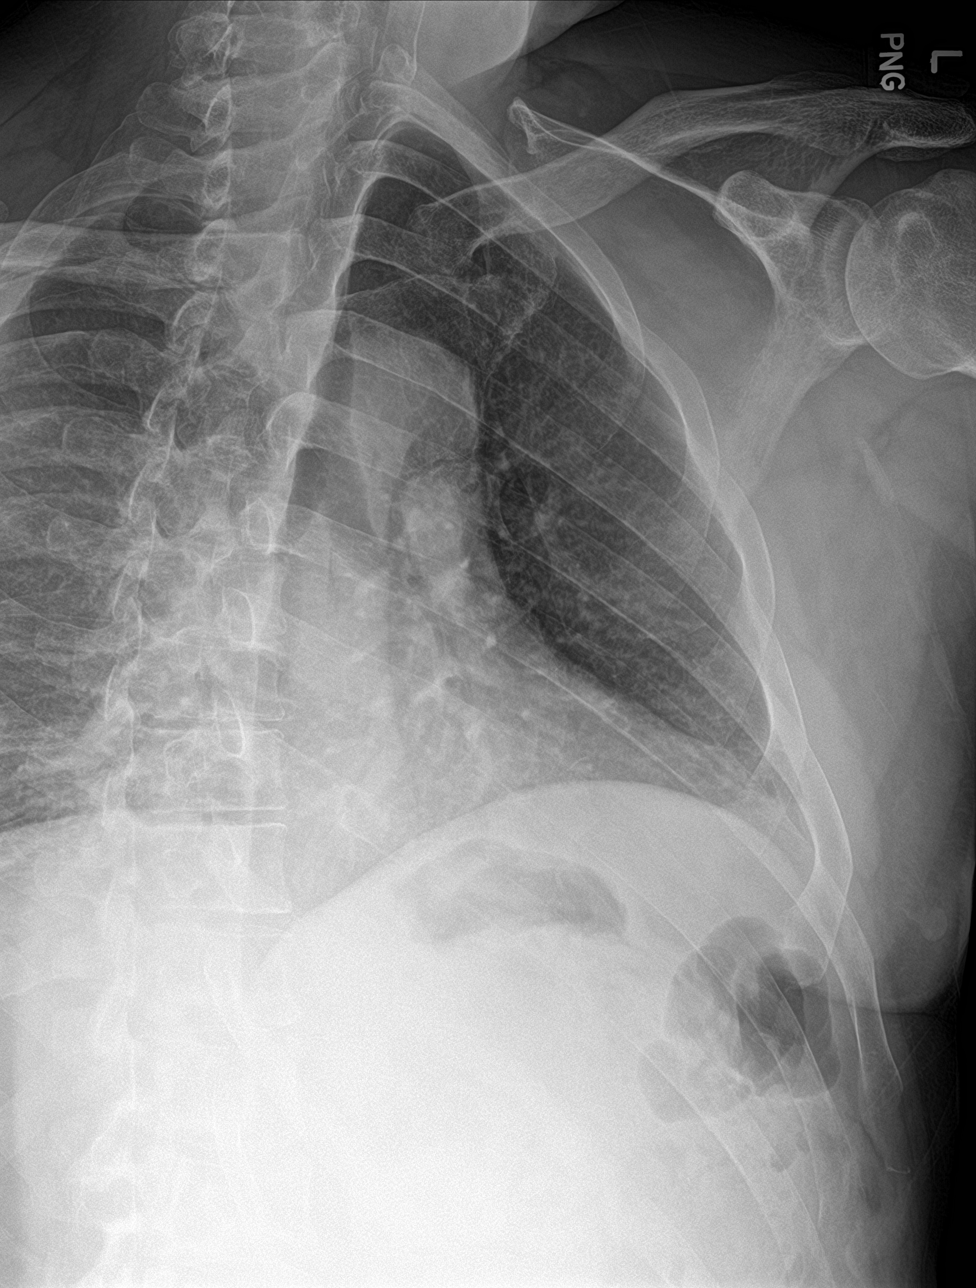

[rib obl (2 of 2)]
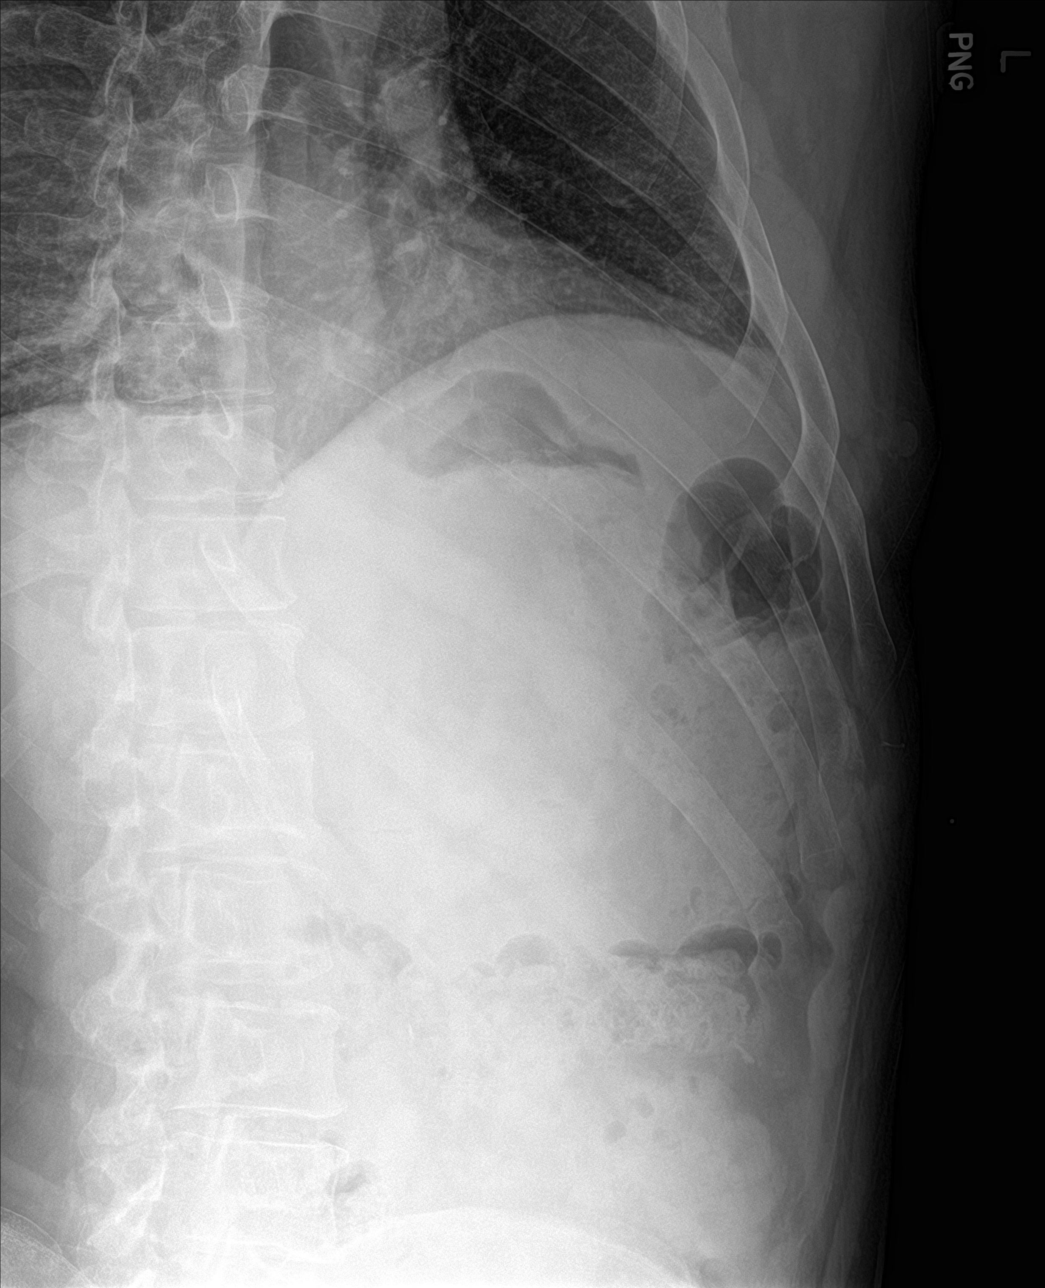

[4 of 4 positions shown; findings below may reference images not displayed]

FINDINGS: There is hazy increased density within the base of the left lung.
There is no pleural effusion or pneumothorax. The observed left ribs
are adequately mineralized. No acute left rib fracture is observed.
Specific attention to the area deep to the metallic BB inferior
laterally reveals no acute bony abnormality.
IMPRESSION: Coarse interstitial lung markings at the left base may reflect
subsegmental atelectasis or interstitial pneumonia. No pleural
effusion, pneumothorax, nor evidence of acute left lower rib
abnormality.

## 2019-11-10 ENCOUNTER — Encounter: Payer: Self-pay | Admitting: Cardiovascular Disease

## 2019-11-10 ENCOUNTER — Other Ambulatory Visit: Payer: BC Managed Care – PPO | Admitting: *Deleted

## 2019-11-10 ENCOUNTER — Other Ambulatory Visit: Payer: Self-pay

## 2019-11-10 ENCOUNTER — Ambulatory Visit (INDEPENDENT_AMBULATORY_CARE_PROVIDER_SITE_OTHER): Payer: BC Managed Care – PPO | Admitting: Cardiovascular Disease

## 2019-11-10 VITALS — BP 100/80 | HR 78 | Ht 76.0 in | Wt 233.0 lb

## 2019-11-10 DIAGNOSIS — R000 Tachycardia, unspecified: Secondary | ICD-10-CM | POA: Diagnosis not present

## 2019-11-10 DIAGNOSIS — E785 Hyperlipidemia, unspecified: Secondary | ICD-10-CM | POA: Diagnosis not present

## 2019-11-10 DIAGNOSIS — I10 Essential (primary) hypertension: Secondary | ICD-10-CM | POA: Diagnosis not present

## 2019-11-10 DIAGNOSIS — I2510 Atherosclerotic heart disease of native coronary artery without angina pectoris: Secondary | ICD-10-CM

## 2019-11-10 IMAGING — CT CT ABD-PELV W/ CM
2 of 5 series · 16 of 46 positions shown, 18 images · IV contrast (ISOVUE 300)
Comparison: December 16, 2007

CLINICAL DATA: Left upper quadrant abdomen pain for 1 week

EXAM:
CT ABDOMEN AND PELVIS WITH CONTRAST
TECHNIQUE: Multidetector CT imaging of the abdomen and pelvis was performed
using the standard protocol following bolus administration of
intravenous contrast.
CONTRAST:  100mL [id number]-HXX IOPAMIDOL (A6HE03-HXX) INJECTION 61%

[Series 2: abd/pel w · axial · 0.83mm/px · z∈[-537,-97]mm · 13 of 100 slices shown, 15 images]
[im 6/100  soft-tissue]
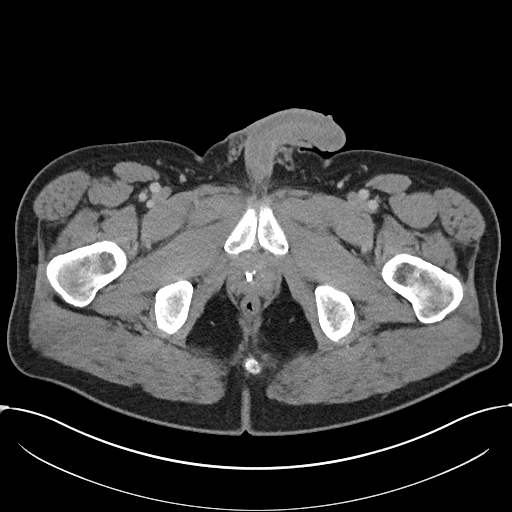
[im 6/100  bone]
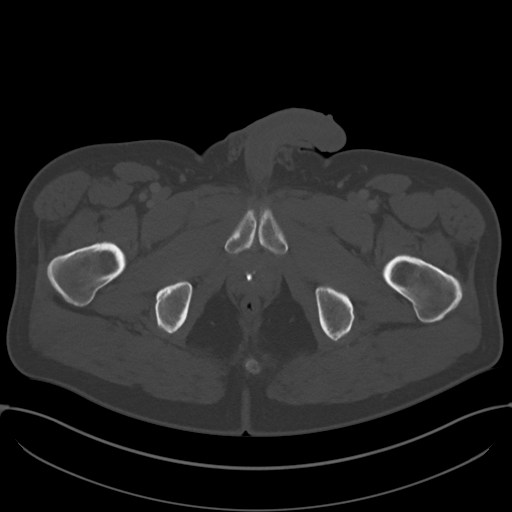
[im 16/100  soft-tissue]
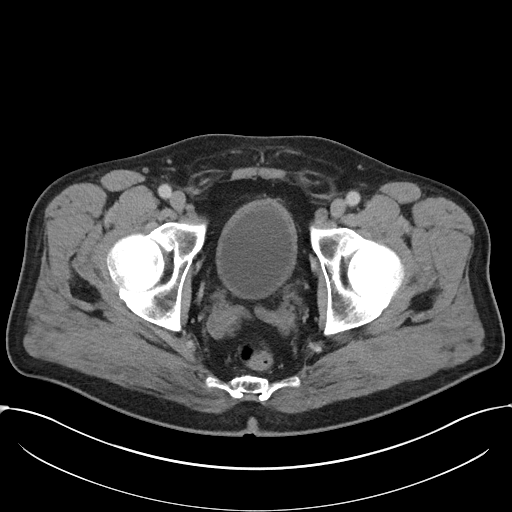
[im 21/100  soft-tissue]
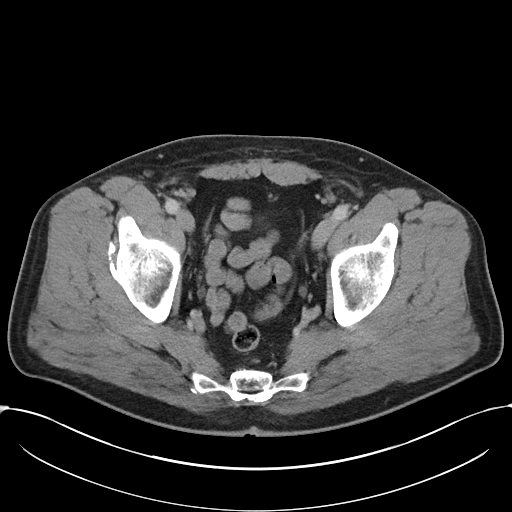
[im 27/100  soft-tissue]
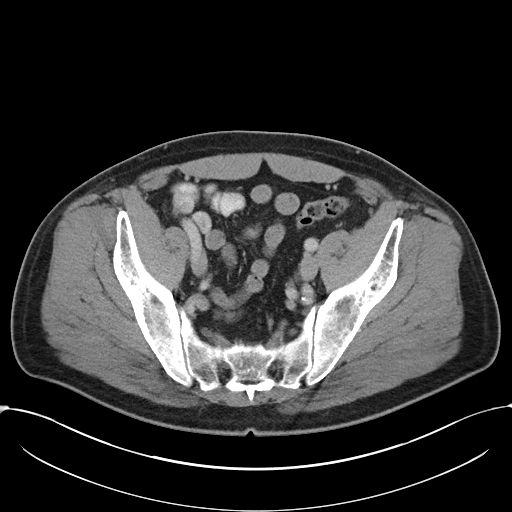
[im 37/100  soft-tissue]
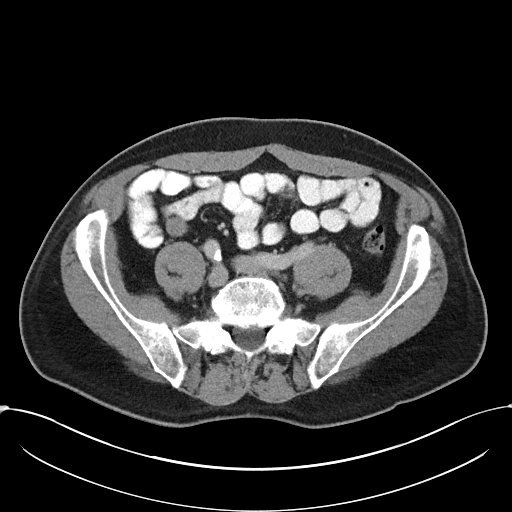
[im 42/100  soft-tissue]
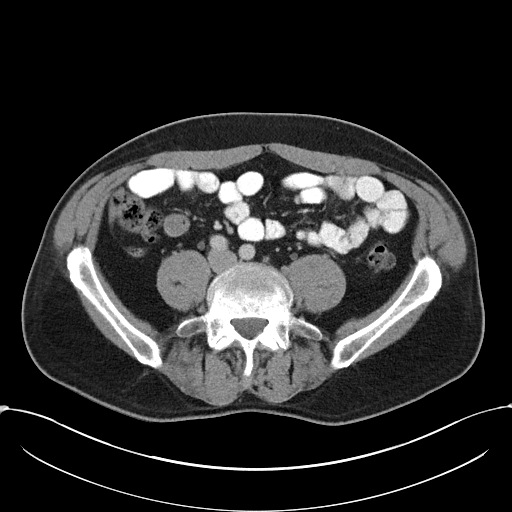
[im 53/100  soft-tissue]
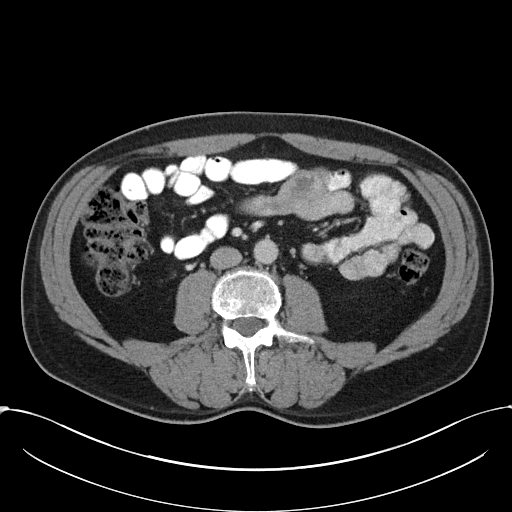
[im 58/100  soft-tissue]
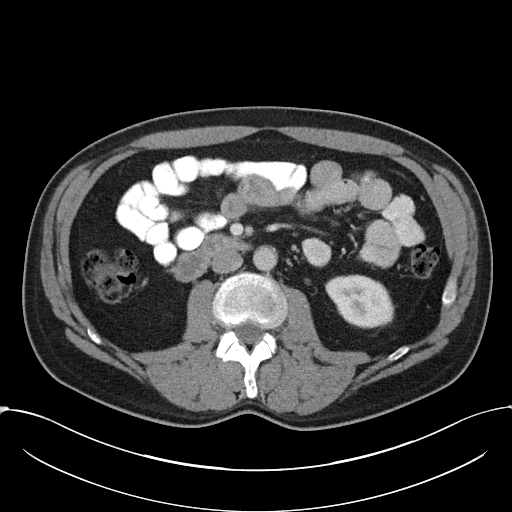
[im 63/100  soft-tissue]
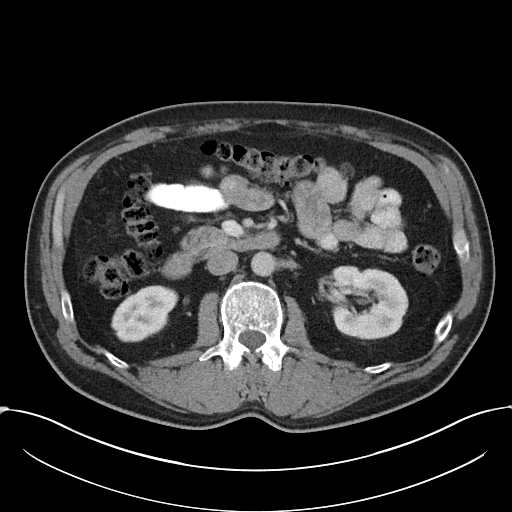
[im 63/100  bone]
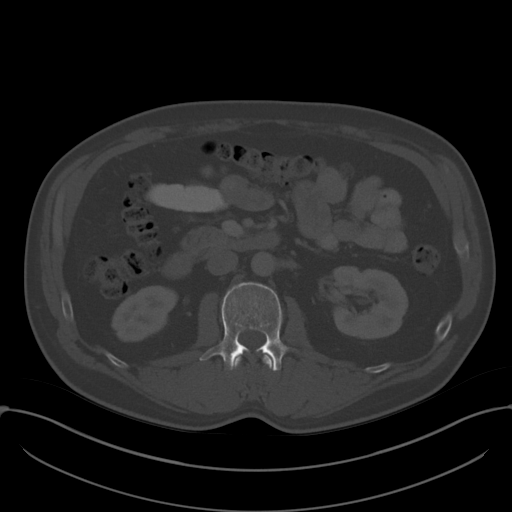
[im 73/100  soft-tissue]
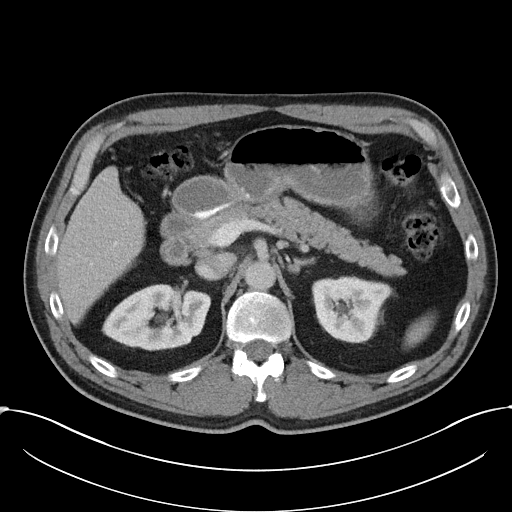
[im 79/100  soft-tissue]
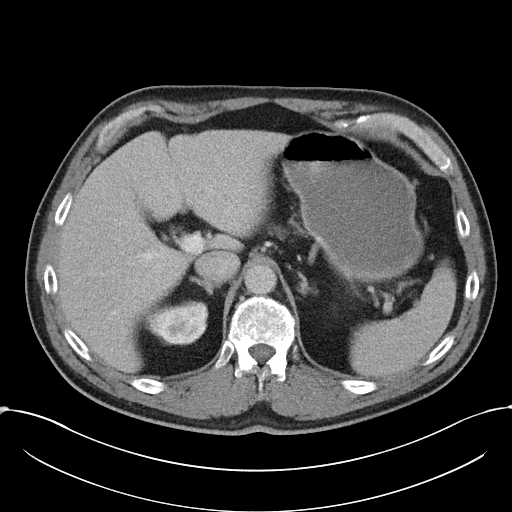
[im 84/100  soft-tissue]
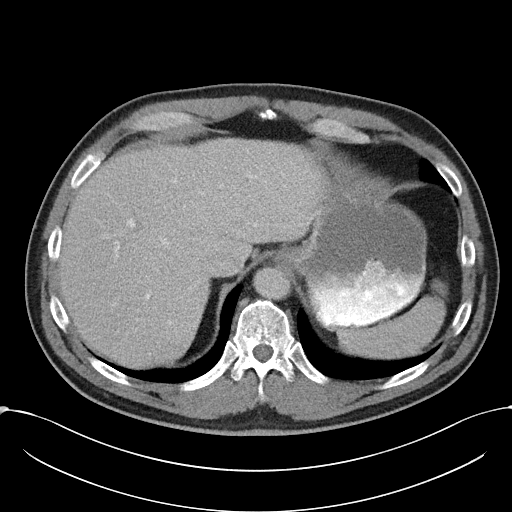
[im 94/100  soft-tissue]
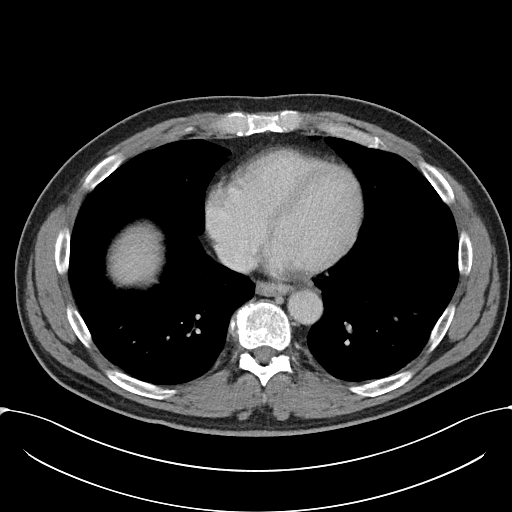

[Series 5: abd/pel w st · coronal · 0.79mm/px · 3 of 101 slices shown]
[im 34/101  soft-tissue]
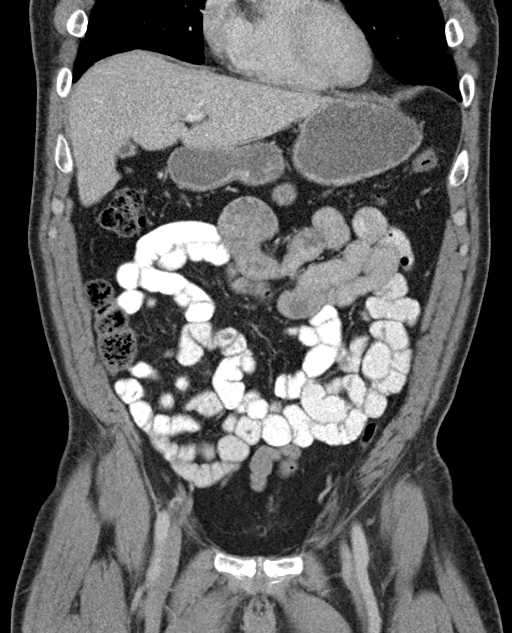
[im 45/101  soft-tissue]
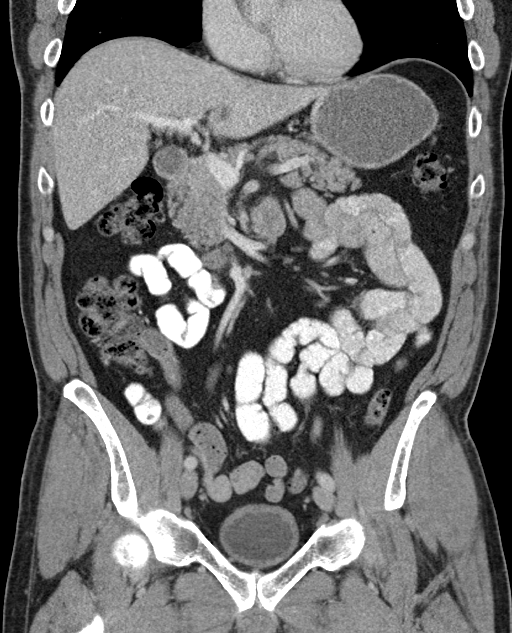
[im 56/101  soft-tissue]
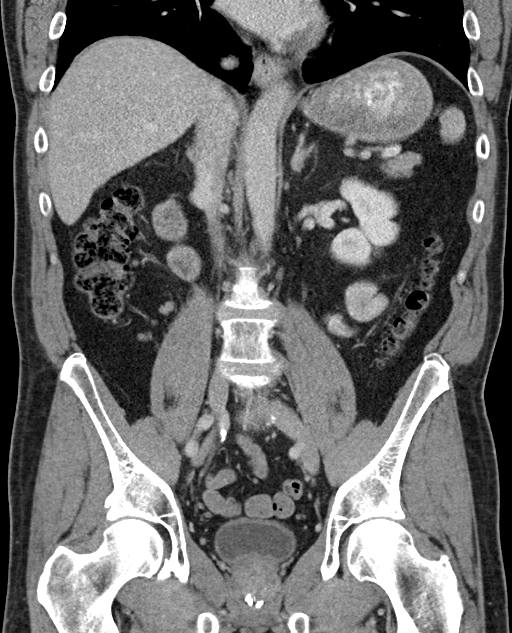

[16 of 46 positions shown; findings below may reference images not displayed]

FINDINGS: Lower chest: Patchy opacities identified in the anterior left lung
base developing pneumonia is not excluded given clinical symptoms.

Hepatobiliary: There is low density of the liver near the falciform
ligament probably due to focal fatty infiltration. There is a subtle
low-density in the right lobe liver unchanged compared to prior CT
scan probably a cyst. The gallbladder is decompressed without
abnormality. The biliary tree is normal.

Pancreas: Unremarkable. No pancreatic ductal dilatation or
surrounding inflammatory changes.

Spleen: Normal in size without focal abnormality.

Adrenals/Urinary Tract: Bilateral adrenal glands are normal. There
is a nonobstructing stone in the upper pole right kidney. There is
no hydronephrosis bilaterally. The bladder is partially decompressed
without abnormality.

Stomach/Bowel: Minimal hiatal hernia is noted. Stomach is within
normal limits. Appendix appears normal. No evidence of bowel wall
thickening, distention, or inflammatory changes.

Vascular/Lymphatic: Aortic atherosclerosis. No enlarged abdominal or
pelvic lymph nodes.

Reproductive: Prostate is unremarkable.

Other: There is small left inguinal herniation of mesenteric fat.

Musculoskeletal: Degenerative joint changes of the spine are noted.
IMPRESSION: No acute abnormality identified in the abdomen and pelvis. No bowel
obstruction or hydronephrosis bilaterally.

Patchy opacity of the anterior left lung base, developing pneumonia
is not excluded.

## 2019-11-10 MED ORDER — ROSUVASTATIN CALCIUM 5 MG PO TABS
5.0000 mg | ORAL_TABLET | Freq: Every day | ORAL | 3 refills | Status: DC
Start: 2019-11-10 — End: 2020-11-02

## 2019-11-10 NOTE — Patient Instructions (Addendum)
Medication Instructions:  *If you need a refill on your cardiac medications before your next appointment, please call your pharmacy*  Lab Work: If you have labs (blood work) drawn today and your tests are completely normal, you will receive your results only by: . MyChart Message (if you have MyChart) OR . A paper copy in the mail If you have any lab test that is abnormal or we need to change your treatment, we will call you to review the results.  Testing/Procedures: None ordered today.  Follow-Up: At [facility], you and your health needs are our priority.  As part of our continuing mission to provide you with exceptional heart care, we have created designated Provider Care Teams.  These Care Teams include your primary Cardiologist (physician) and Advanced Practice Providers (APPs -  Physician Assistants and Nurse Practitioners) who all work together to provide you with the care you need, when you need it.  We recommend signing up for the patient portal called "MyChart".  Sign up information is provided on this After Visit Summary.  MyChart is used to connect with patients for Virtual Visits (Telemedicine).  Patients are able to view lab/test results, encounter notes, upcoming appointments, etc.  Non-urgent messages can be sent to your provider as well.   To learn more about what you can do with MyChart, go to [email or web address].    Your next appointment:   3 month(s)  The format for your next appointment:   In Person  Provider:   You may see Dr. Nishan or one of the following Advanced Practice Providers on your designated Care Team:    Lori Gerhardt, NP  Laura Ingold, NP  Jill McDaniel, NP     

## 2019-11-11 ENCOUNTER — Other Ambulatory Visit (HOSPITAL_COMMUNITY): Payer: BC Managed Care – PPO

## 2019-11-11 ENCOUNTER — Ambulatory Visit (HOSPITAL_COMMUNITY): Payer: BC Managed Care – PPO | Attending: Cardiology

## 2019-11-11 DIAGNOSIS — R000 Tachycardia, unspecified: Secondary | ICD-10-CM | POA: Insufficient documentation

## 2019-11-11 LAB — CBC WITH DIFFERENTIAL/PLATELET
Basophils Absolute: 0 10*3/uL (ref 0.0–0.2)
Basos: 1 %
EOS (ABSOLUTE): 0.1 10*3/uL (ref 0.0–0.4)
Eos: 2 %
Hematocrit: 42.1 % (ref 37.5–51.0)
Hemoglobin: 14.4 g/dL (ref 13.0–17.7)
Immature Grans (Abs): 0 10*3/uL (ref 0.0–0.1)
Immature Granulocytes: 0 %
Lymphocytes Absolute: 1.5 10*3/uL (ref 0.7–3.1)
Lymphs: 35 %
MCH: 30.9 pg (ref 26.6–33.0)
MCHC: 34.2 g/dL (ref 31.5–35.7)
MCV: 90 fL (ref 79–97)
Monocytes Absolute: 0.5 10*3/uL (ref 0.1–0.9)
Monocytes: 12 %
Neutrophils Absolute: 2.1 10*3/uL (ref 1.4–7.0)
Neutrophils: 50 %
Platelets: 255 10*3/uL (ref 150–450)
RBC: 4.66 x10E6/uL (ref 4.14–5.80)
RDW: 13.2 % (ref 11.6–15.4)
WBC: 4.2 10*3/uL (ref 3.4–10.8)

## 2019-11-11 LAB — ECHOCARDIOGRAM COMPLETE
Area-P 1/2: 3.65 cm2
S' Lateral: 3.2 cm

## 2019-11-11 LAB — TSH: TSH: 0.291 u[IU]/mL — ABNORMAL LOW (ref 0.450–4.500)

## 2019-11-11 LAB — C-REACTIVE PROTEIN: CRP: 1 mg/L (ref 0–10)

## 2019-11-11 LAB — T4, FREE: Free T4: 0.99 ng/dL (ref 0.82–1.77)

## 2019-11-11 LAB — SEDIMENTATION RATE: Sed Rate: 29 mm/hr (ref 0–30)

## 2019-11-11 MED ORDER — PERFLUTREN LIPID MICROSPHERE
1.0000 mL | INTRAVENOUS | Status: AC | PRN
  Administered 2019-11-11: 1 mL via INTRAVENOUS

## 2019-11-17 ENCOUNTER — Encounter (INDEPENDENT_AMBULATORY_CARE_PROVIDER_SITE_OTHER): Payer: Self-pay

## 2019-11-17 NOTE — Progress Notes (Unsigned)
On 09/02/2019 I faxed a RX refill request for Diazepam 5 mg  #90, w/2 refills to [facility] on [address]. Take 1 q8 prn for dizziness.

## 2019-11-29 ENCOUNTER — Other Ambulatory Visit (HOSPITAL_COMMUNITY): Payer: BC Managed Care – PPO

## 2019-12-01 DIAGNOSIS — Z87820 Personal history of traumatic brain injury: Secondary | ICD-10-CM | POA: Diagnosis not present

## 2019-12-01 DIAGNOSIS — M4802 Spinal stenosis, cervical region: Secondary | ICD-10-CM | POA: Diagnosis not present

## 2019-12-01 DIAGNOSIS — G4733 Obstructive sleep apnea (adult) (pediatric): Secondary | ICD-10-CM | POA: Diagnosis not present

## 2019-12-01 DIAGNOSIS — M5412 Radiculopathy, cervical region: Secondary | ICD-10-CM | POA: Diagnosis not present

## 2019-12-01 DIAGNOSIS — Z0000 Encounter for general adult medical examination without abnormal findings: Secondary | ICD-10-CM | POA: Diagnosis not present

## 2019-12-01 DIAGNOSIS — Y9361 Activity, american tackle football: Secondary | ICD-10-CM | POA: Diagnosis not present

## 2019-12-01 DIAGNOSIS — G319 Degenerative disease of nervous system, unspecified: Secondary | ICD-10-CM | POA: Diagnosis not present

## 2019-12-02 ENCOUNTER — Ambulatory Visit: Payer: BC Managed Care – PPO | Admitting: Cardiology

## 2019-12-02 DIAGNOSIS — M5412 Radiculopathy, cervical region: Secondary | ICD-10-CM | POA: Diagnosis not present

## 2019-12-02 DIAGNOSIS — Y9361 Activity, american tackle football: Secondary | ICD-10-CM | POA: Diagnosis not present

## 2019-12-02 DIAGNOSIS — Z820 Family history of epilepsy and other diseases of the nervous system: Secondary | ICD-10-CM | POA: Diagnosis not present

## 2019-12-02 DIAGNOSIS — G4733 Obstructive sleep apnea (adult) (pediatric): Secondary | ICD-10-CM | POA: Diagnosis not present

## 2019-12-02 DIAGNOSIS — Z6829 Body mass index (BMI) 29.0-29.9, adult: Secondary | ICD-10-CM | POA: Diagnosis not present

## 2019-12-02 DIAGNOSIS — Z0000 Encounter for general adult medical examination without abnormal findings: Secondary | ICD-10-CM | POA: Diagnosis not present

## 2019-12-02 DIAGNOSIS — Z87820 Personal history of traumatic brain injury: Secondary | ICD-10-CM | POA: Diagnosis not present

## 2019-12-03 DIAGNOSIS — Z8616 Personal history of COVID-19: Secondary | ICD-10-CM | POA: Diagnosis not present

## 2019-12-03 DIAGNOSIS — I2510 Atherosclerotic heart disease of native coronary artery without angina pectoris: Secondary | ICD-10-CM | POA: Diagnosis not present

## 2019-12-03 DIAGNOSIS — R768 Other specified abnormal immunological findings in serum: Secondary | ICD-10-CM | POA: Insufficient documentation

## 2020-01-11 ENCOUNTER — Ambulatory Visit (INDEPENDENT_AMBULATORY_CARE_PROVIDER_SITE_OTHER)
Admission: RE | Admit: 2020-01-11 | Discharge: 2020-01-11 | Disposition: A | Discharge status: Home / Self Care | Payer: BC Managed Care – PPO | Source: Ambulatory Visit | Attending: Pulmonary Disease | Admitting: Pulmonary Disease

## 2020-01-11 ENCOUNTER — Other Ambulatory Visit: Payer: Self-pay

## 2020-01-11 DIAGNOSIS — J479 Bronchiectasis, uncomplicated: Secondary | ICD-10-CM | POA: Diagnosis not present

## 2020-01-11 DIAGNOSIS — Z8701 Personal history of pneumonia (recurrent): Secondary | ICD-10-CM | POA: Diagnosis not present

## 2020-01-11 DIAGNOSIS — I2510 Atherosclerotic heart disease of native coronary artery without angina pectoris: Secondary | ICD-10-CM | POA: Diagnosis not present

## 2020-01-11 DIAGNOSIS — U071 COVID-19: Secondary | ICD-10-CM

## 2020-01-11 DIAGNOSIS — I700 Atherosclerosis of aorta: Secondary | ICD-10-CM | POA: Diagnosis not present

## 2020-01-13 ENCOUNTER — Telehealth: Payer: Self-pay | Admitting: Pulmonary Disease

## 2020-01-14 ENCOUNTER — Other Ambulatory Visit: Payer: Self-pay | Admitting: Internal Medicine

## 2020-01-14 NOTE — Telephone Encounter (Signed)
Please refill as per office routine med refill policy (all routine meds refilled for 3 mo or monthly per pt preference up to one year from last visit, then month to month grace period for 3 mo, then further med refills will have to be denied)  

## 2020-01-21 NOTE — Telephone Encounter (Signed)
Brad Ray  01/14/2020 12:04 PM EDT     I called and spoke with patient regarding HRCT. Patient verbalized understanding and nothing further was needed.   Nothing further needed at this time.

## 2020-02-23 NOTE — Progress Notes (Signed)
CARDIOLOGY CONSULT NOTE       Patient ID: Brad Ray MRN: [id number] DOB/AGE: 1960/11/16 59 y.o.  Referring Physician: Jenny Reichmann Primary Physician: Biagio Borg, MD Primary Cardiologist: New Reason for Consultation: Tachycardia  Active Problems:   * No active hospital problems. *   HPI: 59 y.o. first seen at request of Dr Jenny Reichmann 11/10/19 for tachycardia. History of OSA and bad COVID infection August 2021 Received monoclonal antibody and f/U CT fortunately 01/11/20 showed good resolution of multi lobar pneumonia. 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 Cardiac CTA 04/16/19 with calcium score 70 which was 72 nd percentile for age and sex. He had been non compliant with statin and restarted He was on armor thyroid and T4 normal with suppressed TSH told to f/u with Dr Jenny Reichmann regarding dose   He is taking crestor. Has covid antibodies on blood work does not want to get vaccine until these wane  ROS All other systems reviewed and negative except as noted above  Past Medical History:  Diagnosis Date  . Abdominal pain, right upper quadrant 12/09/2007  . ALLERGIC RHINITIS 03/19/2007  . Allergy   . Coronary artery calcification seen on CT scan 04/19/2019  . DEGENERATIVE JOINT DISEASE, LUMBAR SPINE 12/23/2006  . [place] DISEASE, CERVICAL 12/23/2006  . FATIGUE 04/28/2009  . GERD 12/23/2006  . HYPERCHOLESTEROLEMIA 12/23/2006  . HYPERLIPIDEMIA 03/19/2007  . HYPERSOMNIA 04/28/2009  . HYPERTENSION 03/19/2007  . HYPOTENSION 04/13/2009  . INSOMNIA, HX OF 12/23/2006  . LIVER MASS 12/11/2007  . MENIERE'S DISEASE 12/23/2006  . OTITIS MEDIA, LEFT 04/13/2009  . PERFORATION, TYMPANIC MEMBRANE NOS 12/23/2006    Family History  Problem Relation Age of Onset  . Diabetes Father   . Hypertension Father   . Heart disease Father   . Alcohol abuse Other   . Colon cancer Neg Hx   . Pancreatic cancer Neg Hx   . Stomach cancer Neg Hx    . Esophageal cancer Neg Hx   . Rectal cancer Neg Hx     Social History   Socioeconomic History  . Marital status: Married    Spouse name: Not on file  . Number of children: 1  . Years of education: Not on file  . Highest education level: Not on file  Occupational History  . Not on file  Tobacco Use  . Smoking status: Never Smoker  . Smokeless tobacco: Never Used  Vaping Use  . Vaping Use: Never used  Substance and Sexual Activity  . Alcohol use: Yes    Comment: weekly beer  . Drug use: No  . Sexual activity: Not on file  Other Topics Concern  . Not on file  Social History Narrative   1 child at [employer]   Social Determinants of Health   Financial Resource Strain: Not on file  Food Insecurity: Not on file  Transportation Needs: Not on file  Physical Activity: Not on file  Stress: Not on file  Social Connections: Not on file  Intimate Partner Violence: Not on file    Past Surgical History:  Procedure Laterality Date  . left knee (MCL) repair    . s/p left ear surgury for vertigo    . s/p left knee medial collateral ligament damage        Current Outpatient Medications:  .  ARMOUR THYROID 90 MG tablet, Take 1 tablet by mouth daily. , Disp: ,  Rfl: 2 .  diazepam (VALIUM) 5 MG tablet, Take 1 tablet (5 mg total) by mouth 3 times/day as needed-between meals & bedtime., Disp: 30 tablet, Rfl: 0 .  metFORMIN (GLUCOPHAGE-XR) 500 MG 24 hr tablet, Take 1,000 mg by mouth 2 (two) times daily., Disp: , Rfl:  .  methylcellulose oral powder, Take by mouth 2 (two) times daily., Disp: , Rfl:  .  Multiple Vitamin (MULTIVITAMIN) capsule, Take 1 capsule by mouth daily., Disp: , Rfl:  .  rosuvastatin (CRESTOR) 5 MG tablet, Take 1 tablet (5 mg total) by mouth daily., Disp: 90 tablet, Rfl: 3 .  tadalafil (CIALIS) 20 MG tablet, TAKE 1 TABLET BY MOUTH ONCE DAILY AS NEEDED FOR ED, Disp: 30 tablet, Rfl: 2 .  telmisartan (MICARDIS) 80 MG tablet, TAKE 1 TABLET BY MOUTH EVERY DAY, Disp: 90  tablet, Rfl: 2 .  vitamin B-12 (CYANOCOBALAMIN) 500 MCG tablet, Take 500 mcg by mouth daily., Disp: , Rfl:  .  vitamin C (ASCORBIC ACID) 500 MG tablet, Take 1,000 mg by mouth daily., Disp: , Rfl:  .  VITAMIN D PO, Take by mouth daily., Disp: , Rfl:  .  zolpidem (AMBIEN) 10 MG tablet, TAKE 1 TABLET BY MOUTH EVERYDAY AT BEDTIME, Disp: 90 tablet, Rfl: 1  Current Facility-Administered Medications:  .  0.9 %  sodium chloride infusion, 500 mL, Intravenous, Once, Danis, Estill Cotta III, MD  . sodium chloride      Physical Exam: Blood pressure 114/78, pulse 74, height 6\' 4"  (1.93 m), weight 106.9 kg, SpO2 95 %.    Affect appropriate Healthy:  appears stated age 14: normal Neck supple with no adenopathy JVP normal no bruits no thyromegaly Lungs clear with no wheezing and good diaphragmatic motion Heart:  S1/S2 no murmur, no rub, gallop or click PMI normal Abdomen: benighn, BS positve, no tenderness, no AAA no bruit.  No HSM or HJR Distal pulses intact with no bruits No edema Neuro non-focal Skin warm and dry No muscular weakness   Labs:   Lab Results  Component Value Date   WBC 4.2 11/10/2019   HGB 14.4 11/10/2019   HCT 42.1 11/10/2019   MCV 90 11/10/2019   PLT 255 11/10/2019   No results for input(s): NA, K, CL, CO2, BUN, CREATININE, CALCIUM, PROT, BILITOT, ALKPHOS, ALT, AST, GLUCOSE in the last 168 hours.  Invalid input(s): LABALBU No results found for: CKTOTAL, CKMB, CKMBINDEX, TROPONINI  Lab Results  Component Value Date   CHOL 227 (H) 03/18/2019   CHOL 234 (H) 07/15/2017   CHOL 240 (H) 12/11/2016   Lab Results  Component Value Date   HDL 53.70 03/18/2019   HDL 44.60 07/15/2017   HDL 50.40 12/11/2016   Lab Results  Component Value Date   LDLCALC 155 (H) 12/11/2016   LDLCALC 130 (H) 10/25/2016   LDLCALC 153 (H) 08/03/2015   Lab Results  Component Value Date   TRIG 203.0 (H) 03/18/2019   TRIG 388.0 (H) 07/15/2017   TRIG 171.0 (H) 12/11/2016   Lab Results   Component Value Date   CHOLHDL 4 03/18/2019   CHOLHDL 5 07/15/2017   CHOLHDL 5 12/11/2016   Lab Results  Component Value Date   LDLDIRECT 131.0 03/18/2019   LDLDIRECT 133.0 07/15/2017   LDLDIRECT 173.7 03/17/2013      Radiology: No results found.  EKG: 03/25/19 SR normal ECG    ASSESSMENT AND PLAN:   1. COVID:  Improved CT 01/11/20 with resolution of interstitial changes  Sed Rate and C  reactive protein normal November 10 2019   2. Tachycardia:  Improved ? Related to anxiety and COVID ECG normal 48 hr monitor never done will defer at this point   3. CAD: no chest pain high calcium score for age 33 which is 46 nd percentile for age and sex  04/2019 ASA/Statin   4. HTN:  On micardis normal   5. HLD: on crestor needs f/u labs   6. Thyroid:  On Armour Thyroid see above dosing per primary   7. DM:  Discussed low carb diet.  Target hemoglobin A1c is 6.5 or less.  Continue current medications.   Lipid/Liver  COVID antibody test  F/U in a year    Signed: Jenkins Rouge 03/02/2020, 2:20 PM

## 2020-03-02 ENCOUNTER — Other Ambulatory Visit: Payer: Self-pay

## 2020-03-02 ENCOUNTER — Encounter: Payer: Self-pay | Admitting: Cardiovascular Disease

## 2020-03-02 ENCOUNTER — Ambulatory Visit (INDEPENDENT_AMBULATORY_CARE_PROVIDER_SITE_OTHER): Payer: BC Managed Care – PPO | Admitting: Cardiovascular Disease

## 2020-03-02 VITALS — BP 114/78 | HR 74 | Ht 76.0 in | Wt 235.6 lb

## 2020-03-02 DIAGNOSIS — E785 Hyperlipidemia, unspecified: Secondary | ICD-10-CM

## 2020-03-02 DIAGNOSIS — Z8616 Personal history of COVID-19: Secondary | ICD-10-CM | POA: Diagnosis not present

## 2020-03-02 NOTE — Addendum Note (Signed)
Addended by: Aris Georgia, Martie Muhlbauer L on: 03/02/2020 05:01 PM   Modules accepted: Orders

## 2020-03-02 NOTE — Patient Instructions (Addendum)
Medication Instructions:  *If you need a refill on your cardiac medications before your next appointment, please call your pharmacy*  Lab Work: If you have labs (blood work) drawn today and your tests are completely normal, you will receive your results only by: Marland Kitchen MyChart Message (if you have MyChart) OR . A paper copy in the mail If you have any lab test that is abnormal or we need to change your treatment, we will call you to review the results.  Testing/Procedures: Your physician recommends that you return for lab work to any [facility] for fasting lipid and liver panel and Covid antibody test.   Follow-Up: At [facility], you and your health needs are our priority.  As part of our continuing mission to provide you with exceptional heart care, we have created designated Provider Care Teams.  These Care Teams include your primary Cardiologist (physician) and Advanced Practice Providers (APPs -  Physician Assistants and Nurse Practitioners) who all work together to provide you with the care you need, when you need it.  We recommend signing up for the patient portal called "MyChart".  Sign up information is provided on this After Visit Summary.  MyChart is used to connect with patients for Virtual Visits (Telemedicine).  Patients are able to view lab/test results, encounter notes, upcoming appointments, etc.  Non-urgent messages can be sent to your provider as well.   To learn more about what you can do with MyChart, go to [email or web address].    Your next appointment:   12 month(s)  The format for your next appointment:   In Person  Provider:   You may see Dr. Johnsie Cancel or one of the following Advanced Practice Providers on your designated Care Team:    Truitt Merle, NP  Cecilie Kicks, NP  Kathyrn Drown, NP

## 2020-03-07 DIAGNOSIS — D519 Vitamin B12 deficiency anemia, unspecified: Secondary | ICD-10-CM | POA: Diagnosis not present

## 2020-03-07 DIAGNOSIS — E785 Hyperlipidemia, unspecified: Secondary | ICD-10-CM | POA: Diagnosis not present

## 2020-03-07 DIAGNOSIS — E291 Testicular hypofunction: Secondary | ICD-10-CM | POA: Diagnosis not present

## 2020-03-07 DIAGNOSIS — F5119 Other hypersomnia not due to a substance or known physiological condition: Secondary | ICD-10-CM | POA: Diagnosis not present

## 2020-03-07 DIAGNOSIS — E7800 Pure hypercholesterolemia, unspecified: Secondary | ICD-10-CM | POA: Diagnosis not present

## 2020-03-07 DIAGNOSIS — Z8616 Personal history of COVID-19: Secondary | ICD-10-CM | POA: Diagnosis not present

## 2020-03-07 DIAGNOSIS — R5383 Other fatigue: Secondary | ICD-10-CM | POA: Diagnosis not present

## 2020-03-07 DIAGNOSIS — R635 Abnormal weight gain: Secondary | ICD-10-CM | POA: Diagnosis not present

## 2020-03-08 LAB — HEPATIC FUNCTION PANEL
ALT: 30 IU/L (ref 0–44)
AST: 21 IU/L (ref 0–40)
Albumin: 4 g/dL (ref 3.8–4.9)
Alkaline Phosphatase: 76 IU/L (ref 44–121)
Bilirubin Total: 0.4 mg/dL (ref 0.0–1.2)
Bilirubin, Direct: 0.12 mg/dL (ref 0.00–0.40)
Total Protein: 6.9 g/dL (ref 6.0–8.5)

## 2020-03-08 LAB — LIPID PANEL
Chol/HDL Ratio: 3.2 ratio (ref 0.0–5.0)
Cholesterol, Total: 180 mg/dL (ref 100–199)
HDL: 57 mg/dL (ref >39)
LDL Chol Calc (NIH): 100 mg/dL — ABNORMAL HIGH (ref 0–99)
Triglycerides: 132 mg/dL (ref 0–149)
VLDL Cholesterol Cal: 23 mg/dL (ref 5–40)

## 2020-03-08 LAB — SARS-COV-2 SEMI-QUANTITATIVE TOTAL ANTIBODY, SPIKE
SARS-CoV-2 Semi-Quant Total Ab: 2360 U/mL (ref <0.8)
SARS-CoV-2 Spike Ab Interp: POSITIVE

## 2020-04-05 ENCOUNTER — Other Ambulatory Visit (INDEPENDENT_AMBULATORY_CARE_PROVIDER_SITE_OTHER): Payer: Self-pay | Admitting: Otolaryngology

## 2020-04-15 ENCOUNTER — Telehealth: Payer: Self-pay | Admitting: Internal Medicine

## 2020-04-19 ENCOUNTER — Other Ambulatory Visit (INDEPENDENT_AMBULATORY_CARE_PROVIDER_SITE_OTHER): Payer: Self-pay

## 2020-04-19 ENCOUNTER — Other Ambulatory Visit (INDEPENDENT_AMBULATORY_CARE_PROVIDER_SITE_OTHER): Payer: Self-pay | Admitting: Otolaryngology

## 2020-04-19 ENCOUNTER — Telehealth (INDEPENDENT_AMBULATORY_CARE_PROVIDER_SITE_OTHER): Payer: Self-pay | Admitting: Otolaryngology

## 2020-04-19 MED ORDER — DIAZEPAM 5 MG PO TABS
5.0000 mg | ORAL_TABLET | Freq: Two times a day (BID) | ORAL | 4 refills | Status: DC | PRN
Start: 2020-04-19 — End: 2021-11-19

## 2020-04-19 NOTE — Telephone Encounter (Signed)
Patient called about a refill of his Valium.  I refilled his Valium 60 mg tablets 1 tablet every 12 hours as needed dizziness #60 with 4 refills.

## 2020-05-01 ENCOUNTER — Other Ambulatory Visit: Payer: Self-pay | Admitting: Internal Medicine

## 2020-05-01 NOTE — Telephone Encounter (Signed)
Due to normal office refill policy   LOV was jan 4562

## 2020-05-01 NOTE — Telephone Encounter (Signed)
Patient wondering why it was only a 30 day supply states he usually gets a 90 day supply with a few refills

## 2020-05-03 ENCOUNTER — Telehealth: Payer: Self-pay

## 2020-05-03 NOTE — Telephone Encounter (Signed)
Unable to leave voicemail in regards to office policy on prescription refill

## 2020-05-11 ENCOUNTER — Ambulatory Visit (INDEPENDENT_AMBULATORY_CARE_PROVIDER_SITE_OTHER): Payer: BC Managed Care – PPO | Admitting: Internal Medicine

## 2020-05-11 ENCOUNTER — Other Ambulatory Visit: Payer: Self-pay

## 2020-05-11 ENCOUNTER — Encounter: Payer: Self-pay | Admitting: Internal Medicine

## 2020-05-11 VITALS — BP 126/78 | HR 73 | Ht 76.0 in | Wt 238.0 lb

## 2020-05-11 DIAGNOSIS — R739 Hyperglycemia, unspecified: Secondary | ICD-10-CM

## 2020-05-11 DIAGNOSIS — E7800 Pure hypercholesterolemia, unspecified: Secondary | ICD-10-CM | POA: Diagnosis not present

## 2020-05-11 DIAGNOSIS — F5101 Primary insomnia: Secondary | ICD-10-CM

## 2020-05-11 DIAGNOSIS — I10 Essential (primary) hypertension: Secondary | ICD-10-CM

## 2020-05-11 MED ORDER — ZOLPIDEM TARTRATE 10 MG PO TABS: ORAL_TABLET | ORAL | 1 refills | Status: DC

## 2020-05-11 MED ORDER — TELMISARTAN 80 MG PO TABS: 80.0000 mg | ORAL_TABLET | Freq: Every day | ORAL | 3 refills | Status: DC

## 2020-05-16 ENCOUNTER — Encounter: Payer: Self-pay | Admitting: Internal Medicine

## 2020-05-16 DIAGNOSIS — R739 Hyperglycemia, unspecified: Secondary | ICD-10-CM | POA: Insufficient documentation

## 2020-05-16 DIAGNOSIS — G4700 Insomnia, unspecified: Secondary | ICD-10-CM | POA: Insufficient documentation

## 2020-05-16 NOTE — Patient Instructions (Signed)
Please continue all other medications as before, and refills have been done if requested.  Please have the pharmacy call with any other refills you may need.  Please continue your efforts at being more active, low cholesterol diet, and weight control.  Please keep your appointments with your specialists as you may have planned     

## 2020-05-16 NOTE — Assessment & Plan Note (Signed)
Stable, for med refill,  to f/u any worsening symptoms or concerns 

## 2020-05-16 NOTE — Assessment & Plan Note (Signed)
Lab Results  Component Value Date   HGBA1C 5.3 08/03/2015   Stable, pt to continue current medical treatment metformin

## 2020-05-16 NOTE — Progress Notes (Signed)
Patient ID: Brad Ray, male   DOB: 1961-01-14, 60 y.o.   MRN: [id number]        Chief Complaint: follow up HLD and hyperglycemia, insomnia       HPI:  Brad Ray is a 60 y.o. male here overall doing ok, still with significant insomnia and asks for med refills; Pt denies chest pain, increased sob or doe, wheezing, orthopnea, PND, increased LE swelling, palpitations, dizziness or syncope.   Pt denies polydipsia, polyuria, Denies new worsening focal neuro s/s.   Pt denies fever, wt loss, night sweats, loss of appetite, or other constitutional symptoms  No other new complaints Wt Readings from Last 3 Encounters:  05/11/20 238 lb (108 kg)  03/02/20 235 lb 9.6 oz (106.9 kg)  11/10/19 233 lb (105.7 kg)   BP Readings from Last 3 Encounters:  05/11/20 126/78  03/02/20 114/78  11/10/19 100/80         Past Medical History:  Diagnosis Date  . Abdominal pain, right upper quadrant 12/09/2007  . ALLERGIC RHINITIS 03/19/2007  . Allergy   . Coronary artery calcification seen on CT scan 04/19/2019  . DEGENERATIVE JOINT DISEASE, LUMBAR SPINE 12/23/2006  . [place] DISEASE, CERVICAL 12/23/2006  . FATIGUE 04/28/2009  . GERD 12/23/2006  . HYPERCHOLESTEROLEMIA 12/23/2006  . HYPERLIPIDEMIA 03/19/2007  . HYPERSOMNIA 04/28/2009  . HYPERTENSION 03/19/2007  . HYPOTENSION 04/13/2009  . INSOMNIA, HX OF 12/23/2006  . LIVER MASS 12/11/2007  . MENIERE'S DISEASE 12/23/2006  . OTITIS MEDIA, LEFT 04/13/2009  . PERFORATION, TYMPANIC MEMBRANE NOS 12/23/2006   Past Surgical History:  Procedure Laterality Date  . left knee (MCL) repair    . s/p left ear surgury for vertigo    . s/p left knee medial collateral ligament damage      reports that he has never smoked. He has never used smokeless tobacco. He reports current alcohol use. He reports that he does not use drugs. family history includes Alcohol abuse in an other family member; Diabetes in his father; Heart disease in his father; Hypertension in his  father. Allergies  Allergen Reactions  . Penicillins     Childhood reaction   Current Outpatient Medications on File Prior to Visit  Medication Sig Dispense Refill  . ARMOUR THYROID 90 MG tablet Take 1 tablet by mouth daily.   2  . diazepam (VALIUM) 5 MG tablet Take 1 tablet (5 mg total) by mouth 3 times/day as needed-between meals & bedtime. 30 tablet 0  . diazepam (VALIUM) 5 MG tablet Take 1 tablet (5 mg total) by mouth every 12 (twelve) hours as needed for anxiety. 60 tablet 4  . metFORMIN (GLUCOPHAGE-XR) 500 MG 24 hr tablet Take 1,000 mg by mouth 2 (two) times daily.    . methylcellulose oral powder Take by mouth 2 (two) times daily.    . Multiple Vitamin (MULTIVITAMIN) capsule Take 1 capsule by mouth daily.    . rosuvastatin (CRESTOR) 5 MG tablet Take 1 tablet (5 mg total) by mouth daily. 90 tablet 3  . tadalafil (CIALIS) 20 MG tablet TAKE 1 TABLET BY MOUTH ONCE DAILY AS NEEDED FOR ERECTILE DYSFUNCTION 30 tablet 0  . vitamin B-12 (CYANOCOBALAMIN) 500 MCG tablet Take 500 mcg by mouth daily.    . vitamin C (ASCORBIC ACID) 500 MG tablet Take 1,000 mg by mouth daily.    Marland Kitchen VITAMIN D PO Take by mouth daily.     Current Facility-Administered Medications on File Prior to Visit  Medication Dose Route Frequency Provider  Last Rate Last Admin  . 0.9 %  sodium chloride infusion  500 mL Intravenous Once Doran Stabler, MD            ROS:  All others reviewed and negative.  Objective        PE:  BP 126/78   Pulse 73   Ht 6\' 4"  (1.93 m)   Wt 238 lb (108 kg)   SpO2 96%   BMI 28.97 kg/m                 Constitutional: Pt appears in NAD               HENT: Head: NCAT.                Right Ear: External ear normal.                 Left Ear: External ear normal.                Eyes: . Pupils are equal, round, and reactive to light. Conjunctivae and EOM are normal               Nose: without d/c or deformity               Neck: Neck supple. Gross normal ROM               Cardiovascular:  Normal rate and regular rhythm.                 Pulmonary/Chest: Effort normal and breath sounds without rales or wheezing.                Abd:  Soft, NT, ND, + BS, no organomegaly               Neurological: Pt is alert. At baseline orientation, motor grossly intact               Skin: Skin is warm. No rashes, no other new lesions, LE edema - none               Psychiatric: Pt behavior is normal without agitation   Micro: none  Cardiac tracings I have personally interpreted today:  none  Pertinent Radiological findings (summarize): none   Lab Results  Component Value Date   WBC 4.2 11/10/2019   HGB 14.4 11/10/2019   HCT 42.1 11/10/2019   PLT 255 11/10/2019   GLUCOSE 96 03/18/2019   CHOL 180 03/07/2020   TRIG 132 03/07/2020   HDL 57 03/07/2020   LDLDIRECT 131.0 03/18/2019   LDLCALC 100 (H) 03/07/2020   ALT 30 03/07/2020   AST 21 03/07/2020   NA 137 03/18/2019   K 4.5 03/18/2019   CL 102 03/18/2019   CREATININE 1.05 03/18/2019   BUN 14 03/18/2019   CO2 28 03/18/2019   TSH 0.291 (L) 11/10/2019   PSA 1.65 03/18/2019   HGBA1C 5.3 08/03/2015   MICROALBUR 1.3 08/03/2015   Assessment/Plan:  Brad Ray is a 60 y.o. White or Caucasian [1] male with  has a past medical history of Abdominal pain, right upper quadrant (12/09/2007), ALLERGIC RHINITIS (03/19/2007), Allergy, Coronary artery calcification seen on CT scan (04/19/2019), DEGENERATIVE JOINT DISEASE, LUMBAR SPINE (12/23/2006), [place] DISEASE, CERVICAL (12/23/2006), FATIGUE (04/28/2009), GERD (12/23/2006), HYPERCHOLESTEROLEMIA (12/23/2006), HYPERLIPIDEMIA (03/19/2007), HYPERSOMNIA (04/28/2009), HYPERTENSION (03/19/2007), HYPOTENSION (04/13/2009), INSOMNIA, HX OF (12/23/2006), LIVER MASS (12/11/2007), MENIERE'S DISEASE (12/23/2006), OTITIS MEDIA, LEFT (04/13/2009), and PERFORATION, TYMPANIC MEMBRANE NOS (12/23/2006).  Essential  hypertension BP Readings from Last 3 Encounters:  05/11/20 126/78  03/02/20 114/78  11/10/19 100/80   Stable, pt  to continue medical treatment - micardis   Hyperlipidemia Lab Results  Component Value Date   LDLCALC 100 (H) 03/07/2020   Stable, pt to continue current statin crestor 5   Insomnia Stable, for med refill,  to f/u any worsening symptoms or concerns  Hyperglycemia Lab Results  Component Value Date   HGBA1C 5.3 08/03/2015   Stable, pt to continue current medical treatment metformin   Followup: Return in about 1 year (around 05/11/2021).  Cathlean Cower, MD 05/16/2020 2:00 AM [facility]

## 2020-05-16 NOTE — Assessment & Plan Note (Signed)
Lab Results  Component Value Date   LDLCALC 100 (H) 03/07/2020   Stable, pt to continue current statin crestor 5

## 2020-05-16 NOTE — Assessment & Plan Note (Signed)
BP Readings from Last 3 Encounters:  05/11/20 126/78  03/02/20 114/78  11/10/19 100/80   Stable, pt to continue medical treatment - micardis

## 2020-06-14 DIAGNOSIS — R109 Unspecified abdominal pain: Secondary | ICD-10-CM | POA: Diagnosis not present

## 2020-06-14 DIAGNOSIS — R079 Chest pain, unspecified: Secondary | ICD-10-CM | POA: Diagnosis not present

## 2020-08-29 DIAGNOSIS — K091 Developmental (nonodontogenic) cysts of oral region: Secondary | ICD-10-CM | POA: Diagnosis not present

## 2020-10-18 DIAGNOSIS — G4733 Obstructive sleep apnea (adult) (pediatric): Secondary | ICD-10-CM | POA: Diagnosis not present

## 2020-10-23 ENCOUNTER — Other Ambulatory Visit (INDEPENDENT_AMBULATORY_CARE_PROVIDER_SITE_OTHER): Payer: BC Managed Care – PPO

## 2020-10-23 DIAGNOSIS — Z125 Encounter for screening for malignant neoplasm of prostate: Secondary | ICD-10-CM | POA: Diagnosis not present

## 2020-10-23 DIAGNOSIS — Z0000 Encounter for general adult medical examination without abnormal findings: Secondary | ICD-10-CM | POA: Diagnosis not present

## 2020-10-23 LAB — LIPID PANEL
Cholesterol: 176 mg/dL (ref 0–200)
HDL: 53 mg/dL (ref >39.00)
LDL Cholesterol: 99 mg/dL (ref 0–99)
NonHDL: 122.51
Total CHOL/HDL Ratio: 3
Triglycerides: 120 mg/dL (ref 0.0–149.0)
VLDL: 24 mg/dL (ref 0.0–40.0)

## 2020-10-23 LAB — HEPATIC FUNCTION PANEL
ALT: 17 U/L (ref 0–53)
AST: 15 U/L (ref 0–37)
Albumin: 4.2 g/dL (ref 3.5–5.2)
Alkaline Phosphatase: 69 U/L (ref 39–117)
Bilirubin, Direct: 0.1 mg/dL (ref 0.0–0.3)
Total Bilirubin: 0.7 mg/dL (ref 0.2–1.2)
Total Protein: 7.4 g/dL (ref 6.0–8.3)

## 2020-10-23 LAB — BASIC METABOLIC PANEL
BUN: 14 mg/dL (ref 6–23)
CO2: 25 mEq/L (ref 19–32)
Calcium: 9.3 mg/dL (ref 8.4–10.5)
Chloride: 103 mEq/L (ref 96–112)
Creatinine, Ser: 0.93 mg/dL (ref 0.40–1.50)
GFR: 90.75 mL/min (ref >60.00)
Glucose, Bld: 96 mg/dL (ref 70–99)
Potassium: 4.8 mEq/L (ref 3.5–5.1)
Sodium: 137 mEq/L (ref 135–145)

## 2020-10-23 LAB — CBC WITH DIFFERENTIAL/PLATELET
Basophils Absolute: 0 10*3/uL (ref 0.0–0.1)
Basophils Relative: 0.3 % (ref 0.0–3.0)
Eosinophils Absolute: 0.1 10*3/uL (ref 0.0–0.7)
Eosinophils Relative: 2 % (ref 0.0–5.0)
HCT: 47.3 % (ref 39.0–52.0)
Hemoglobin: 16 g/dL (ref 13.0–17.0)
Lymphocytes Relative: 29.3 % (ref 12.0–46.0)
Lymphs Abs: 1.8 10*3/uL (ref 0.7–4.0)
MCHC: 33.9 g/dL (ref 30.0–36.0)
MCV: 89.9 fl (ref 78.0–100.0)
Monocytes Absolute: 0.6 10*3/uL (ref 0.1–1.0)
Monocytes Relative: 9.4 % (ref 3.0–12.0)
Neutro Abs: 3.6 10*3/uL (ref 1.4–7.7)
Neutrophils Relative %: 59 % (ref 43.0–77.0)
Platelets: 288 10*3/uL (ref 150.0–400.0)
RBC: 5.26 Mil/uL (ref 4.22–5.81)
RDW: 13.6 % (ref 11.5–15.5)
WBC: 6.2 10*3/uL (ref 4.0–10.5)

## 2020-10-23 LAB — URINALYSIS, ROUTINE W REFLEX MICROSCOPIC
Bilirubin Urine: NEGATIVE
Hgb urine dipstick: NEGATIVE
Ketones, ur: NEGATIVE
Leukocytes,Ua: NEGATIVE
Nitrite: NEGATIVE
RBC / HPF: NONE SEEN (ref 0–?)
Specific Gravity, Urine: 1.025 (ref 1.000–1.030)
Total Protein, Urine: NEGATIVE
Urine Glucose: NEGATIVE
Urobilinogen, UA: 0.2 (ref 0.0–1.0)
WBC, UA: NONE SEEN (ref 0–?)
pH: 6 (ref 5.0–8.0)

## 2020-10-23 LAB — PSA: PSA: 2.27 ng/mL (ref 0.10–4.00)

## 2020-10-23 LAB — TSH: TSH: 2.47 u[IU]/mL (ref 0.35–5.50)

## 2020-10-26 ENCOUNTER — Encounter: Payer: BC Managed Care – PPO | Admitting: Internal Medicine

## 2020-10-28 ENCOUNTER — Other Ambulatory Visit (INDEPENDENT_AMBULATORY_CARE_PROVIDER_SITE_OTHER): Payer: Self-pay | Admitting: Otolaryngology

## 2020-11-02 ENCOUNTER — Other Ambulatory Visit: Payer: Self-pay | Admitting: Cardiovascular Disease

## 2020-11-02 ENCOUNTER — Ambulatory Visit (INDEPENDENT_AMBULATORY_CARE_PROVIDER_SITE_OTHER): Payer: BC Managed Care – PPO | Admitting: Internal Medicine

## 2020-11-02 ENCOUNTER — Telehealth (INDEPENDENT_AMBULATORY_CARE_PROVIDER_SITE_OTHER): Payer: Self-pay | Admitting: Otolaryngology

## 2020-11-02 ENCOUNTER — Other Ambulatory Visit: Payer: Self-pay

## 2020-11-02 ENCOUNTER — Other Ambulatory Visit (INDEPENDENT_AMBULATORY_CARE_PROVIDER_SITE_OTHER): Payer: Self-pay | Admitting: Otolaryngology

## 2020-11-02 VITALS — BP 130/90 | HR 75 | Temp 97.5°F | Ht 76.0 in | Wt 245.0 lb

## 2020-11-02 DIAGNOSIS — Z0001 Encounter for general adult medical examination with abnormal findings: Secondary | ICD-10-CM | POA: Diagnosis not present

## 2020-11-02 DIAGNOSIS — I10 Essential (primary) hypertension: Secondary | ICD-10-CM | POA: Diagnosis not present

## 2020-11-02 DIAGNOSIS — R739 Hyperglycemia, unspecified: Secondary | ICD-10-CM

## 2020-11-02 DIAGNOSIS — E538 Deficiency of other specified B group vitamins: Secondary | ICD-10-CM

## 2020-11-02 DIAGNOSIS — R972 Elevated prostate specific antigen [PSA]: Secondary | ICD-10-CM | POA: Diagnosis not present

## 2020-11-02 DIAGNOSIS — E669 Obesity, unspecified: Secondary | ICD-10-CM | POA: Diagnosis not present

## 2020-11-02 DIAGNOSIS — E559 Vitamin D deficiency, unspecified: Secondary | ICD-10-CM

## 2020-11-02 DIAGNOSIS — Z86010 Personal history of colonic polyps: Secondary | ICD-10-CM

## 2020-11-02 DIAGNOSIS — E7800 Pure hypercholesterolemia, unspecified: Secondary | ICD-10-CM

## 2020-11-02 MED ORDER — DIAZEPAM 5 MG PO TABS: 5.0000 mg | ORAL_TABLET | Freq: Three times a day (TID) | ORAL | 4 refills | Status: DC | PRN

## 2020-11-02 NOTE — Progress Notes (Signed)
Patient ID: Brad Ray, male   DOB: 1960-08-19, 60 y.o.   MRN: [id number]         Chief Complaint:: wellness exam and htn, hld, elevated psa.obesity       HPI:  Brad Ray is a 60 y.o. male here for wellness exam; due for colonoscopy, o/w up to date with preventive referrals and immunizations                        Also working on losing wt but metformin not helping and in fact has intemittent low sugar symptoms, so not sure if wants to continue; Pt denies chest pain, increased sob or doe, wheezing, orthopnea, PND, increased LE swelling, palpitations, dizziness or syncope.   Pt denies polydipsia, polyuria, or new focal neuro s/s.   Pt denies fever, wt loss, night sweats, loss of appetite, or other constitutional symptoms  Denies urinary symptoms such as dysuria, frequency, urgency, flank pain, hematuria or n/v, fever, chills.  BP at home < 140/90.  Trying to follow lower chol diet.   Wt Readings from Last 3 Encounters:  11/02/20 245 lb (111.1 kg)  05/11/20 238 lb (108 kg)  03/02/20 235 lb 9.6 oz (106.9 kg)   BP Readings from Last 3 Encounters:  11/02/20 130/90  05/11/20 126/78  03/02/20 114/78   Immunization History  Administered Date(s) Administered   Influenza Whole 01/11/1999   Influenza, Seasonal, Injecte, Preservative Fre 03/13/2012   Influenza,inj,Quad PF,6+ Mos 10/31/2016, 01/20/2018   Tdap 07/17/2017   Health Maintenance Due  Topic Date Due   COLONOSCOPY (Pts 45-48yr Insurance coverage will need to be confirmed)  01/16/2020      Past Medical History:  Diagnosis Date   Abdominal pain, right upper quadrant 12/09/2007   ALLERGIC RHINITIS 03/19/2007   Allergy    Coronary artery calcification seen on CT scan 04/19/2019   DEGENERATIVE JOINT DISEASE, LUMBAR SPINE 12/23/2006   [place] Pos[place]EASE, CERVICAL 12/23/2006   FATIGUE 04/28/2009   GERD 12/23/2006   HYPERCHOLESTEROLEMIA 12/23/2006   HYPERLIPIDEMIA 03/19/2007   HYPERSOMNIA 04/28/2009   HYPERTENSION 03/19/2007   HYPOTENSION  04/13/2009   INSOMNIA, HX OF 12/23/2006   LIVER MASS 12/11/2007   MENIERE'S DISEASE 12/23/2006   OTITIS MEDIA, LEFT 04/13/2009   PERFORATION, TYMPANIC MEMBRANE NOS 12/23/2006   Past Surgical History:  Procedure Laterality Date   left knee (MCL) repair     s/p left ear surgury for vertigo     s/p left knee medial collateral ligament damage      reports that he has never smoked. He has never used smokeless tobacco. He reports current alcohol use. He reports that he does not use drugs. family history includes Alcohol abuse in an other family member; Diabetes in his father; Heart disease in his father; Hypertension in his father. Allergies  Allergen Reactions   Penicillins     Childhood reaction   Current Outpatient Medications on File Prior to Visit  Medication Sig Dispense Refill   ARMOUR THYROID 90 MG tablet Take 1 tablet by mouth daily.   2   diazepam (VALIUM) 5 MG tablet Take 1 tablet (5 mg total) by mouth 3 times/day as needed-between meals & bedtime. 30 tablet 0   diazepam (VALIUM) 5 MG tablet Take 1 tablet (5 mg total) by mouth every 12 (twelve) hours as needed for anxiety. 60 tablet 4   methylcellulose oral powder Take by mouth 2 (two) times daily.     Multiple Vitamin (MULTIVITAMIN) capsule  Take 1 capsule by mouth daily.     tadalafil (CIALIS) 20 MG tablet TAKE 1 TABLET BY MOUTH ONCE DAILY AS NEEDED FOR ERECTILE DYSFUNCTION 30 tablet 0   telmisartan (MICARDIS) 80 MG tablet Take 1 tablet (80 mg total) by mouth daily. 90 tablet 3   vitamin B-12 (CYANOCOBALAMIN) 500 MCG tablet Take 500 mcg by mouth daily.     vitamin C (ASCORBIC ACID) 500 MG tablet Take 1,000 mg by mouth daily.     VITAMIN D PO Take by mouth daily.     zolpidem (AMBIEN) 10 MG tablet 1 tab by mouth at bedtime as needed 90 tablet 1   No current facility-administered medications on file prior to visit.        ROS:  All others reviewed and negative.  Objective        PE:  BP 130/90 (BP Location: Left Arm, Patient  Position: Sitting, Cuff Size: Normal)   Pulse 75   Temp (!) 97.5 F (36.4 C) (Oral)   Ht '6\' 4"'$  (1.93 m)   Wt 245 lb (111.1 kg)   SpO2 97%   BMI 29.82 kg/m                 Constitutional: Pt appears in NAD               HENT: Head: NCAT.                Right Ear: External ear normal.                 Left Ear: External ear normal.                Eyes: . Pupils are equal, round, and reactive to light. Conjunctivae and EOM are normal               Nose: without d/c or deformity               Neck: Neck supple. Gross normal ROM               Cardiovascular: Normal rate and regular rhythm.                 Pulmonary/Chest: Effort normal and breath sounds without rales or wheezing.                Abd:  Soft, NT, ND, + BS, no organomegaly               Neurological: Pt is alert. At baseline orientation, motor grossly intact               Skin: Skin is warm. No rashes, no other new lesions, LE edema - none               Psychiatric: Pt behavior is normal without agitation   Micro: none  Cardiac tracings I have personally interpreted today:  none  Pertinent Radiological findings (summarize): none   Lab Results  Component Value Date   WBC 6.2 10/23/2020   HGB 16.0 10/23/2020   HCT 47.3 10/23/2020   PLT 288.0 10/23/2020   GLUCOSE 96 10/23/2020   CHOL 176 10/23/2020   TRIG 120.0 10/23/2020   HDL 53.00 10/23/2020   LDLDIRECT 131.0 03/18/2019   LDLCALC 99 10/23/2020   ALT 17 10/23/2020   AST 15 10/23/2020   NA 137 10/23/2020   K 4.8 10/23/2020   CL 103 10/23/2020   CREATININE 0.93 10/23/2020   BUN 14 10/23/2020  CO2 25 10/23/2020   TSH 2.47 10/23/2020   PSA 2.27 10/23/2020   HGBA1C 5.3 08/03/2015   MICROALBUR 1.3 08/03/2015   Assessment/Plan:  Brad Ray is a 60 y.o. White or Caucasian [1] male with  has a past medical history of Abdominal pain, right upper quadrant (12/09/2007), ALLERGIC RHINITIS (03/19/2007), Allergy, Coronary artery calcification seen on CT scan  (04/19/2019), DEGENERATIVE JOINT DISEASE, LUMBAR SPINE (12/23/2006), DISC DISEASE, CERVICAL (12/23/2006), FATIGUE (04/28/2009), GERD (12/23/2006), HYPERCHOLESTEROLEMIA (12/23/2006), HYPERLIPIDEMIA (03/19/2007), HYPERSOMNIA (04/28/2009), HYPERTENSION (03/19/2007), HYPOTENSION (04/13/2009), INSOMNIA, HX OF (12/23/2006), LIVER MASS (12/11/2007), MENIERE'S DISEASE (12/23/2006), OTITIS MEDIA, LEFT (04/13/2009), and PERFORATION, TYMPANIC MEMBRANE NOS (12/23/2006).  Encounter for well adult exam with abnormal findings Age and sex appropriate education and counseling updated with regular exercise and diet Referrals for preventative services - due for colonoscopy Immunizations addressed - none needed Smoking counseling  - none needed Evidence for depression or other mood disorder - none significant Most recent labs reviewed. I have personally reviewed and have noted: 1) the patient's medical and social history 2) The patient's current medications and supplements 3) The patient's height, weight, and BMI have been recorded in the chart   Essential hypertension BP Readings from Last 3 Encounters:  11/02/20 130/90  05/11/20 126/78  03/02/20 114/78   Stable, pt to continue medical treatment micardis   Hyperlipidemia Lab Results  Component Value Date   [place] 99 10/23/2020   Uncontrolled, goal ldl < 70,  pt to continue current low chol diet and crestor 5, declines increased statin or lipid clinic referral, for f/u lipid lab in 3 mo   Increased prostate specific antigen (PSA) velocity Somewhat suspicious,, for f/u psa in 3 mo, consider urology referral  Obesity Ok to d/c metformin for significant lack of efficacy and likely recurring low sugar symtpoms  History of colon polyps For colonoscopy as is due  Followup: Return in about 1 year (around 11/02/2021).  Cathlean Cower, MD 11/05/2020 5:47 PM [facility]

## 2020-11-02 NOTE — Telephone Encounter (Signed)
Brad Ray called concerning refills for his Valium 5 mg tabs that he takes once a day.  He wanted to be able to get refills for the next year if possible. I prescribed Valium 5 mg tablets 1 every 8 hours as needed.  Gave him 60 with 4 refills.

## 2020-11-02 NOTE — Patient Instructions (Signed)
Ok to stop the metformin  Please call if you change your mind about increased crestor, or referral to the [facility] for [place]  Please continue all other medications as before, and refills have been done if requested.  Please have the pharmacy call with any other refills you may need.  Please continue your efforts at being more active, low cholesterol diet, and weight control.  You are otherwise up to date with prevention measures today.  Please keep your appointments with your specialists as you may have planned  Please plan to check your PSA and Lipids at the [facility] as you like in 3 to 6 months  Please make an Appointment to return for your 1 year visit, or sooner if needed, with Lab testing by Appointment as well, to be done about 3-5 days before at the [facility] (so this is for TWO appointments - please see the scheduling desk as you leave)  Due to the ongoing Covid 19 pandemic, our lab now requires an appointment for any labs done at our office.  If you need labs done and do not have an appointment, please call our office ahead of time to schedule before presenting to the lab for your testing.

## 2020-11-05 ENCOUNTER — Encounter: Payer: Self-pay | Admitting: Internal Medicine

## 2020-11-05 DIAGNOSIS — Z86010 Personal history of colonic polyps: Secondary | ICD-10-CM | POA: Insufficient documentation

## 2020-11-05 DIAGNOSIS — E669 Obesity, unspecified: Secondary | ICD-10-CM | POA: Insufficient documentation

## 2020-11-05 NOTE — Assessment & Plan Note (Signed)
BP Readings from Last 3 Encounters:  11/02/20 130/90  05/11/20 126/78  03/02/20 114/78   Stable, pt to continue medical treatment micardis

## 2020-11-05 NOTE — Assessment & Plan Note (Signed)
Ok to d/c metformin for significant lack of efficacy and likely recurring low sugar symtpoms

## 2020-11-05 NOTE — Assessment & Plan Note (Signed)
For colonoscopy as is due 

## 2020-11-05 NOTE — Assessment & Plan Note (Signed)
Age and sex appropriate education and counseling updated with regular exercise and diet Referrals for preventative services - due for colonoscopy Immunizations addressed - none needed Smoking counseling  - none needed Evidence for depression or other mood disorder - none significant Most recent labs reviewed. I have personally reviewed and have noted: 1) the patient's medical and social history 2) The patient's current medications and supplements 3) The patient's height, weight, and BMI have been recorded in the chart

## 2020-11-05 NOTE — Assessment & Plan Note (Signed)
Somewhat suspicious,, for f/u psa in 3 mo, consider urology referral

## 2020-11-05 NOTE — Assessment & Plan Note (Addendum)
Lab Results  Component Value Date   LDLCALC 99 10/23/2020   Uncontrolled, goal ldl < 70,  pt to continue current low chol diet and crestor 5, declines increased statin or lipid clinic referral, for f/u lipid lab in 3 mo

## 2020-11-08 ENCOUNTER — Other Ambulatory Visit: Payer: Self-pay | Admitting: Internal Medicine

## 2020-11-14 ENCOUNTER — Telehealth (INDEPENDENT_AMBULATORY_CARE_PROVIDER_SITE_OTHER): Payer: BC Managed Care – PPO | Admitting: Family Medicine

## 2020-11-14 DIAGNOSIS — J029 Acute pharyngitis, unspecified: Secondary | ICD-10-CM

## 2020-11-14 DIAGNOSIS — R52 Pain, unspecified: Secondary | ICD-10-CM | POA: Diagnosis not present

## 2020-11-14 DIAGNOSIS — R509 Fever, unspecified: Secondary | ICD-10-CM | POA: Diagnosis not present

## 2020-11-14 DIAGNOSIS — R059 Cough, unspecified: Secondary | ICD-10-CM | POA: Diagnosis not present

## 2020-11-14 NOTE — Patient Instructions (Addendum)
Monitor your O2 and if running below 95 please seek inperson medical care right away.  If fevers go back up please seek inperson medical care.   Would do another covid test today either a PCR or a careful home swab for a full 15 seconds on each nostril this afternoon. IF positive and you wish to pursue treatment you may contact a [facility] pharmacy or schedule a virtual visit through [facility] or [facility].   -can use tylenol or aleve if needed for fevers, aches and pains per instructions.  You may wish to hold off on these today to see how your fever is doing.    -can use nasal saline a few times per day if you have nasal congestion, gargling warm salt water can be helpful for the throat at times.  -stay hydrated, drink plenty of fluids and eat small healthy meals - avoid dairy  -can take 1000 IU (61mg) Vit D3 and 100-500 mg of Vit C daily per instructions  -If the Covid test is positive, check out the [facility] for more information on home care, transmission and treatment for COVID19  -follow up with your doctor in 2-3 days unless improving and feeling better  -stay home while sick, except to seek medical care. If you have COVID19, ideally it would be best to stay home for a full 10 days since the onset of symptoms PLUS one day of no fever and feeling better. Wear a good mask that fits snugly (such as N95 or KN95) if around others to reduce the risk of transmission.  It was nice to meet you today, and I really hope you are feeling better soon. I help [facility] out with telemedicine visits on Tuesdays and Thursdays and am available for visits on those days. If you have any concerns or questions following this visit please schedule a follow up visit with your Primary Care doctor or seek care at a local urgent care clinic to avoid delays in care.    Seek in person care or schedule a follow up video visit promptly if your symptoms worsen, new concerns arise or you are not improving with  treatment. Call 911 and/or seek emergency care if your symptoms are severe or life threatening.

## 2020-11-14 NOTE — Progress Notes (Signed)
Virtual Visit via Video Note  I connected with Dino  on 11/14/20 at 10:20 AM EDT by a video enabled telemedicine application and verified that I am speaking with the correct person using two identifiers.  Location patient: home, [place] Location provider:work or home office Persons participating in the virtual visit: patient, provider  I discussed the limitations of evaluation and management by telemedicine and the availability of in person appointments. The patient expressed understanding and agreed to proceed.   HPI:  Acute telemedicine visit for flu like symptoms: -Onset: 3-4 days ago  -did 4 covid tests which were negative -Symptoms include:fever, sore throat, chills, shivers, cough, body aches, diarrhea, nausea -Denies: CP, SOB, vomiting, inability to eat/drink/get out of bed -O2 had been running a little low around 94, but is better today around 96 and temp has improved to 99 -Pertinent past medical history: see below -Pertinent medication allergies: Allergies  Allergen Reactions   Penicillins     Childhood reaction  -COVID-19 vaccine status: none, has had covid twice  ROS: See pertinent positives and negatives per HPI.  Past Medical History:  Diagnosis Date   Abdominal pain, right upper quadrant 12/09/2007   ALLERGIC RHINITIS 03/19/2007   Allergy    Coronary artery calcification seen on CT scan 04/19/2019   DEGENERATIVE JOINT DISEASE, LUMBAR SPINE 12/23/2006   [place] DISEASE, CERVICAL 12/23/2006   FATIGUE 04/28/2009   GERD 12/23/2006   HYPERCHOLESTEROLEMIA 12/23/2006   HYPERLIPIDEMIA 03/19/2007   HYPERSOMNIA 04/28/2009   HYPERTENSION 03/19/2007   HYPOTENSION 04/13/2009   INSOMNIA, HX OF 12/23/2006   LIVER MASS 12/11/2007   MENIERE'S DISEASE 12/23/2006   OTITIS MEDIA, LEFT 04/13/2009   PERFORATION, TYMPANIC MEMBRANE NOS 12/23/2006    Past Surgical History:  Procedure Laterality Date   left knee (MCL) repair     s/p left ear surgury for vertigo     s/p left knee medial collateral  ligament damage       Current Outpatient Medications:    ARMOUR THYROID 90 MG tablet, Take 1 tablet by mouth daily. , Disp: , Rfl: 2   diazepam (VALIUM) 5 MG tablet, Take 1 tablet (5 mg total) by mouth 3 times/day as needed-between meals & bedtime., Disp: 30 tablet, Rfl: 0   diazepam (VALIUM) 5 MG tablet, Take 1 tablet (5 mg total) by mouth every 12 (twelve) hours as needed for anxiety., Disp: 60 tablet, Rfl: 4   diazepam (VALIUM) 5 MG tablet, Take 1 tablet (5 mg total) by mouth every 8 (eight) hours as needed for anxiety., Disp: 60 tablet, Rfl: 4   methylcellulose oral powder, Take by mouth 2 (two) times daily., Disp: , Rfl:    Multiple Vitamin (MULTIVITAMIN) capsule, Take 1 capsule by mouth daily., Disp: , Rfl:    rosuvastatin (CRESTOR) 5 MG tablet, TAKE 1 TABLET BY MOUTH EVERY DAY, Disp: 90 tablet, Rfl: 1   tadalafil (CIALIS) 20 MG tablet, TAKE 1 TABLET BY MOUTH ONCE DAILY AS NEEDED FOR ERECTILE DYSFUNCTION, Disp: 30 tablet, Rfl: 0   telmisartan (MICARDIS) 80 MG tablet, Take 1 tablet (80 mg total) by mouth daily., Disp: 90 tablet, Rfl: 3   vitamin B-12 (CYANOCOBALAMIN) 500 MCG tablet, Take 500 mcg by mouth daily., Disp: , Rfl:    vitamin C (ASCORBIC ACID) 500 MG tablet, Take 1,000 mg by mouth daily., Disp: , Rfl:    VITAMIN D PO, Take by mouth daily., Disp: , Rfl:    zolpidem (AMBIEN) 10 MG tablet, TAKE 1 TABLET BY MOUTH AT BEDTIME AS  NEEDED, Disp: 90 tablet, Rfl: 1  EXAM:  VITALS per patient if applicable: T 99, O2 96  GENERAL: alert, oriented, appears well and in no acute distress  HEENT: atraumatic, conjunttiva clear, no obvious abnormalities on inspection of external nose and ears  NECK: normal movements of the head and neck  LUNGS: on inspection no signs of respiratory distress, breathing rate appears normal, no obvious gross SOB, gasping or wheezing  CV: no obvious cyanosis  MS: moves all visible extremities without noticeable abnormality  PSYCH/NEURO: pleasant and  cooperative, no obvious depression or anxiety, speech and thought processing grossly intact  ASSESSMENT AND PLAN:  Discussed the following assessment and plan:  Cough  Sore throat  Fever, unspecified fever cause  Body aches  -we discussed possible serious and likely etiologies, options for evaluation and workup, limitations of telemedicine visit vs in person visit, treatment, treatment risks and precautions. Pt is agreeable to treatment via telemedicine at this moment. Suspect possible influenza, covid 19 with false negative testing vs other. Advised since O2 running low to seek inperson evaluation and provided options. However, his O2 has improved today and his fevers are trending down so he has opted to see how thing are going today. Reports the cough is not bad and has declined a cough prescription. Advised to hold off on analgesic today to see fever trend, unless is uncomfortable.  Work/School slipped offered:  declined Advised to seek prompt in person care if worsening, new symptoms arise, or if is not improving with treatment. Discussed options for inperson care if PCP office not available. Did let this patient know that I only do telemedicine on Tuesdays and Thursdays for [place]. Advised to schedule follow up visit with PCP or [facility] if any further questions or concerns to avoid delays in care.   I discussed the assessment and treatment plan with the patient. The patient was provided an opportunity to ask questions and all were answered. The patient agreed with the plan and demonstrated an understanding of the instructions.     Lucretia Kern, DO

## 2021-01-18 ENCOUNTER — Telehealth: Payer: Self-pay

## 2021-01-18 DIAGNOSIS — J3489 Other specified disorders of nose and nasal sinuses: Secondary | ICD-10-CM

## 2021-01-18 MED ORDER — DIAZEPAM 5 MG PO TABS: 5.0000 mg | ORAL_TABLET | Freq: Two times a day (BID) | ORAL | 1 refills | Status: DC | PRN

## 2021-01-18 NOTE — Telephone Encounter (Signed)
Patient ENT Provider has retired and [employer] license has lased, so the patient is wondering if Dr. Jenny Reichmann can take over filling his Diazepam.   The pharmacy cant refill due to the license lapse. Patient also request knew ENT referral.

## 2021-01-18 NOTE — Telephone Encounter (Signed)
[place] for valium bid prn - done erx  Ok for ent referral -

## 2021-01-18 NOTE — Telephone Encounter (Signed)
Notified patient via voicemail. 

## 2021-01-22 ENCOUNTER — Encounter: Payer: Self-pay | Admitting: Gastroenterology

## 2021-02-07 DIAGNOSIS — G4733 Obstructive sleep apnea (adult) (pediatric): Secondary | ICD-10-CM | POA: Diagnosis not present

## 2021-03-08 ENCOUNTER — Ambulatory Visit (AMBULATORY_SURGERY_CENTER): Payer: BC Managed Care – PPO | Admitting: *Deleted

## 2021-03-08 ENCOUNTER — Other Ambulatory Visit: Payer: Self-pay

## 2021-03-08 VITALS — Ht 76.0 in | Wt 235.0 lb

## 2021-03-08 DIAGNOSIS — Z86010 Personal history of colonic polyps: Secondary | ICD-10-CM

## 2021-03-08 MED ORDER — NA SULFATE-K SULFATE-MG SULF 17.5-3.13-1.6 GM/177ML PO SOLN: 1.0000 | Freq: Once | ORAL | 0 refills | Status: AC

## 2021-03-08 NOTE — Progress Notes (Signed)
Pt's previsit is done over the phone and all paperwork (prep instructions, blank consent form to just read over) sent to patient.  Pt's name and DOB verified at the beginning of the previsit.  Pt denies any difficulty with ambulating.    No trouble with anesthesia, denies being told they were difficult to intubate, or hx/fam hx of malignant hyperthermia per pt   No egg or soy allergy  No home oxygen use   No medications for weight loss taken  Pt denies constipation issues  Pt informed that we do not do prior authorizations for prep

## 2021-03-09 DIAGNOSIS — G4733 Obstructive sleep apnea (adult) (pediatric): Secondary | ICD-10-CM | POA: Diagnosis not present

## 2021-03-20 ENCOUNTER — Encounter: Payer: Self-pay | Admitting: Gastroenterology

## 2021-03-26 ENCOUNTER — Other Ambulatory Visit: Payer: Self-pay

## 2021-03-26 ENCOUNTER — Encounter: Payer: Self-pay | Admitting: Gastroenterology

## 2021-03-26 ENCOUNTER — Ambulatory Visit (AMBULATORY_SURGERY_CENTER): Payer: BC Managed Care – PPO | Admitting: Gastroenterology

## 2021-03-26 VITALS — BP 107/72 | HR 65 | Temp 98.0°F | Resp 17 | Ht 76.0 in | Wt 235.0 lb

## 2021-03-26 DIAGNOSIS — Z1211 Encounter for screening for malignant neoplasm of colon: Secondary | ICD-10-CM | POA: Diagnosis not present

## 2021-03-26 DIAGNOSIS — D120 Benign neoplasm of cecum: Secondary | ICD-10-CM | POA: Diagnosis not present

## 2021-03-26 DIAGNOSIS — D123 Benign neoplasm of transverse colon: Secondary | ICD-10-CM

## 2021-03-26 DIAGNOSIS — Z86010 Personal history of colonic polyps: Secondary | ICD-10-CM

## 2021-03-26 MED ORDER — SODIUM CHLORIDE 0.9 % IV SOLN: 500.0000 mL | Freq: Once | INTRAVENOUS | Status: DC

## 2021-03-26 NOTE — Progress Notes (Signed)
Report to PACU, RN, vss, BBS= Clear.  

## 2021-03-26 NOTE — Patient Instructions (Addendum)
Read all of the handouts given to you by your recovery room nurse. Your next colonoscopy will be determined after the pathology comes back.  We recommend dulcolax 20 mg and more water the evening before your next colonoscopy.  YOU HAD AN ENDOSCOPIC PROCEDURE TODAY AT [facility]:   Refer to the procedure report that was given to you for any specific questions about what was found during the examination.  If the procedure report does not answer your questions, please call your gastroenterologist to clarify.  If you requested that your care partner not be given the details of your procedure findings, then the procedure report has been included in a sealed envelope for you to review at your convenience later.  YOU SHOULD EXPECT: Some feelings of bloating in the abdomen. Passage of more gas than usual.  Walking can help get rid of the air that was put into your GI tract during the procedure and reduce the bloating. If you had a lower endoscopy (such as a colonoscopy or flexible sigmoidoscopy) you may notice spotting of blood in your stool or on the toilet paper. If you underwent a bowel prep for your procedure, you may not have a normal bowel movement for a few days.  Please Note:  You might notice some irritation and congestion in your nose or some drainage.  This is from the oxygen used during your procedure.  There is no need for concern and it should clear up in a day or so.  SYMPTOMS TO REPORT IMMEDIATELY:  Following lower endoscopy (colonoscopy or flexible sigmoidoscopy):  Excessive amounts of blood in the stool  Significant tenderness or worsening of abdominal pains  Swelling of the abdomen that is new, acute  Fever of 100F or higher   For urgent or emergent issues, a gastroenterologist can be reached at any hour by calling [phone]. Do not use MyChart messaging for urgent concerns.    DIET:  We do recommend a small meal at first, but then you may proceed to your  regular diet.  Drink plenty of fluids but you should avoid alcoholic beverages for 24 hours.  ACTIVITY:  You should plan to take it easy for the rest of today and you should NOT DRIVE or use heavy machinery until tomorrow (because of the sedation medicines used during the test).    FOLLOW UP: Our staff will call the number listed on your records 48-72 hours following your procedure to check on you and address any questions or concerns that you may have regarding the information given to you following your procedure. If we do not reach you, we will leave a message.  We will attempt to reach you two times.  During this call, we will ask if you have developed any symptoms of COVID 19. If you develop any symptoms (ie: fever, flu-like symptoms, shortness of breath, cough etc.) before then, please call [phone].  If you test positive for Covid 19 in the 2 weeks post procedure, please call and report this information to [place].    If any biopsies were taken you will be contacted by phone or by letter within the next 1-3 weeks.  Please call us at [phone] if you have not heard about the biopsies in 3 weeks.    SIGNATURES/CONFIDENTIALITY: You and/or your care partner have signed paperwork which will be entered into your electronic medical record.  These signatures attest to the fact that that the information above on your After Visit Summary has  been reviewed and is understood.  Full responsibility of the confidentiality of this discharge information lies with you and/or your care-partner.

## 2021-03-26 NOTE — Progress Notes (Signed)
Called to room to assist during endoscopic procedure.  Patient ID and intended procedure confirmed with present staff. Received instructions for my participation in the procedure from the performing physician.  

## 2021-03-26 NOTE — Progress Notes (Signed)
Pt's states no medical or surgical changes since previsit or office visit. 

## 2021-03-26 NOTE — Op Note (Signed)
[place] Patient Name: Brad Ray Procedure Date: 03/26/2021 8:44 AM MRN: [id number] Endoscopist: [place]. Loletha Carrow , MD Age: 61 Referring MD:  Date of Birth: October 05, 1960 Gender: Male Account #: [id number] Procedure:                Colonoscopy Indications:              Increased risk colon cancer surveillance: Personal                            history of sessile serrated colon polyp (8-10 mm in                            size) with no dysplasia                           SSP x 2, 8-19mm, Nov 2018. 23mm SSP and 14mm TA in                            2015 Medicines:                Monitored Anesthesia Care Procedure:                Pre-Anesthesia Assessment:                           - Prior to the procedure, a History and Physical                            was performed, and patient medications and                            allergies were reviewed. The patient's tolerance of                            previous anesthesia was also reviewed. The risks                            and benefits of the procedure and the sedation                            options and risks were discussed with the patient.                            All questions were answered, and informed consent                            was obtained. Prior Anticoagulants: The patient has                            taken no previous anticoagulant or antiplatelet                            agents. ASA Grade Assessment: II - A patient with  mild systemic disease. After reviewing the risks                            and benefits, the patient was deemed in                            satisfactory condition to undergo the procedure.                           After obtaining informed consent, the colonoscope                            was passed under direct vision. Throughout the                            procedure, the patient's blood pressure, pulse, and                            oxygen  saturations were monitored continuously. The                            CF [id number] #[id number] was introduced through the anus                            and advanced to the the cecum, identified by                            appendiceal orifice and ileocecal valve. The                            colonoscopy was performed without difficulty. The                            patient tolerated the procedure well. The quality                            of the bowel preparation was good after lavage. The                            ileocecal valve, appendiceal orifice, and rectum                            were photographed. The bowel preparation used was                            SUPREP. Scope In: 8:57:01 AM Scope Out: 9:12:09 AM Scope Withdrawal Time: 0 hours 13 minutes 16 seconds  Total Procedure Duration: 0 hours 15 minutes 8 seconds  Findings:                 The perianal and digital rectal examinations were                            normal.  Repeat examination of right colon under NBI                            performed.                           A diminutive polyp was found in the cecum. The                            polyp was semi-sessile. The polyp was removed with                            a cold snare. Resection and retrieval were complete.                           A diminutive polyp was found in the proximal                            transverse colon. The polyp was semi-sessile. The                            polyp was removed with a cold snare. Resection and                            retrieval were complete.                           Internal hemorrhoids were found.                           The exam was otherwise without abnormality on                            direct and retroflexion views. Complications:            No immediate complications. Estimated Blood Loss:     Estimated blood loss was minimal. Impression:               - One diminutive  polyp in the cecum, removed with a                            cold snare. Resected and retrieved.                           - One diminutive polyp in the proximal transverse                            colon, removed with a cold snare. Resected and                            retrieved.                           - Internal hemorrhoids.                           -  The examination was otherwise normal on direct                            and retroflexion views. Recommendation:           - Patient has a contact number available for                            emergencies. The signs and symptoms of potential                            delayed complications were discussed with the                            patient. Return to normal activities tomorrow.                            Written discharge instructions were provided to the                            patient.                           - Resume previous diet.                           - Continue present medications.                           - Await pathology results.                           - Repeat colonoscopy is recommended for                            surveillance. The colonoscopy date will be                            determined after pathology results from today's                            exam become available for review. (for next exam -                            ducolax 20 mg before evening prep dose and consume                            more water with evening prep) [name] L. Loletha Carrow, MD 03/26/2021 9:19:23 AM This report has been signed electronically.

## 2021-03-26 NOTE — Progress Notes (Signed)
History and Physical:  This patient presents for endoscopic testing for: Encounter Diagnosis  Name Primary?   Personal history of colonic polyps Yes    SSP x 2 in Nov 2018 (SSP and TA in 2015) Patient denies chronic abdominal pain, rectal bleeding, constipation or diarrhea.   ROS: Patient denies chest pain or cough   Past Medical History: Past Medical History:  Diagnosis Date   Abdominal pain, right upper quadrant 12/09/2007   ALLERGIC RHINITIS 03/19/2007   Allergy    Coronary artery calcification seen on CT scan 04/19/2019   DEGENERATIVE JOINT DISEASE, LUMBAR SPINE 12/23/2006   [place] DISEASE, CERVICAL 12/23/2006   FATIGUE 04/28/2009   GERD 12/23/2006   HYPERCHOLESTEROLEMIA 12/23/2006   HYPERLIPIDEMIA 03/19/2007   HYPERSOMNIA 04/28/2009   HYPERTENSION 03/19/2007   HYPOTENSION 04/13/2009   INSOMNIA, HX OF 12/23/2006   LIVER MASS 12/11/2007   MENIERE'S DISEASE 12/23/2006   OTITIS MEDIA, LEFT 04/13/2009   PERFORATION, TYMPANIC MEMBRANE NOS 12/23/2006   Sleep apnea    uses CPAP     Past Surgical History: Past Surgical History:  Procedure Laterality Date   COLONOSCOPY     left knee (MCL) repair     s/p left ear surgury for vertigo     s/p left knee medial collateral ligament damage      Allergies: Allergies  Allergen Reactions   Penicillins     Childhood reaction    Outpatient Meds: Current Outpatient Medications  Medication Sig Dispense Refill   ARMOUR THYROID 90 MG tablet Take 1 tablet by mouth daily.   2   Multiple Vitamin (MULTIVITAMIN) capsule Take 1 capsule by mouth daily.     rosuvastatin (CRESTOR) 5 MG tablet TAKE 1 TABLET BY MOUTH EVERY DAY 90 tablet 1   telmisartan (MICARDIS) 80 MG tablet Take 1 tablet (80 mg total) by mouth daily. 90 tablet 3   vitamin B-12 (CYANOCOBALAMIN) 500 MCG tablet Take 500 mcg by mouth daily.     vitamin C (ASCORBIC ACID) 500 MG tablet Take 1,000 mg by mouth daily.     VITAMIN D PO Take by mouth daily.     zolpidem  (AMBIEN) 10 MG tablet TAKE 1 TABLET BY MOUTH AT BEDTIME AS NEEDED 90 tablet 1   diazepam (VALIUM) 5 MG tablet Take 1 tablet (5 mg total) by mouth every 12 (twelve) hours as needed for anxiety. 60 tablet 4   diazepam (VALIUM) 5 MG tablet Take 1 tablet (5 mg total) by mouth every 12 (twelve) hours as needed for anxiety. 60 tablet 1   methylcellulose oral powder Take by mouth 2 (two) times daily. Uses PRN     tadalafil (CIALIS) 20 MG tablet TAKE 1 TABLET BY MOUTH ONCE DAILY AS NEEDED FOR ERECTILE DYSFUNCTION 30 tablet 0   Current Facility-Administered Medications  Medication Dose Route Frequency Provider Last Rate Last Admin   0.9 %  sodium chloride infusion  500 mL Intravenous Once Doran Stabler, MD          ___________________________________________________________________ Objective   Exam:  BP 116/84    Pulse 65    Temp 98 F (36.7 C)    Resp 13    Ht 6\' 4"  (1.93 m)    Wt 235 lb (106.6 kg)    SpO2 94%    BMI 28.61 kg/m   CV: RRR without murmur, S1/S2 Resp: clear to auscultation bilaterally, normal RR and effort noted GI: soft, no tenderness, with active bowel sounds.   Assessment: Encounter Diagnosis  Name  Primary?   Personal history of colonic polyps Yes     Plan: Colonoscopy  The benefits and risks of the planned procedure were described in detail with the patient or (when appropriate) their health care proxy.  Risks were outlined as including, but not limited to, bleeding, infection, perforation, adverse medication reaction leading to cardiac or pulmonary decompensation, pancreatitis (if ERCP).  The limitation of incomplete mucosal visualization was also discussed.  No guarantees or warranties were given.    The patient is appropriate for an endoscopic procedure in the ambulatory setting.   - Wilfrid Lund, MD

## 2021-03-28 ENCOUNTER — Telehealth: Payer: Self-pay

## 2021-03-28 NOTE — Telephone Encounter (Signed)
°  Follow up Call-  Call back number 03/26/2021  Post procedure Call Back phone  # [phone]  Permission to leave phone message Yes  Some recent data might be hidden     Patient questions:  Do you have a fever, pain , or abdominal swelling? No. Pain Score  0 *  Have you tolerated food without any problems? Yes.    Have you been able to return to your normal activities? Yes.    Do you have any questions about your discharge instructions: Diet   No. Medications  No. Follow up visit  No.  Do you have questions or concerns about your Care? No.  Actions: * If pain score is 4 or above: No action needed, pain <4.

## 2021-03-29 ENCOUNTER — Encounter: Payer: Self-pay | Admitting: Gastroenterology

## 2021-04-02 ENCOUNTER — Other Ambulatory Visit: Payer: Self-pay | Admitting: Internal Medicine

## 2021-04-02 ENCOUNTER — Other Ambulatory Visit: Payer: Self-pay | Admitting: Cardiovascular Disease

## 2021-04-02 NOTE — Telephone Encounter (Signed)
Please refill as per office routine med refill policy (all routine meds to be refilled for 3 mo or monthly (per pt preference) up to one year from last visit, then month to month grace period for 3 mo, then further med refills will have to be denied) ? ?

## 2021-04-09 DIAGNOSIS — G4733 Obstructive sleep apnea (adult) (pediatric): Secondary | ICD-10-CM | POA: Diagnosis not present

## 2021-05-19 ENCOUNTER — Other Ambulatory Visit: Payer: Self-pay | Admitting: Cardiovascular Disease

## 2021-05-19 ENCOUNTER — Other Ambulatory Visit: Payer: Self-pay | Admitting: Internal Medicine

## 2021-07-15 ENCOUNTER — Other Ambulatory Visit: Payer: Self-pay | Admitting: Internal Medicine

## 2021-07-23 ENCOUNTER — Other Ambulatory Visit: Payer: Self-pay

## 2021-07-23 MED ORDER — ROSUVASTATIN CALCIUM 5 MG PO TABS: 5.0000 mg | ORAL_TABLET | Freq: Every day | ORAL | 0 refills | Status: DC

## 2021-07-30 ENCOUNTER — Other Ambulatory Visit: Payer: Self-pay | Admitting: Internal Medicine

## 2021-08-06 ENCOUNTER — Other Ambulatory Visit: Payer: Self-pay | Admitting: Cardiovascular Disease

## 2021-09-28 ENCOUNTER — Ambulatory Visit: Payer: BC Managed Care – PPO | Admitting: Cardiovascular Disease

## 2021-10-14 ENCOUNTER — Ambulatory Visit
Admission: EM | Admit: 2021-10-14 | Discharge: 2021-10-14 | Disposition: A | Discharge status: Home / Self Care | Payer: BC Managed Care – PPO | Attending: Physician Assistant | Admitting: Physician Assistant

## 2021-10-14 DIAGNOSIS — H60502 Unspecified acute noninfective otitis externa, left ear: Secondary | ICD-10-CM

## 2021-10-14 MED ORDER — CIPROFLOXACIN-DEXAMETHASONE 0.3-0.1 % OT SUSP: 4.0000 [drp] | Freq: Two times a day (BID) | OTIC | 0 refills | Status: AC

## 2021-10-14 NOTE — ED Triage Notes (Signed)
Pt presents with left outer ear pain X 2 days

## 2021-10-14 NOTE — ED Provider Notes (Signed)
[facility]    CSN: [id number] Arrival date & time: 10/14/21  1447      History   Chief Complaint Chief Complaint  Patient presents with   Otalgia    HPI STORY Brad Ray is a 61 y.o. male.   Patient here today for evaluation of left ear canal pain that started 2 days ago.  He reports that now he feels as if pain is started to radiate into his neck.  He has not had any fever.  He denies any other symptoms.  He has taken Tylenol with mild relief of symptoms.  The history is provided by the patient.  Otalgia Associated symptoms: no congestion, no cough, no fever and no sore throat     Past Medical History:  Diagnosis Date   Abdominal pain, right upper quadrant 12/09/2007   ALLERGIC RHINITIS 03/19/2007   Allergy    Coronary artery calcification seen on CT scan 04/19/2019   DEGENERATIVE JOINT DISEASE, LUMBAR SPINE 12/23/2006   [place] DISEASE, CERVICAL 12/23/2006   FATIGUE 04/28/2009   GERD 12/23/2006   HYPERCHOLESTEROLEMIA 12/23/2006   HYPERLIPIDEMIA 03/19/2007   HYPERSOMNIA 04/28/2009   HYPERTENSION 03/19/2007   HYPOTENSION 04/13/2009   INSOMNIA, HX OF 12/23/2006   LIVER MASS 12/11/2007   MENIERE'S DISEASE 12/23/2006   OTITIS MEDIA, LEFT 04/13/2009   PERFORATION, TYMPANIC MEMBRANE NOS 12/23/2006   Sleep apnea    uses CPAP    Patient Active Problem List   Diagnosis Date Noted   Obesity 11/05/2020   History of colon polyps 11/05/2020   Encounter for well adult exam with abnormal findings 11/02/2020   Insomnia 05/16/2020   Hyperglycemia 05/16/2020   Hepatitis C antibody positive in blood 12/03/2019   COVID-19 10/16/2019   History of 2019 novel coronavirus disease (COVID-19) 10/16/2019   Anxiety 07/07/2019   Depressive disorder 07/07/2019   Impaired cognition 07/07/2019   Parkinsonian features 07/07/2019   Carpal tunnel syndrome 05/03/2019   Coronary artery calcification seen on CT scan 04/19/2019   Body mass index (BMI) 29.0-29.9, adult 02/03/2019    Severe obstructive sleep apnea 08/31/2018   LUQ abdominal pain 12/23/2017   Rib pain on left side 12/23/2017   Left flank pain 12/23/2017   Chest pain 09/03/2017   Abnormal radiologic finding of lung field 09/03/2017   Increased prostate specific antigen (PSA) velocity 10/31/2016   Erectile dysfunction 08/08/2015   Right flank pain 08/08/2015   Shoulder pain 11/22/2014   Spinal stenosis in cervical region 10/19/2014   Abnormal MRI 01/12/2014   Hypothyroidism 01/12/2014   Abnormal brain MRI 01/12/2014   Hypogonadism male 03/17/2013   Laceration 03/09/2013   Conjunctivitis of both eyes 03/13/2012   Other reasons for seeking consultation 09/09/2010   Hypersomnia 04/28/2009   FATIGUE 04/28/2009   Hyperlipidemia 03/19/2007   Essential hypertension 03/19/2007   Seasonal rhinitis 03/19/2007   PERFORATION, TYMPANIC MEMBRANE NOS 12/23/2006   MENIERE'S DISEASE 12/23/2006   GERD 12/23/2006   DEGENERATIVE JOINT DISEASE, LUMBAR SPINE 12/23/2006   Cervical spondylosis without myelopathy 12/23/2006   INSOMNIA, HX OF 12/23/2006    Past Surgical History:  Procedure Laterality Date   COLONOSCOPY     left knee (MCL) repair     s/p left ear surgury for vertigo     s/p left knee medial collateral ligament damage         Home Medications    Prior to Admission medications   Medication Sig Start Date End Date Taking? Authorizing Provider  ciprofloxacin-dexamethasone (CIPRODEX) OTIC suspension Place  4 drops into the left ear 2 (two) times daily for 7 days. 10/14/21 10/21/21 Yes Francene Finders, PA-C  ARMOUR THYROID 90 MG tablet Take 1 tablet by mouth daily.  09/02/17   [provider]  diazepam (VALIUM) 5 MG tablet Take 1 tablet (5 mg total) by mouth every 12 (twelve) hours as needed for anxiety. 04/19/20   Rozetta Nunnery, MD  diazepam (VALIUM) 5 MG tablet TAKE 1 TABLET BY MOUTH EVERY 12 HOURS AS NEEDED FOR ANXIETY. 07/30/21   Biagio Borg, MD  methylcellulose oral powder Take  by mouth 2 (two) times daily. Uses PRN    [provider]  Multiple Vitamin (MULTIVITAMIN) capsule Take 1 capsule by mouth daily.    [provider]  rosuvastatin (CRESTOR) 5 MG tablet Take 1 tablet (5 mg total) by mouth daily. 08/08/21   Josue Hector, MD  tadalafil (CIALIS) 20 MG tablet TAKE 1 TABLET BY MOUTH ONCE DAILY AS NEEDED FOR ERECTILE DYSFUNCTION 07/15/21   Biagio Borg, MD  telmisartan (MICARDIS) 80 MG tablet TAKE 1 TABLET BY MOUTH EVERY DAY 04/03/21   Biagio Borg, MD  vitamin B-12 (CYANOCOBALAMIN) 500 MCG tablet Take 500 mcg by mouth daily.    [provider]  vitamin C (ASCORBIC ACID) 500 MG tablet Take 1,000 mg by mouth daily.    [provider]  VITAMIN D PO Take by mouth daily.    [provider]  zolpidem (AMBIEN) 10 MG tablet TAKE 1 TABLET BY MOUTH EVERY DAY AT BEDTIME AS NEEDED 05/20/21   Biagio Borg, MD    Family History Family History  Problem Relation Age of Onset   Diabetes Father    Hypertension Father    Heart disease Father    Alcohol abuse Other    Colon cancer Neg Hx    Pancreatic cancer Neg Hx    Stomach cancer Neg Hx    Esophageal cancer Neg Hx    Rectal cancer Neg Hx     Social History Social History   Tobacco Use   Smoking status: Never   Smokeless tobacco: Never  Vaping Use   Vaping Use: Never used  Substance Use Topics   Alcohol use: Yes    Comment: weekly beer   Drug use: No     Allergies   Penicillins   Review of Systems Review of Systems  Constitutional:  Negative for chills and fever.  HENT:  Positive for ear pain. Negative for congestion and sore throat.   Eyes:  Negative for discharge and redness.  Respiratory:  Negative for cough and shortness of breath.   Neurological:  Negative for numbness.     Physical Exam Triage Vital Signs ED Triage Vitals  Enc Vitals Group     BP 10/14/21 1501 118/81     Pulse Rate 10/14/21 1501 78     Resp 10/14/21 1501 18     Temp 10/14/21  1501 98.2 F (36.8 C)     Temp Source 10/14/21 1501 Oral     SpO2 10/14/21 1501 94 %     Weight --      Height --      Head Circumference --      Peak Flow --      Pain Score 10/14/21 1500 7     Pain Loc --      Pain Edu? --      Excl. in [place]? --    No data found.  Updated Vital  Signs BP 118/81 (BP Location: Left Arm)   Pulse 78   Temp 98.2 F (36.8 C) (Oral)   Resp 18   SpO2 94%      Physical Exam Vitals and nursing note reviewed.  Constitutional:      General: He is not in acute distress.    Appearance: Normal appearance. He is not ill-appearing.  HENT:     Head: Normocephalic and atraumatic.     Left Ear: External ear normal.     Ears:     Comments: Left EAC inflamed, unable to visualize this left TM, tragal movement pain noted.    Nose: Nose normal. No congestion or rhinorrhea.  Eyes:     Conjunctiva/sclera: Conjunctivae normal.  Cardiovascular:     Rate and Rhythm: Normal rate.  Pulmonary:     Effort: Pulmonary effort is normal.  Neurological:     Mental Status: He is alert.  Psychiatric:        Mood and Affect: Mood normal.        Behavior: Behavior normal.        Thought Content: Thought content normal.      UC Treatments / Results  Labs (all labs ordered are listed, but only abnormal results are displayed) Labs Reviewed - No data to display  EKG   Radiology No results found.  Procedures Procedures (including critical care time)  Medications Ordered in UC Medications - No data to display  Initial Impression / Assessment and Plan / UC Course  I have reviewed the triage vital signs and the nursing notes.  Pertinent labs & imaging results that were available during my care of the patient were reviewed by me and considered in my medical decision making (see chart for details).    Symptoms consistent with otitis externa.  Will treat with Ciprodex and encouraged follow-up if symptoms do not improve.  Final Clinical Impressions(s) / UC  Diagnoses   Final diagnoses:  Acute otitis externa of left ear, unspecified type   Discharge Instructions   None    ED Prescriptions     Medication Sig Dispense Auth. Provider   ciprofloxacin-dexamethasone (CIPRODEX) OTIC suspension Place 4 drops into the left ear 2 (two) times daily for 7 days. 7.5 mL Francene Finders, PA-C      PDMP not reviewed this encounter.   Francene Finders, PA-C 10/14/21 1514

## 2021-10-17 ENCOUNTER — Ambulatory Visit: Payer: BC Managed Care – PPO | Admitting: Family Medicine

## 2021-10-17 DIAGNOSIS — R42 Dizziness and giddiness: Secondary | ICD-10-CM | POA: Diagnosis not present

## 2021-10-17 DIAGNOSIS — H60332 Swimmer's ear, left ear: Secondary | ICD-10-CM | POA: Diagnosis not present

## 2021-10-17 DIAGNOSIS — H9042 Sensorineural hearing loss, unilateral, left ear, with unrestricted hearing on the contralateral side: Secondary | ICD-10-CM | POA: Diagnosis not present

## 2021-10-22 NOTE — Progress Notes (Signed)
CARDIOLOGY CONSULT NOTE       Patient ID: Brad Ray MRN: [id number] DOB/AGE: [birth year] 61 y.o.  Referring Physician: Jenny Reichmann Primary Physician: Biagio Borg, MD    HPI: 61 y.o. first seen at request of Dr Jenny Reichmann 11/10/19 for tachycardia. History of OSA and bad COVID infection August 2021 Received monoclonal antibody and f/U CT fortunately 01/11/20 showed good resolution of multi lobar pneumonia. 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 Cardiac CTA 04/16/19 with calcium score 70 which was 72 nd percentile for age and sex. He had been non compliant with statin and restarted He was on armor thyroid and T4 normal with suppressed TSH    Seen in ED 10/2021 for left sided Otalgia given cipro drops  History of colon polyps sees Dannis Had small sessile cecal polyp  Removed 03/2021   10/23/20 LDL 99  TSH 0.291   Wife with alpha gal and some allergy issues Son Dannielle Burn is still doing fitness work Daughter having 3 rd child now.   ROS All other systems reviewed and negative except as noted above  Past Medical History:  Diagnosis Date   Abdominal pain, right upper quadrant 12/09/2007   ALLERGIC RHINITIS 03/19/2007   Allergy    Coronary artery calcification seen on CT scan 04/19/2019   DEGENERATIVE JOINT DISEASE, LUMBAR SPINE 12/23/2006   [place] DISEASE, CERVICAL 12/23/2006   FATIGUE 04/28/2009   GERD 12/23/2006   HYPERCHOLESTEROLEMIA 12/23/2006   HYPERLIPIDEMIA 03/19/2007   HYPERSOMNIA 04/28/2009   HYPERTENSION 03/19/2007   HYPOTENSION 04/13/2009   INSOMNIA, HX OF 12/23/2006   LIVER MASS 12/11/2007   MENIERE'S DISEASE 12/23/2006   OTITIS MEDIA, LEFT 04/13/2009   PERFORATION, TYMPANIC MEMBRANE NOS 12/23/2006   Sleep apnea    uses CPAP    Family History  Problem Relation Age of Onset   Diabetes Father    Hypertension Father    Heart disease Father    Alcohol abuse Other    Colon cancer Neg Hx     Pancreatic cancer Neg Hx    Stomach cancer Neg Hx    Esophageal cancer Neg Hx    Rectal cancer Neg Hx     Social History   Socioeconomic History   Marital status: Married    Spouse name: Not on file   Number of children: 1   Years of education: Not on file   Highest education level: Not on file  Occupational History   Not on file  Tobacco Use   Smoking status: Never   Smokeless tobacco: Never  Vaping Use   Vaping Use: Never used  Substance and Sexual Activity   Alcohol use: Yes    Comment: weekly beer   Drug use: No   Sexual activity: Not on file  Other Topics Concern   Not on file  Social History Narrative   1 child at [employer]   Social Determinants of Health   Financial Resource Strain: Not on file  Food Insecurity: Not on file  Transportation Needs: Not on file  Physical Activity: Not on file  Stress: Not on file  Social Connections: Not on file  Intimate Partner Violence: Not on file    Past Surgical History:  Procedure Laterality Date   COLONOSCOPY     left knee (MCL) repair     s/p left ear surgury for vertigo     s/p left knee medial collateral ligament damage  Current Outpatient Medications:    ARMOUR THYROID 90 MG tablet, Take 1 tablet by mouth daily. , Disp: , Rfl: 2   diazepam (VALIUM) 5 MG tablet, Take 1 tablet (5 mg total) by mouth every 12 (twelve) hours as needed for anxiety., Disp: 60 tablet, Rfl: 4   diazepam (VALIUM) 5 MG tablet, TAKE 1 TABLET BY MOUTH EVERY 12 HOURS AS NEEDED FOR ANXIETY., Disp: 60 tablet, Rfl: 2   methylcellulose oral powder, Take by mouth 2 (two) times daily. Uses PRN, Disp: , Rfl:    Multiple Vitamin (MULTIVITAMIN) capsule, Take 1 capsule by mouth daily., Disp: , Rfl:    rosuvastatin (CRESTOR) 5 MG tablet, Take 1 tablet (5 mg total) by mouth daily., Disp: 90 tablet, Rfl: 0   tadalafil (CIALIS) 20 MG tablet, TAKE 1 TABLET BY MOUTH ONCE DAILY AS NEEDED FOR ERECTILE DYSFUNCTION, Disp: 30 tablet, Rfl: 0   telmisartan  (MICARDIS) 80 MG tablet, TAKE 1 TABLET BY MOUTH EVERY DAY, Disp: 90 tablet, Rfl: 1   vitamin B-12 (CYANOCOBALAMIN) 500 MCG tablet, Take 500 mcg by mouth daily., Disp: , Rfl:    vitamin C (ASCORBIC ACID) 500 MG tablet, Take 1,000 mg by mouth daily., Disp: , Rfl:    VITAMIN D PO, Take by mouth daily., Disp: , Rfl:    zolpidem (AMBIEN) 10 MG tablet, TAKE 1 TABLET BY MOUTH EVERY DAY AT BEDTIME AS NEEDED, Disp: 90 tablet, Rfl: 1     Physical Exam: Blood pressure 108/70, pulse 67, height '6\' 4"'$  (1.93 m), weight 244 lb 6.4 oz (110.9 kg), SpO2 94 %.    Affect appropriate Healthy:  appears stated age 24: normal Neck supple with no adenopathy JVP normal no bruits no thyromegaly Lungs clear with no wheezing and good diaphragmatic motion Heart:  S1/S2 no murmur, no rub, gallop or click PMI normal Abdomen: benighn, BS positve, no tenderness, no AAA no bruit.  No HSM or HJR Distal pulses intact with no bruits No edema Neuro non-focal Skin warm and dry No muscular weakness   Labs:   Lab Results  Component Value Date   WBC 6.2 10/23/2020   HGB 16.0 10/23/2020   HCT 47.3 10/23/2020   MCV 89.9 10/23/2020   PLT 288.0 10/23/2020   No results for input(s): "NA", "K", "CL", "CO2", "BUN", "CREATININE", "CALCIUM", "PROT", "BILITOT", "ALKPHOS", "ALT", "AST", "GLUCOSE" in the last 168 hours.  Invalid input(s): "LABALBU" No results found for: "CKTOTAL", "CKMB", "CKMBINDEX", "TROPONINI"  Lab Results  Component Value Date   CHOL 176 10/23/2020   CHOL 180 03/07/2020   CHOL 227 (H) 03/18/2019   Lab Results  Component Value Date   HDL 53.00 10/23/2020   HDL 57 03/07/2020   HDL 53.70 03/18/2019   Lab Results  Component Value Date   LDLCALC 99 10/23/2020   LDLCALC 100 (H) 03/07/2020   LDLCALC 155 (H) 12/11/2016   Lab Results  Component Value Date   TRIG 120.0 10/23/2020   TRIG 132 03/07/2020   TRIG 203.0 (H) 03/18/2019   Lab Results  Component Value Date   CHOLHDL 3 10/23/2020    CHOLHDL 3.2 03/07/2020   CHOLHDL 4 03/18/2019   Lab Results  Component Value Date   LDLDIRECT 131.0 03/18/2019   LDLDIRECT 133.0 07/15/2017   LDLDIRECT 173.7 03/17/2013      Radiology: No results found.  EKG: 03/25/19 SR normal ECG 10/23/2021 SR rate 67 normal    ASSESSMENT AND PLAN:   1. COVID:  Improved CT 01/11/20 with resolution of interstitial  changes  Sed Rate and C reactive protein normal November 10 2019   2. Tachycardia:  Improved ? Related to anxiety and COVID ECG normal 48 hr monitor never done will defer at this point   3. CAD: no chest pain high calcium score for age 17 which is 52 nd percentile for age and sex  04/2019 ASA/Statin   4. HTN:  On micardis normal   5. HLD: on crestor needs f/u labs   6. Thyroid:  On Armour Thyroid see above dosing per primary   7. DM:  Discussed low carb diet.  Target hemoglobin A1c is 6.5 or less.  Continue current medications.   Lipid/Liver  TSH/T4  F/U in a year    Signed: Jenkins Rouge 10/23/2021, 9:15 AM

## 2021-10-23 ENCOUNTER — Encounter: Payer: Self-pay | Admitting: Cardiovascular Disease

## 2021-10-23 ENCOUNTER — Ambulatory Visit (INDEPENDENT_AMBULATORY_CARE_PROVIDER_SITE_OTHER): Payer: BC Managed Care – PPO | Admitting: Cardiovascular Disease

## 2021-10-23 VITALS — BP 108/70 | HR 67 | Ht 76.0 in | Wt 244.4 lb

## 2021-10-23 DIAGNOSIS — E785 Hyperlipidemia, unspecified: Secondary | ICD-10-CM

## 2021-10-23 DIAGNOSIS — E039 Hypothyroidism, unspecified: Secondary | ICD-10-CM

## 2021-10-23 DIAGNOSIS — I2510 Atherosclerotic heart disease of native coronary artery without angina pectoris: Secondary | ICD-10-CM | POA: Diagnosis not present

## 2021-10-23 DIAGNOSIS — R000 Tachycardia, unspecified: Secondary | ICD-10-CM | POA: Diagnosis not present

## 2021-10-23 DIAGNOSIS — I10 Essential (primary) hypertension: Secondary | ICD-10-CM | POA: Diagnosis not present

## 2021-10-23 NOTE — Patient Instructions (Signed)
Medication Instructions:  Your physician recommends that you continue on your current medications as directed. Please refer to the Current Medication list given to you today.  *If you need a refill on your cardiac medications before your next appointment, please call your pharmacy*  Lab Work: Your physician recommends that you return for lab work tomorrow for fasting TSH, T4, Lipid and Liver panel.  If you have labs (blood work) drawn today and your tests are completely normal, you will receive your results only by: [place] (if you have MyChart) OR A paper copy in the mail If you have any lab test that is abnormal or we need to change your treatment, we will call you to review the results.  Testing/Procedures: None ordered today.  Follow-Up: At [facility], you and your health needs are our priority.  As part of our continuing mission to provide you with exceptional heart care, we have created designated Provider Care Teams.  These Care Teams include your primary Cardiologist (physician) and Advanced Practice Providers (APPs -  Physician Assistants and Nurse Practitioners) who all work together to provide you with the care you need, when you need it.  We recommend signing up for the patient portal called "MyChart".  Sign up information is provided on this After Visit Summary.  MyChart is used to connect with patients for Virtual Visits (Telemedicine).  Patients are able to view lab/test results, encounter notes, upcoming appointments, etc.  Non-urgent messages can be sent to your provider as well.   To learn more about what you can do with MyChart, go to [email or web address].    Your next appointment:   1 year(s)  The format for your next appointment:   In Person  Provider:   Jenkins Rouge, MD {    Important Information About Sugar

## 2021-10-24 ENCOUNTER — Other Ambulatory Visit: Payer: BC Managed Care – PPO

## 2021-10-24 DIAGNOSIS — R000 Tachycardia, unspecified: Secondary | ICD-10-CM | POA: Diagnosis not present

## 2021-10-24 DIAGNOSIS — E785 Hyperlipidemia, unspecified: Secondary | ICD-10-CM | POA: Diagnosis not present

## 2021-10-24 LAB — HEPATIC FUNCTION PANEL
ALT: 22 IU/L (ref 0–44)
AST: 23 IU/L (ref 0–40)
Albumin: 4.3 g/dL (ref 3.8–4.9)
Alkaline Phosphatase: 72 IU/L (ref 44–121)
Bilirubin Total: 0.5 mg/dL (ref 0.0–1.2)
Bilirubin, Direct: 0.14 mg/dL (ref 0.00–0.40)
Total Protein: 7.2 g/dL (ref 6.0–8.5)

## 2021-10-24 LAB — LIPID PANEL
Chol/HDL Ratio: 3.3 ratio (ref 0.0–5.0)
Cholesterol, Total: 167 mg/dL (ref 100–199)
HDL: 51 mg/dL (ref >39)
LDL Chol Calc (NIH): 93 mg/dL (ref 0–99)
Triglycerides: 128 mg/dL (ref 0–149)
VLDL Cholesterol Cal: 23 mg/dL (ref 5–40)

## 2021-10-24 LAB — TSH RFX ON ABNORMAL TO FREE T4: TSH: 2.1 u[IU]/mL (ref 0.450–4.500)

## 2021-10-30 ENCOUNTER — Other Ambulatory Visit: Payer: Self-pay | Admitting: Cardiovascular Disease

## 2021-10-30 ENCOUNTER — Other Ambulatory Visit: Payer: Self-pay | Admitting: Internal Medicine

## 2021-10-30 NOTE — Telephone Encounter (Signed)
Please refill as per office routine med refill policy (all routine meds to be refilled for 3 mo or monthly (per pt preference) up to one year from last visit, then month to month grace period for 3 mo, then further med refills will have to be denied) ? ?

## 2021-11-18 ENCOUNTER — Other Ambulatory Visit: Payer: Self-pay | Admitting: Internal Medicine

## 2021-11-19 ENCOUNTER — Ambulatory Visit (INDEPENDENT_AMBULATORY_CARE_PROVIDER_SITE_OTHER): Payer: BC Managed Care – PPO | Admitting: Internal Medicine

## 2021-11-19 ENCOUNTER — Ambulatory Visit (INDEPENDENT_AMBULATORY_CARE_PROVIDER_SITE_OTHER): Payer: BC Managed Care – PPO

## 2021-11-19 VITALS — BP 130/82 | HR 83 | Temp 97.8°F | Ht 76.0 in | Wt 246.0 lb

## 2021-11-19 DIAGNOSIS — R079 Chest pain, unspecified: Secondary | ICD-10-CM

## 2021-11-19 DIAGNOSIS — R739 Hyperglycemia, unspecified: Secondary | ICD-10-CM

## 2021-11-19 DIAGNOSIS — E559 Vitamin D deficiency, unspecified: Secondary | ICD-10-CM

## 2021-11-19 DIAGNOSIS — E538 Deficiency of other specified B group vitamins: Secondary | ICD-10-CM

## 2021-11-19 DIAGNOSIS — Z0001 Encounter for general adult medical examination with abnormal findings: Secondary | ICD-10-CM | POA: Diagnosis not present

## 2021-11-19 DIAGNOSIS — Z125 Encounter for screening for malignant neoplasm of prostate: Secondary | ICD-10-CM | POA: Diagnosis not present

## 2021-11-19 DIAGNOSIS — R0789 Other chest pain: Secondary | ICD-10-CM | POA: Diagnosis not present

## 2021-11-19 DIAGNOSIS — I10 Essential (primary) hypertension: Secondary | ICD-10-CM

## 2021-11-19 MED ORDER — ARMOUR THYROID 90 MG PO TABS
90.0000 mg | ORAL_TABLET | Freq: Every day | ORAL | 3 refills | Status: DC
Start: 2021-11-19 — End: 2023-03-25

## 2021-11-19 NOTE — Progress Notes (Unsigned)
Patient ID: Brad Ray, male   DOB: 03-01-1961, 61 y.o.   MRN: [id number]        Chief Complaint: follow up HTN, HLD and hyperglycemia ***       HPI:  Brad Ray is a 61 y.o. male here with c/o        Wt Readings from Last 3 Encounters:  11/19/21 246 lb (111.6 kg)  10/23/21 244 lb 6.4 oz (110.9 kg)  03/26/21 235 lb (106.6 kg)   BP Readings from Last 3 Encounters:  11/19/21 130/82  10/23/21 108/70  10/14/21 118/81         Past Medical History:  Diagnosis Date   Abdominal pain, right upper quadrant 12/09/2007   ALLERGIC RHINITIS 03/19/2007   Allergy    Coronary artery calcification seen on CT scan 04/19/2019   DEGENERATIVE JOINT DISEASE, LUMBAR SPINE 12/23/2006   [place] DISEASE, CERVICAL 12/23/2006   FATIGUE 04/28/2009   GERD 12/23/2006   HYPERCHOLESTEROLEMIA 12/23/2006   HYPERLIPIDEMIA 03/19/2007   HYPERSOMNIA 04/28/2009   HYPERTENSION 03/19/2007   HYPOTENSION 04/13/2009   INSOMNIA, HX OF 12/23/2006   LIVER MASS 12/11/2007   MENIERE'S DISEASE 12/23/2006   OTITIS MEDIA, LEFT 04/13/2009   PERFORATION, TYMPANIC MEMBRANE NOS 12/23/2006   Sleep apnea    uses CPAP   Past Surgical History:  Procedure Laterality Date   COLONOSCOPY     left knee (MCL) repair     s/p left ear surgury for vertigo     s/p left knee medial collateral ligament damage      reports that he has never smoked. He has never used smokeless tobacco. He reports current alcohol use. He reports that he does not use drugs. family history includes Alcohol abuse in an other family member; Diabetes in his father; Heart disease in his father; Hypertension in his father. Allergies  Allergen Reactions   Penicillins     Childhood reaction   Current Outpatient Medications on File Prior to Visit  Medication Sig Dispense Refill   ARMOUR THYROID 90 MG tablet Take 1 tablet by mouth daily.   2   diazepam (VALIUM) 5 MG tablet Take 1 tablet (5 mg total) by mouth every 12 (twelve) hours as needed for anxiety.  60 tablet 4   diazepam (VALIUM) 5 MG tablet TAKE 1 TABLET BY MOUTH EVERY 12 HOURS AS NEEDED FOR ANXIETY. 60 tablet 2   methylcellulose oral powder Take by mouth 2 (two) times daily. Uses PRN     Multiple Vitamin (MULTIVITAMIN) capsule Take 1 capsule by mouth daily.     rosuvastatin (CRESTOR) 5 MG tablet TAKE 1 TABLET (5 MG TOTAL) BY MOUTH DAILY. 90 tablet 1   tadalafil (CIALIS) 20 MG tablet TAKE 1 TABLET BY MOUTH ONCE DAILY AS NEEDED FOR ERECTILE DYSFUNCTION 30 tablet 0   telmisartan (MICARDIS) 80 MG tablet TAKE 1 TABLET BY MOUTH EVERY DAY 90 tablet 1   vitamin B-12 (CYANOCOBALAMIN) 500 MCG tablet Take 500 mcg by mouth daily.     vitamin C (ASCORBIC ACID) 500 MG tablet Take 1,000 mg by mouth daily.     VITAMIN D PO Take by mouth daily.     zolpidem (AMBIEN) 10 MG tablet TAKE 1 TABLET BY MOUTH EVERY DAY AT BEDTIME AS NEEDED 90 tablet 1   No current facility-administered medications on file prior to visit.        ROS:  All others reviewed and negative.  Objective        PE:  BP 130/82 (  BP Location: Right Arm, Patient Position: Sitting, Cuff Size: Large)   Pulse 83   Temp 97.8 F (36.6 C) (Oral)   Ht '6\' 4"'$  (1.93 m)   Wt 246 lb (111.6 kg)   SpO2 92%   BMI 29.94 kg/m                 Constitutional: Pt appears in NAD               HENT: Head: NCAT.                Right Ear: External ear normal.                 Left Ear: External ear normal.                Eyes: . Pupils are equal, round, and reactive to light. Conjunctivae and EOM are normal               Nose: without d/c or deformity               Neck: Neck supple. Gross normal ROM               Cardiovascular: Normal rate and regular rhythm.                 Pulmonary/Chest: Effort normal and breath sounds without rales or wheezing.                Abd:  Soft, NT, ND, + BS, no organomegaly               Neurological: Pt is alert. At baseline orientation, motor grossly intact               Skin: Skin is warm. No rashes, no other new  lesions, LE edema - ***               Psychiatric: Pt behavior is normal without agitation   Micro: none  Cardiac tracings I have personally interpreted today:  none  Pertinent Radiological findings (summarize): none   Lab Results  Component Value Date   WBC 6.2 10/23/2020   HGB 16.0 10/23/2020   HCT 47.3 10/23/2020   PLT 288.0 10/23/2020   GLUCOSE 96 10/23/2020   CHOL 167 10/24/2021   TRIG 128 10/24/2021   HDL 51 10/24/2021   LDLDIRECT 131.0 03/18/2019   LDLCALC 93 10/24/2021   ALT 22 10/24/2021   AST 23 10/24/2021   NA 137 10/23/2020   K 4.8 10/23/2020   CL 103 10/23/2020   CREATININE 0.93 10/23/2020   BUN 14 10/23/2020   CO2 25 10/23/2020   TSH 2.100 10/24/2021   PSA 2.27 10/23/2020   HGBA1C 5.3 08/03/2015   MICROALBUR 1.3 08/03/2015   Assessment/Plan:  Brad Ray is a 61 y.o. White or Caucasian [1] male with  has a past medical history of Abdominal pain, right upper quadrant (12/09/2007), ALLERGIC RHINITIS (03/19/2007), Allergy, Coronary artery calcification seen on CT scan (04/19/2019), DEGENERATIVE JOINT DISEASE, LUMBAR SPINE (12/23/2006), [place] DISEASE, CERVICAL (12/23/2006), FATIGUE (04/28/2009), GERD (12/23/2006), HYPERCHOLESTEROLEMIA (12/23/2006), HYPERLIPIDEMIA (03/19/2007), HYPERSOMNIA (04/28/2009), HYPERTENSION (03/19/2007), HYPOTENSION (04/13/2009), INSOMNIA, HX OF (12/23/2006), LIVER MASS (12/11/2007), MENIERE'S DISEASE (12/23/2006), OTITIS MEDIA, LEFT (04/13/2009), PERFORATION, TYMPANIC MEMBRANE NOS (12/23/2006), and Sleep apnea.  No problem-specific Assessment & Plan notes found for this encounter.  Followup: No follow-ups on file.  Cathlean Cower, MD 11/19/2021 4:10 PM [facility]

## 2021-11-19 NOTE — Patient Instructions (Addendum)
Please have your Shingrix (shingles) shots done at your local pharmacy.  Please continue all other medications as before, and refills have been done if requested.  Please have the pharmacy call with any other refills you may need.  Please continue your efforts at being more active, low cholesterol diet, and weight control.  You are otherwise up to date with prevention measures today.  Please keep your appointments with your specialists as you may have planned  Please go to the XRAY Department in the first floor for the x-ray testing  Please go to the LAB at the blood drawing area for the tests to be done  You will be contacted by phone if any changes need to be made immediately.  Otherwise, you will receive a letter about your results with an explanation, but please check with MyChart first.  Please remember to sign up for MyChart if you have not done so, as this will be important to you in the future with finding out test results, communicating by private email, and scheduling acute appointments online when needed.  Please make an Appointment to return for your 1 year visit, or sooner if needed, with Lab testing by Appointment as well, to be done about 3-5 days before at the [facility] (so this is for TWO appointments - please see the scheduling desk as you leave)

## 2021-11-20 ENCOUNTER — Encounter: Payer: Self-pay | Admitting: Internal Medicine

## 2021-11-20 DIAGNOSIS — R079 Chest pain, unspecified: Secondary | ICD-10-CM | POA: Insufficient documentation

## 2021-11-20 NOTE — Assessment & Plan Note (Signed)

## 2021-11-20 NOTE — Assessment & Plan Note (Signed)
Lab Results  Component Value Date   HGBA1C 5.3 08/03/2015   Stable, pt to continue current medical treatment  - diet, wt control, excercise

## 2021-11-20 NOTE — Assessment & Plan Note (Signed)
Has point tenderness associated without swelling, bruising, I suspect underlying interosseous muscle tear vs other; for cxr, rib films, declines other pain med

## 2021-11-20 NOTE — Assessment & Plan Note (Signed)
BP Readings from Last 3 Encounters:  11/19/21 130/82  10/23/21 108/70  10/14/21 118/81   Stable, pt to continue medical treatment micardis 80 mg qd

## 2021-11-21 ENCOUNTER — Other Ambulatory Visit: Payer: Self-pay | Admitting: Internal Medicine

## 2021-11-21 DIAGNOSIS — S2232XD Fracture of one rib, left side, subsequent encounter for fracture with routine healing: Secondary | ICD-10-CM

## 2021-12-01 ENCOUNTER — Other Ambulatory Visit: Payer: Self-pay | Admitting: Internal Medicine

## 2021-12-04 ENCOUNTER — Telehealth: Payer: Self-pay | Admitting: Gastroenterology

## 2021-12-04 NOTE — Telephone Encounter (Signed)
Inbound call from patient stating that he has an appointment on 10/13 at 8:30 with Alonza Bogus for abdominal pain. Patient stated that he went to see is PCP Dr. Jenny Reichmann and they did a X-ray and found that he has a broken rib. Patient is seeking advice if he needs to keep the appointment or if he can cancel. Please advise.

## 2021-12-04 NOTE — Telephone Encounter (Signed)
Lm on vm for patient to return call.  Patient does not need to keep appointment if pain is related to broken rib.

## 2021-12-06 NOTE — Telephone Encounter (Signed)
Pt called back and cancelled appt.

## 2021-12-11 DIAGNOSIS — H9042 Sensorineural hearing loss, unilateral, left ear, with unrestricted hearing on the contralateral side: Secondary | ICD-10-CM | POA: Diagnosis not present

## 2021-12-11 DIAGNOSIS — H60332 Swimmer's ear, left ear: Secondary | ICD-10-CM | POA: Diagnosis not present

## 2021-12-11 DIAGNOSIS — T162XXA Foreign body in left ear, initial encounter: Secondary | ICD-10-CM | POA: Diagnosis not present

## 2021-12-21 ENCOUNTER — Ambulatory Visit: Payer: BC Managed Care – PPO | Admitting: Gastroenterology

## 2022-01-08 DIAGNOSIS — H608X2 Other otitis externa, left ear: Secondary | ICD-10-CM | POA: Diagnosis not present

## 2022-01-08 DIAGNOSIS — H6122 Impacted cerumen, left ear: Secondary | ICD-10-CM | POA: Diagnosis not present

## 2022-01-21 ENCOUNTER — Other Ambulatory Visit: Payer: Self-pay | Admitting: Internal Medicine

## 2022-01-27 ENCOUNTER — Other Ambulatory Visit: Payer: Self-pay | Admitting: Internal Medicine

## 2022-04-08 ENCOUNTER — Telehealth: Payer: Self-pay | Admitting: Internal Medicine

## 2022-04-08 MED ORDER — NIRMATRELVIR/RITONAVIR (PAXLOVID)TABLET: 3.0000 | ORAL_TABLET | Freq: Two times a day (BID) | ORAL | 0 refills | Status: AC

## 2022-04-08 NOTE — Telephone Encounter (Signed)
Ok done to [facility] [address]

## 2022-04-08 NOTE — Telephone Encounter (Signed)
Patient called back about getting medication for Covid. He said he is on day 4 of his symptoms.

## 2022-04-08 NOTE — Telephone Encounter (Signed)
Patient called, he just tested positive for COVID, and would like something sent in for him.

## 2022-04-29 DIAGNOSIS — L814 Other melanin hyperpigmentation: Secondary | ICD-10-CM | POA: Diagnosis not present

## 2022-04-29 DIAGNOSIS — D485 Neoplasm of uncertain behavior of skin: Secondary | ICD-10-CM | POA: Diagnosis not present

## 2022-04-29 DIAGNOSIS — L821 Other seborrheic keratosis: Secondary | ICD-10-CM | POA: Diagnosis not present

## 2022-04-29 DIAGNOSIS — D235 Other benign neoplasm of skin of trunk: Secondary | ICD-10-CM | POA: Diagnosis not present

## 2022-04-29 DIAGNOSIS — D1801 Hemangioma of skin and subcutaneous tissue: Secondary | ICD-10-CM | POA: Diagnosis not present

## 2022-05-03 ENCOUNTER — Other Ambulatory Visit: Payer: Self-pay | Admitting: Cardiovascular Disease

## 2022-05-22 ENCOUNTER — Other Ambulatory Visit: Payer: Self-pay | Admitting: Internal Medicine

## 2022-06-03 ENCOUNTER — Other Ambulatory Visit: Payer: Self-pay | Admitting: Gastroenterology

## 2022-06-03 DIAGNOSIS — Z86010 Personal history of colonic polyps: Secondary | ICD-10-CM

## 2022-06-19 ENCOUNTER — Ambulatory Visit (INDEPENDENT_AMBULATORY_CARE_PROVIDER_SITE_OTHER): Payer: BC Managed Care – PPO | Admitting: Pulmonary Disease

## 2022-06-19 ENCOUNTER — Encounter: Payer: Self-pay | Admitting: Pulmonary Disease

## 2022-06-19 VITALS — BP 112/76 | HR 78 | Ht 76.0 in | Wt 267.0 lb

## 2022-06-19 DIAGNOSIS — G4733 Obstructive sleep apnea (adult) (pediatric): Secondary | ICD-10-CM | POA: Diagnosis not present

## 2022-06-19 NOTE — Patient Instructions (Signed)
Continue using CPAP nightly  We will send a prescription to [facility] for CPAP supplies  Will see you a year from now  Call with significant concerns

## 2022-06-19 NOTE — Addendum Note (Signed)
Addended by: Lanna Poche on: 06/19/2022 02:44 PM   Modules accepted: Orders

## 2022-06-19 NOTE — Progress Notes (Signed)
Brad Ray    [id number]    Sep 11, 1960  Primary Care Physician:John, Len Blalock, MD  Referring Physician: Corwin Levins, MD [address] [place],  [place] [address]  Chief complaint:   History of significant snoring, witnessed apneas during recent moderate sedation  HPI:  Most recent sleep study shows an AHI of 42.8  Has been using CPAP nightly Does use a travel CPAP whenever is not using his main CPAP  Feels better overall  Suffers from a lot of allergies  Continues to benefit from using CPAP nightly  It did take him a while to get used to using it but tolerating it okay Wakes up feeling like he is at a good nights rest  Family history of obstructive sleep apnea in his brother   Tillie Rung to get about 7.5 hours of sleep  Weight has been relatively stable  Outpatient Encounter Medications as of 06/19/2022  Medication Sig   ARMOUR THYROID 90 MG tablet Take 1 tablet (90 mg total) by mouth daily.   diazepam (VALIUM) 5 MG tablet TAKE 1 TABLET BY MOUTH EVERY 12 HOURS AS NEEDED FOR ANXIETY   methylcellulose oral powder Take by mouth 2 (two) times daily. Uses PRN   Multiple Vitamin (MULTIVITAMIN) capsule Take 1 capsule by mouth daily.   rosuvastatin (CRESTOR) 5 MG tablet TAKE 1 TABLET (5 MG TOTAL) BY MOUTH DAILY.   tadalafil (CIALIS) 20 MG tablet TAKE 1 TABLET BY MOUTH ONCE DAILY AS NEEDED FOR ERECTILE DYSFUNCTION   telmisartan (MICARDIS) 80 MG tablet TAKE 1 TABLET BY MOUTH EVERY DAY   vitamin B-12 (CYANOCOBALAMIN) 500 MCG tablet Take 500 mcg by mouth daily.   vitamin C (ASCORBIC ACID) 500 MG tablet Take 1,000 mg by mouth daily.   VITAMIN D PO Take by mouth daily.   zolpidem (AMBIEN) 10 MG tablet TAKE 1 TABLET BY MOUTH EVERY DAY AT BEDTIME AS NEEDED   No facility-administered encounter medications on file as of 06/19/2022.    Allergies as of 06/19/2022 - Review Complete 06/19/2022  Allergen Reaction Noted   Penicillins      Past Medical History:   Diagnosis Date   Abdominal pain, right upper quadrant 12/09/2007   ALLERGIC RHINITIS 03/19/2007   Allergy    Coronary artery calcification seen on CT scan 04/19/2019   DEGENERATIVE JOINT DISEASE, LUMBAR SPINE 12/23/2006   DISC DISEASE, CERVICAL 12/23/2006   FATIGUE 04/28/2009   GERD 12/23/2006   HYPERCHOLESTEROLEMIA 12/23/2006   HYPERLIPIDEMIA 03/19/2007   HYPERSOMNIA 04/28/2009   HYPERTENSION 03/19/2007   HYPOTENSION 04/13/2009   INSOMNIA, HX OF 12/23/2006   LIVER MASS 12/11/2007   MENIERE'S DISEASE 12/23/2006   OTITIS MEDIA, LEFT 04/13/2009   PERFORATION, TYMPANIC MEMBRANE NOS 12/23/2006   Sleep apnea    uses CPAP    Past Surgical History:  Procedure Laterality Date   COLONOSCOPY     left knee (MCL) repair     s/p left ear surgury for vertigo     s/p left knee medial collateral ligament damage      Family History  Problem Relation Age of Onset   Diabetes Father    Hypertension Father    Heart disease Father    Alcohol abuse Other    Colon cancer Neg Hx    Pancreatic cancer Neg Hx    Stomach cancer Neg Hx    Esophageal cancer Neg Hx    Rectal cancer Neg Hx     Social History  Socioeconomic History   Marital status: Married    Spouse name: Not on file   Number of children: 1   Years of education: Not on file   Highest education level: Not on file  Occupational History   Not on file  Tobacco Use   Smoking status: Never   Smokeless tobacco: Never  Vaping Use   Vaping Use: Never used  Substance and Sexual Activity   Alcohol use: Yes    Comment: weekly beer   Drug use: No   Sexual activity: Not on file  Other Topics Concern   Not on file  Social History Narrative   1 child at [employer]   Social Determinants of Health   Financial Resource Strain: Not on file  Food Insecurity: Not on file  Transportation Needs: Not on file  Physical Activity: Not on file  Stress: Not on file  Social Connections: Not on file  Intimate Partner Violence: Not on  file    Review of Systems  Constitutional: Negative.   HENT: Negative.    Eyes: Negative.   Respiratory:  Positive for apnea and cough. Negative for shortness of breath.   Cardiovascular: Negative.   Psychiatric/Behavioral:  Positive for sleep disturbance.   All other systems reviewed and are negative.   Vitals:   06/19/22 1349  BP: 112/76  Pulse: 78  SpO2: 96%     Physical Exam Constitutional:      General: He is not in acute distress.    Appearance: He is well-developed. He is not diaphoretic.  HENT:     Head: Normocephalic and atraumatic.  Eyes:     General:        Right eye: No discharge.        Left eye: No discharge.     Conjunctiva/sclera: Conjunctivae normal.     Pupils: Pupils are equal, round, and reactive to light.  Neck:     Thyroid: No thyromegaly.     Trachea: No tracheal deviation.  Cardiovascular:     Rate and Rhythm: Normal rate and regular rhythm.  Pulmonary:     Effort: Pulmonary effort is normal. No respiratory distress.     Breath sounds: Normal breath sounds. No wheezing or rales.  Chest:     Chest wall: No tenderness.  Abdominal:     General: Bowel sounds are normal. There is no distension.     Palpations: Abdomen is soft.     Tenderness: There is no abdominal tenderness. There is no rebound.  Musculoskeletal:     Cervical back: Normal range of motion and neck supple.  Neurological:     Mental Status: He is alert.    Compliance data reviewed showing excellent compliance with 82% compliance Average use of 7 hours 33 minutes AutoSet 5-15 Residual AHI of 8 -Does use a travel device when he is His main CPAP  Assessment:  Severe obstructive sleep apnea  Tolerating CPAP well and benefiting from CPAP use    Plan/Recommendations:  Continue CPAP nightly  DME referral for CPAP supplies  Treatment options for sleep disordered breathing discussed  Virl Diamond MD [facility] 06/19/2022, 2:31 PM  CC:  Corwin Levins, MD

## 2022-07-02 DIAGNOSIS — G4733 Obstructive sleep apnea (adult) (pediatric): Secondary | ICD-10-CM | POA: Diagnosis not present

## 2022-07-25 DIAGNOSIS — H524 Presbyopia: Secondary | ICD-10-CM | POA: Diagnosis not present

## 2022-07-25 DIAGNOSIS — H35371 Puckering of macula, right eye: Secondary | ICD-10-CM | POA: Diagnosis not present

## 2022-07-25 DIAGNOSIS — H2513 Age-related nuclear cataract, bilateral: Secondary | ICD-10-CM | POA: Diagnosis not present

## 2022-08-01 DIAGNOSIS — G4733 Obstructive sleep apnea (adult) (pediatric): Secondary | ICD-10-CM | POA: Diagnosis not present

## 2022-08-26 ENCOUNTER — Telehealth: Payer: Self-pay | Admitting: Cardiovascular Disease

## 2022-08-26 DIAGNOSIS — I2510 Atherosclerotic heart disease of native coronary artery without angina pectoris: Secondary | ICD-10-CM

## 2022-08-26 NOTE — Telephone Encounter (Signed)
Called patient to let him know that order has been placed. While on the phone patient started asking about being seen sooner. Patient complained about SOB with activity, numbness in left hand, tiredness, and neck discomfort for the last 3 weeks. Patient stated his BP, HR, and O2 have been fine, but he just not feeling right. Offered patient DOD spot on Wednesday, patient would like to get CT done first. Informed patient that a message would be sent to Dr. Eden Emms for advisement and go from there. Patient stated he lifts weights and works out, and he will be fine until he gets in the the car. Patient is not sure if this is anxiety or not. Will forward to Dr. Eden Emms.

## 2022-08-26 NOTE — Telephone Encounter (Signed)
Per Dr. Eden Emms, Does not sound cardiac should make appointment with his primary. Patient aware of Dr. Fabio Bering advisement. Patient will just have CT done and see Dr. Eden Emms next month.

## 2022-08-26 NOTE — Telephone Encounter (Signed)
Will forward to Dr. Eden Emms. [facility] Cardiac Calcium Score CT was in 04/2019.

## 2022-08-26 NOTE — Telephone Encounter (Signed)
Pt called in asking if he can have CT cardiac scoring ordered prior to his f/u in August.

## 2022-08-27 ENCOUNTER — Telehealth: Payer: Self-pay | Admitting: Internal Medicine

## 2022-08-27 NOTE — Telephone Encounter (Signed)
Error

## 2022-08-28 ENCOUNTER — Other Ambulatory Visit (INDEPENDENT_AMBULATORY_CARE_PROVIDER_SITE_OTHER): Payer: BC Managed Care – PPO

## 2022-08-28 DIAGNOSIS — R079 Chest pain, unspecified: Secondary | ICD-10-CM

## 2022-08-28 DIAGNOSIS — I10 Essential (primary) hypertension: Secondary | ICD-10-CM

## 2022-08-28 DIAGNOSIS — E559 Vitamin D deficiency, unspecified: Secondary | ICD-10-CM | POA: Diagnosis not present

## 2022-08-28 DIAGNOSIS — R739 Hyperglycemia, unspecified: Secondary | ICD-10-CM

## 2022-08-28 DIAGNOSIS — E538 Deficiency of other specified B group vitamins: Secondary | ICD-10-CM | POA: Diagnosis not present

## 2022-08-28 DIAGNOSIS — Z125 Encounter for screening for malignant neoplasm of prostate: Secondary | ICD-10-CM

## 2022-08-28 LAB — CBC WITH DIFFERENTIAL/PLATELET
Basophils Absolute: 0 10*3/uL (ref 0.0–0.1)
Basophils Relative: 0.6 % (ref 0.0–3.0)
Eosinophils Absolute: 0.2 10*3/uL (ref 0.0–0.7)
Eosinophils Relative: 3.3 % (ref 0.0–5.0)
HCT: 45.9 % (ref 39.0–52.0)
Hemoglobin: 15.1 g/dL (ref 13.0–17.0)
Lymphocytes Relative: 39.3 % (ref 12.0–46.0)
Lymphs Abs: 2.2 10*3/uL (ref 0.7–4.0)
MCHC: 33 g/dL (ref 30.0–36.0)
MCV: 90.3 fl (ref 78.0–100.0)
Monocytes Absolute: 0.7 10*3/uL (ref 0.1–1.0)
Monocytes Relative: 12.3 % — ABNORMAL HIGH (ref 3.0–12.0)
Neutro Abs: 2.5 10*3/uL (ref 1.4–7.7)
Neutrophils Relative %: 44.5 % (ref 43.0–77.0)
Platelets: 259 10*3/uL (ref 150.0–400.0)
RBC: 5.08 Mil/uL (ref 4.22–5.81)
RDW: 13.6 % (ref 11.5–15.5)
WBC: 5.7 10*3/uL (ref 4.0–10.5)

## 2022-08-28 LAB — BASIC METABOLIC PANEL
BUN: 14 mg/dL (ref 6–23)
CO2: 26 mEq/L (ref 19–32)
Calcium: 8.9 mg/dL (ref 8.4–10.5)
Chloride: 104 mEq/L (ref 96–112)
Creatinine, Ser: 1.01 mg/dL (ref 0.40–1.50)
GFR: 81.14 mL/min (ref >60.00)
Glucose, Bld: 94 mg/dL (ref 70–99)
Potassium: 5 mEq/L (ref 3.5–5.1)
Sodium: 138 mEq/L (ref 135–145)

## 2022-08-28 LAB — LIPID PANEL
Cholesterol: 155 mg/dL (ref 0–200)
HDL: 44.8 mg/dL (ref >39.00)
LDL Cholesterol: 85 mg/dL (ref 0–99)
NonHDL: 110.45
Total CHOL/HDL Ratio: 3
Triglycerides: 127 mg/dL (ref 0.0–149.0)
VLDL: 25.4 mg/dL (ref 0.0–40.0)

## 2022-08-28 LAB — URINALYSIS, ROUTINE W REFLEX MICROSCOPIC
Bilirubin Urine: NEGATIVE
Hgb urine dipstick: NEGATIVE
Ketones, ur: NEGATIVE
Leukocytes,Ua: NEGATIVE
Nitrite: NEGATIVE
RBC / HPF: NONE SEEN (ref 0–?)
Specific Gravity, Urine: >=1.03 — AB (ref 1.000–1.030)
Total Protein, Urine: NEGATIVE
Urine Glucose: NEGATIVE
Urobilinogen, UA: 0.2 (ref 0.0–1.0)
WBC, UA: NONE SEEN (ref 0–?)
pH: 5.5 (ref 5.0–8.0)

## 2022-08-28 LAB — VITAMIN B12: Vitamin B-12: 540 pg/mL (ref 211–911)

## 2022-08-28 LAB — HEMOGLOBIN A1C: Hgb A1c MFr Bld: 5.4 % (ref 4.6–6.5)

## 2022-08-28 LAB — VITAMIN D 25 HYDROXY (VIT D DEFICIENCY, FRACTURES): VITD: 26.41 ng/mL — ABNORMAL LOW (ref 30.00–100.00)

## 2022-08-28 LAB — TSH: TSH: 4.26 u[IU]/mL (ref 0.35–5.50)

## 2022-08-28 LAB — HEPATIC FUNCTION PANEL
ALT: 23 U/L (ref 0–53)
AST: 21 U/L (ref 0–37)
Albumin: 3.8 g/dL (ref 3.5–5.2)
Alkaline Phosphatase: 56 U/L (ref 39–117)
Bilirubin, Direct: 0.1 mg/dL (ref 0.0–0.3)
Total Bilirubin: 0.7 mg/dL (ref 0.2–1.2)
Total Protein: 7 g/dL (ref 6.0–8.3)

## 2022-08-28 LAB — PSA: PSA: 1.65 ng/mL (ref 0.10–4.00)

## 2022-08-29 NOTE — Telephone Encounter (Signed)
Patient is requesting call back. He states he was talking to [facility], RN, in regards to possible dates.

## 2022-09-01 DIAGNOSIS — G4733 Obstructive sleep apnea (adult) (pediatric): Secondary | ICD-10-CM | POA: Diagnosis not present

## 2022-09-02 ENCOUNTER — Ambulatory Visit (INDEPENDENT_AMBULATORY_CARE_PROVIDER_SITE_OTHER): Payer: BC Managed Care – PPO | Admitting: Internal Medicine

## 2022-09-02 ENCOUNTER — Encounter: Payer: Self-pay | Admitting: Internal Medicine

## 2022-09-02 VITALS — BP 110/68 | HR 80 | Temp 97.9°F | Ht 76.0 in | Wt 247.0 lb

## 2022-09-02 DIAGNOSIS — E7800 Pure hypercholesterolemia, unspecified: Secondary | ICD-10-CM | POA: Diagnosis not present

## 2022-09-02 DIAGNOSIS — W57XXXA Bitten or stung by nonvenomous insect and other nonvenomous arthropods, initial encounter: Secondary | ICD-10-CM

## 2022-09-02 DIAGNOSIS — Z0001 Encounter for general adult medical examination with abnormal findings: Secondary | ICD-10-CM

## 2022-09-02 DIAGNOSIS — I10 Essential (primary) hypertension: Secondary | ICD-10-CM

## 2022-09-02 DIAGNOSIS — R42 Dizziness and giddiness: Secondary | ICD-10-CM | POA: Diagnosis not present

## 2022-09-02 DIAGNOSIS — R55 Syncope and collapse: Secondary | ICD-10-CM | POA: Diagnosis not present

## 2022-09-02 DIAGNOSIS — E039 Hypothyroidism, unspecified: Secondary | ICD-10-CM

## 2022-09-02 MED ORDER — TELMISARTAN 80 MG PO TABS: 40.0000 mg | ORAL_TABLET | Freq: Every day | ORAL | 3 refills | Status: DC

## 2022-09-02 NOTE — Progress Notes (Unsigned)
Patient ID: Brad Ray, male   DOB: 11/16/1960, 62 y.o.   MRN: [id number]        Chief Complaint:: wellness exam and Annual Exam (Has been feeling shaky and light headed and shortness of breath for about 2 months )  , htn, recent multiple tick bites,  hld, hypothyroidism       HPI:  Brad Ray is a 62 y.o. male here for wellness exam; for shingrix at pharmacy, o/w up to date                        Also with 2 mo onset unusual recurring several times per day lightheadedness dizziness, worse to bend over at the waist with sob.  Pt denies chest pain, increased sob or doe, wheezing, orthopnea, PND, increased LE swelling, palpitations, or syncope.  Has called cardiology who suggested coming here first.  Has appt soon in f/u, and f/u card Ct score ordered.  Did incidentally have a hunting trip results in numerous small tick bites over several days.   Pt denies polydipsia, polyuria, or new focal neuro s/s.    Pt denies fever, wt loss, night sweats, loss of appetite, or other constitutional symptoms   Wt Readings from Last 3 Encounters:  09/02/22 247 lb (112 kg)  06/19/22 267 lb (121.1 kg)  11/19/21 246 lb (111.6 kg)   BP Readings from Last 3 Encounters:  09/02/22 110/68  06/19/22 112/76  11/19/21 130/82   Immunization History  Administered Date(s) Administered   Influenza Whole 01/11/1999   Influenza, Seasonal, Injecte, Preservative Fre 03/13/2012   Influenza,inj,Quad PF,6+ Mos 10/31/2016, 01/20/2018   Tdap 07/17/2017   Health Maintenance Due  Topic Date Due   Zoster Vaccines- Shingrix (1 of 2) Never done      Past Medical History:  Diagnosis Date   Abdominal pain, right upper quadrant 12/09/2007   ALLERGIC RHINITIS 03/19/2007   Allergy    Coronary artery calcification seen on CT scan 04/19/2019   DEGENERATIVE JOINT DISEASE, LUMBAR SPINE 12/23/2006   DISC DISEASE, CERVICAL 12/23/2006   FATIGUE 04/28/2009   GERD 12/23/2006   HYPERCHOLESTEROLEMIA 12/23/2006   HYPERLIPIDEMIA  03/19/2007   HYPERSOMNIA 04/28/2009   HYPERTENSION 03/19/2007   HYPOTENSION 04/13/2009   INSOMNIA, HX OF 12/23/2006   LIVER MASS 12/11/2007   MENIERE'S DISEASE 12/23/2006   OTITIS MEDIA, LEFT 04/13/2009   PERFORATION, TYMPANIC MEMBRANE NOS 12/23/2006   Sleep apnea    uses CPAP   Past Surgical History:  Procedure Laterality Date   COLONOSCOPY     left knee (MCL) repair     s/p left ear surgury for vertigo     s/p left knee medial collateral ligament damage      reports that he has never smoked. He has never used smokeless tobacco. He reports current alcohol use. He reports that he does not use drugs. family history includes Alcohol abuse in an other family member; Diabetes in his father; Heart disease in his father; Hypertension in his father. Allergies  Allergen Reactions   Penicillins     Childhood reaction   Current Outpatient Medications on File Prior to Visit  Medication Sig Dispense Refill   ARMOUR THYROID 90 MG tablet Take 1 tablet (90 mg total) by mouth daily. 90 tablet 3   diazepam (VALIUM) 5 MG tablet TAKE 1 TABLET BY MOUTH EVERY 12 HOURS AS NEEDED FOR ANXIETY 60 tablet 2   methylcellulose oral powder Take by mouth 2 (two) times daily.  Uses PRN     Multiple Vitamin (MULTIVITAMIN) capsule Take 1 capsule by mouth daily.     rosuvastatin (CRESTOR) 5 MG tablet TAKE 1 TABLET (5 MG TOTAL) BY MOUTH DAILY. 90 tablet 3   tadalafil (CIALIS) 20 MG tablet TAKE 1 TABLET BY MOUTH ONCE DAILY AS NEEDED FOR ERECTILE DYSFUNCTION 30 tablet 0   vitamin B-12 (CYANOCOBALAMIN) 500 MCG tablet Take 500 mcg by mouth daily.     vitamin C (ASCORBIC ACID) 500 MG tablet Take 1,000 mg by mouth daily.     VITAMIN D PO Take by mouth daily.     zolpidem (AMBIEN) 10 MG tablet TAKE 1 TABLET BY MOUTH EVERY DAY AT BEDTIME AS NEEDED 90 tablet 1   No current facility-administered medications on file prior to visit.        ROS:  All others reviewed and negative.  Objective        PE:  BP 110/68 (BP  Location: Left Arm, Patient Position: Sitting, Cuff Size: Normal)   Pulse 80   Temp 97.9 F (36.6 C) (Oral)   Ht 6\' 4"  (1.93 m)   Wt 247 lb (112 kg)   SpO2 96%   BMI 30.07 kg/m                 Constitutional: Pt appears in NAD               HENT: Head: NCAT.                Right Ear: External ear normal.                 Left Ear: External ear normal.                Eyes: . Pupils are equal, round, and reactive to light. Conjunctivae and EOM are normal               Nose: without d/c or deformity               Neck: Neck supple. Gross normal ROM               Cardiovascular: Normal rate and regular rhythm.                 Pulmonary/Chest: Effort normal and breath sounds without rales or wheezing.                Abd:  Soft, NT, ND, + BS, no organomegaly               Neurological: Pt is alert. At baseline orientation, motor grossly intact               Skin: Skin is warm. No rashes, no other new lesions, LE edema - none               Psychiatric: Pt behavior is normal without agitation   Micro: none  Cardiac tracings I have personally interpreted today:  none  Pertinent Radiological findings (summarize): none   Lab Results  Component Value Date   WBC 5.7 08/28/2022   HGB 15.1 08/28/2022   HCT 45.9 08/28/2022   PLT 259.0 08/28/2022   GLUCOSE 94 08/28/2022   CHOL 155 08/28/2022   TRIG 127.0 08/28/2022   HDL 44.80 08/28/2022   LDLDIRECT 131.0 03/18/2019   LDLCALC 85 08/28/2022   ALT 23 08/28/2022   AST 21 08/28/2022   NA 138 08/28/2022   K 5.0 08/28/2022   CL  104 08/28/2022   CREATININE 1.01 08/28/2022   BUN 14 08/28/2022   CO2 26 08/28/2022   TSH 4.26 08/28/2022   PSA 1.65 08/28/2022   HGBA1C 5.4 08/28/2022   MICROALBUR 1.3 08/03/2015   Assessment/Plan:  Brad Ray is a 62 y.o. White or Caucasian [1] male with  has a past medical history of Abdominal pain, right upper quadrant (12/09/2007), ALLERGIC RHINITIS (03/19/2007), Allergy, Coronary artery calcification  seen on CT scan (04/19/2019), DEGENERATIVE JOINT DISEASE, LUMBAR SPINE (12/23/2006), DISC DISEASE, CERVICAL (12/23/2006), FATIGUE (04/28/2009), GERD (12/23/2006), HYPERCHOLESTEROLEMIA (12/23/2006), HYPERLIPIDEMIA (03/19/2007), HYPERSOMNIA (04/28/2009), HYPERTENSION (03/19/2007), HYPOTENSION (04/13/2009), INSOMNIA, HX OF (12/23/2006), LIVER MASS (12/11/2007), MENIERE'S DISEASE (12/23/2006), OTITIS MEDIA, LEFT (04/13/2009), PERFORATION, TYMPANIC MEMBRANE NOS (12/23/2006), and Sleep apnea.  Encounter for well adult exam with abnormal findings Age and sex appropriate education and counseling updated with regular exercise and diet Referrals for preventative services - none needed Immunizations addressed - for shingrix at pharmacy Smoking counseling  - none needed Evidence for depression or other mood disorder - none significant Most recent labs reviewed. I have personally reviewed and have noted: 1) the patient's medical and social history 2) The patient's current medications and supplements 3) The patient's height, weight, and BMI have been recorded in the chart   Hyperlipidemia Lab Results  Component Value Date   LDLCALC 85 08/28/2022   Uncontrolled, goal ldl < 70, pt to continue current crestor 5 mg, declines change for now, plans to f/u with cardiology as above   Essential hypertension BP Readings from Last 3 Encounters:  09/02/22 110/68  06/19/22 112/76  11/19/21 130/82   I suspect mild overcontrolled, ok for reduced dose - micardis 80 mg - 1/2 qd   Hypothyroidism Lab Results  Component Value Date   TSH 4.26 08/28/2022   Stable, pt to continue armour thyroid 90 mg    Tick bite For tick serology,  to f/u any worsening symptoms or concerns   Dizzy Possible due to overcontrolled bp, also for cortisol level, carotid dopplers and echo, flu cardiology as planned,   Followup: Return in about 6 months (around 03/04/2023).  Oliver Barre, MD 09/03/2022 12:51 PM [facility]

## 2022-09-02 NOTE — Patient Instructions (Signed)
Ok to reduce the micardis to HALF (40 mg per day)  Please continue all other medications as before, and refills have been done if requested.  Please have the pharmacy call with any other refills you may need.  Please continue your efforts at being more active, low cholesterol diet, and weight control.  You are otherwise up to date with prevention measures today.  Please keep your appointments with your specialists as you may have planned - cardiology and cardiac CT score soon  You will be contacted regarding the referral for: Echo, and Carotids  Please go to the LAB at the blood drawing area for the tests to be done  You will be contacted by phone if any changes need to be made immediately.  Otherwise, you will receive a letter about your results with an explanation, but please check with MyChart first.  Please remember to sign up for MyChart if you have not done so, as this will be important to you in the future with finding out test results, communicating by private email, and scheduling acute appointments online when needed.  Please make an Appointment to return in 6 months, or sooner if needed

## 2022-09-03 ENCOUNTER — Other Ambulatory Visit: Payer: Self-pay | Admitting: Internal Medicine

## 2022-09-03 ENCOUNTER — Encounter: Payer: Self-pay | Admitting: Internal Medicine

## 2022-09-03 DIAGNOSIS — W57XXXA Bitten or stung by nonvenomous insect and other nonvenomous arthropods, initial encounter: Secondary | ICD-10-CM | POA: Insufficient documentation

## 2022-09-03 DIAGNOSIS — R42 Dizziness and giddiness: Secondary | ICD-10-CM | POA: Insufficient documentation

## 2022-09-03 NOTE — Assessment & Plan Note (Signed)
For tick serology,  to f/u any worsening symptoms or concerns

## 2022-09-03 NOTE — Assessment & Plan Note (Addendum)
Possible due to overcontrolled bp, also for cortisol level, carotid dopplers and echo, flu cardiology as planned,

## 2022-09-03 NOTE — Assessment & Plan Note (Signed)

## 2022-09-03 NOTE — Assessment & Plan Note (Signed)
Lab Results  Component Value Date   TSH 4.26 08/28/2022   Stable, pt to continue armour thyroid 90 mg

## 2022-09-03 NOTE — Assessment & Plan Note (Signed)
Lab Results  Component Value Date   LDLCALC 85 08/28/2022   Uncontrolled, goal ldl < 70, pt to continue current crestor 5 mg, declines change for now, plans to f/u with cardiology as above

## 2022-09-03 NOTE — Assessment & Plan Note (Signed)
BP Readings from Last 3 Encounters:  09/02/22 110/68  06/19/22 112/76  11/19/21 130/82   I suspect mild overcontrolled, ok for reduced dose - micardis 80 mg - 1/2 qd

## 2022-09-05 NOTE — Telephone Encounter (Signed)
Patient has CT scheduled prior to his office visit.

## 2022-09-12 NOTE — Progress Notes (Signed)
CARDIOLOGY CONSULT NOTE       Patient ID: Brad Ray MRN: [id number] DOB/AGE: 1960-09-03 62 y.o.  Referring Physician: Jonny Ruiz Primary Physician: Corwin Levins, MD    HPI: 62 y.o. first seen at request of Dr Jonny Ruiz 11/10/19 for tachycardia. History of OSA and bad COVID infection August 2021 Received monoclonal antibody and f/U CT fortunately 01/11/20 showed good resolution of multi lobar pneumonia. 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 Cardiac CTA 04/16/19 with calcium score 70 which was 72 nd percentile for age and sex. He had been non compliant with statin and restarted He was on armor thyroid and T4 normal with suppressed TSH    Seen in ED 10/2021 for left sided Otalgia given cipro drops  History of colon polyps sees [facility] Had small sessile cecal polyp  Removed 03/2021   10/23/20 LDL 99  TSH 0.291   Wife with alpha gal and some allergy issues Son Loyal Jacobson is still doing fitness work Daughter having 3 rd child     Calcium Score 09/20/22 reviewed  217 , 80 th percentile ETT with no ischemic changes but had drop in blood pressure during exercise   Seen by primary 09/02/22 2 months lightheadedness, dizziness and dyspnea Has had recent hunting trip with multiple tick bites BP 110/68 and pulse 90 during office visit Micardis dose reduced to 40 mg 1/2 tab daily TSH 4.26 Pending tick serology, carotids echo and labs   He complains of fatigue and dyspnea especially walking up hill [place] and the hill is difficult for him He does not run much due to knee issues No issues lifting weights Denies Cialis or other drugs prior to stress test. He is not sure reading was accurate during stress test as he had no symptoms. No chest pain Micardis dose decreased by primary for some postural symptoms TSH cortisol and tick serology pending   Shared decision making discussed 44mm Aorta on TTE and hypotension  with stress in absence of symptoms or ST changes Favor gated cardiac CTA scan from clavicles down to r/o obstructive CAD/LM and size aorta  Also discussed changing his BP med to norvasc 5 mg as the mycardis may contribute to postural symptoms  he has chronic inner ear issues especially at night when he loses his visual que's in the dark. Cannot hear hardly at all in left ear   ROS All other systems reviewed and negative except as noted above  Past Medical History:  Diagnosis Date   Abdominal pain, right upper quadrant 12/09/2007   ALLERGIC RHINITIS 03/19/2007   Allergy    Coronary artery calcification seen on CT scan 04/19/2019   DEGENERATIVE JOINT DISEASE, LUMBAR SPINE 12/23/2006   DISC DISEASE, CERVICAL 12/23/2006   FATIGUE 04/28/2009   GERD 12/23/2006   HYPERCHOLESTEROLEMIA 12/23/2006   HYPERLIPIDEMIA 03/19/2007   HYPERSOMNIA 04/28/2009   HYPERTENSION 03/19/2007   HYPOTENSION 04/13/2009   INSOMNIA, HX OF 12/23/2006   LIVER MASS 12/11/2007   MENIERE'S DISEASE 12/23/2006   OTITIS MEDIA, LEFT 04/13/2009   PERFORATION, TYMPANIC MEMBRANE NOS 12/23/2006   Sleep apnea    uses CPAP    Family History  Problem Relation Age of Onset   Diabetes Father    Hypertension Father    Heart disease Father    Alcohol abuse Other    Colon cancer Neg Hx    Pancreatic cancer Neg Hx  Stomach cancer Neg Hx    Esophageal cancer Neg Hx    Rectal cancer Neg Hx     Social History   Socioeconomic History   Marital status: Married    Spouse name: Not on file   Number of children: 1   Years of education: Not on file   Highest education level: Not on file  Occupational History   Not on file  Tobacco Use   Smoking status: Never   Smokeless tobacco: Never  Vaping Use   Vaping status: Never Used  Substance and Sexual Activity   Alcohol use: Yes    Comment: weekly beer   Drug use: No   Sexual activity: Not on file  Other Topics Concern   Not on file  Social History Narrative   1 child  [place]   Social Determinants of Health   Financial Resource Strain: Not on file  Food Insecurity: Not on file  Transportation Needs: Not on file  Physical Activity: Not on file  Stress: Not on file  Social Connections: Not on file  Intimate Partner Violence: Not on file    Past Surgical History:  Procedure Laterality Date   COLONOSCOPY     left knee (MCL) repair     s/p left ear surgury for vertigo     s/p left knee medial collateral ligament damage        Current Outpatient Medications:    amLODipine (NORVASC) 5 MG tablet, Take 1 tablet (5 mg total) by mouth daily., Disp: 90 tablet, Rfl: 3   metoprolol tartrate (LOPRESSOR) 50 MG tablet, Take one tablet by mouth 2 hours prior to CT., Disp: 1 tablet, Rfl: 0   ARMOUR THYROID 90 MG tablet, Take 1 tablet (90 mg total) by mouth daily., Disp: 90 tablet, Rfl: 3   diazepam (VALIUM) 5 MG tablet, TAKE 1 TABLET BY MOUTH EVERY 12 HOURS AS NEEDED FOR ANXIETY, Disp: 60 tablet, Rfl: 2   methylcellulose oral powder, Take by mouth 2 (two) times daily. Uses PRN, Disp: , Rfl:    Multiple Vitamin (MULTIVITAMIN) capsule, Take 1 capsule by mouth daily., Disp: , Rfl:    rosuvastatin (CRESTOR) 10 MG tablet, Take 1 tablet (10 mg total) by mouth daily., Disp: 90 tablet, Rfl: 3   tadalafil (CIALIS) 20 MG tablet, TAKE 1 TABLET BY MOUTH ONCE DAILY AS NEEDED FOR ERECTILE DYSFUNCTION, Disp: 30 tablet, Rfl: 0   vitamin B-12 (CYANOCOBALAMIN) 500 MCG tablet, Take 500 mcg by mouth daily., Disp: , Rfl:    vitamin C (ASCORBIC ACID) 500 MG tablet, Take 1,000 mg by mouth daily., Disp: , Rfl:    VITAMIN D PO, Take by mouth daily., Disp: , Rfl:    zolpidem (AMBIEN) 10 MG tablet, TAKE 1 TABLET BY MOUTH EVERY DAY AT BEDTIME AS NEEDED, Disp: 90 tablet, Rfl: 1     Physical Exam: Blood pressure 128/78, pulse 76, height 6\' 3"  (1.905 m), weight 244 lb (110.7 kg), SpO2 97%.    Affect appropriate Healthy:  appears stated age HEENT: normal Neck supple with no  adenopathy JVP normal no bruits no thyromegaly Lungs clear with no wheezing and good diaphragmatic motion Heart:  S1/S2 no murmur, no rub, gallop or click PMI normal Abdomen: benighn, BS positve, no tenderness, no AAA no bruit.  No HSM or HJR Distal pulses intact with no bruits No edema Neuro non-focal Skin warm and dry No muscular weakness   Labs:   Lab Results  Component Value Date   WBC  5.7 08/28/2022   HGB 15.1 08/28/2022   HCT 45.9 08/28/2022   MCV 90.3 08/28/2022   PLT 259.0 08/28/2022   No results for input(s): "NA", "K", "CL", "CO2", "BUN", "CREATININE", "CALCIUM", "PROT", "BILITOT", "ALKPHOS", "ALT", "AST", "GLUCOSE" in the last 168 hours.  Invalid input(s): "LABALBU" No results found for: "CKTOTAL", "CKMB", "CKMBINDEX", "TROPONINI"  Lab Results  Component Value Date   CHOL 155 08/28/2022   CHOL 167 10/24/2021   CHOL 176 10/23/2020   Lab Results  Component Value Date   HDL 44.80 08/28/2022   HDL 51 10/24/2021   HDL 53.00 10/23/2020   Lab Results  Component Value Date   LDLCALC 85 08/28/2022   LDLCALC 93 10/24/2021   LDLCALC 99 10/23/2020   Lab Results  Component Value Date   TRIG 127.0 08/28/2022   TRIG 128 10/24/2021   TRIG 120.0 10/23/2020   Lab Results  Component Value Date   CHOLHDL 3 08/28/2022   CHOLHDL 3.3 10/24/2021   CHOLHDL 3 10/23/2020   Lab Results  Component Value Date   LDLDIRECT 131.0 03/18/2019   LDLDIRECT 133.0 07/15/2017   LDLDIRECT 173.7 03/17/2013      Radiology: ECHOCARDIOGRAM COMPLETE  Result Date: 09/24/2022    ECHOCARDIOGRAM REPORT   Patient Name:   MALACKI VENIER Date of Exam: 09/24/2022 Medical Rec #:  [id number]       Height:       76.0 in Accession #:    [id number]      Weight:       247.0 lb Date of Birth:  06/23/1960       BSA:          2.423 m Patient Age:    62 years        BP:           110/68 mmHg Patient Gender: M               HR:           74 bpm. Exam Location:  [address] Procedure: 2D Echo, Cardiac  Doppler, Color Doppler and Intracardiac            Opacification Agent Indications:    Syncope R55  History:        Patient has prior history of Echocardiogram examinations, most                 recent 11/11/2019. Risk Factors:Hypertension and Dyslipidemia.  Sonographer:    Thurman Coyer RDCS Referring Phys: [id number] [place] IMPRESSIONS  1. Left ventricular ejection fraction, by estimation, is 60 to 65%. The left ventricle has normal function. The left ventricle has no regional wall motion abnormalities. Left ventricular diastolic parameters were normal.  2. Right ventricular systolic function is normal. The right ventricular size is mildly enlarged. There is normal pulmonary artery systolic pressure. The estimated right ventricular systolic pressure is 21.5 mmHg.  3. Right atrial size was mildly dilated.  4. The mitral valve is normal in structure. No evidence of mitral valve regurgitation. No evidence of mitral stenosis.  5. The aortic valve is normal in structure. Aortic valve regurgitation is not visualized. No aortic stenosis is present.  6. Aortic dilatation noted. There is mild dilatation of the ascending aorta, measuring 44 mm.  7. The inferior vena cava is normal in size with greater than 50% respiratory variability, suggesting right atrial pressure of 3 mmHg. FINDINGS  Left Ventricle: Left ventricular ejection fraction, by estimation, is 60 to 65%. The left  ventricle has normal function. The left ventricle has no regional wall motion abnormalities. Definity contrast agent was given IV to delineate the left ventricular  endocardial borders. The left ventricular internal cavity size was normal in size. There is no left ventricular hypertrophy. Left ventricular diastolic parameters were normal. Normal left ventricular filling pressure. Right Ventricle: The right ventricular size is mildly enlarged. No increase in right ventricular wall thickness. Right ventricular systolic function is normal. There is  normal pulmonary artery systolic pressure. The tricuspid regurgitant velocity is 2.15  m/s, and with an assumed right atrial pressure of 3 mmHg, the estimated right ventricular systolic pressure is 21.5 mmHg. Left Atrium: Left atrial size was normal in size. Right Atrium: Right atrial size was mildly dilated. Pericardium: There is no evidence of pericardial effusion. Mitral Valve: The mitral valve is normal in structure. No evidence of mitral valve regurgitation. No evidence of mitral valve stenosis. Tricuspid Valve: The tricuspid valve is normal in structure. Tricuspid valve regurgitation is mild . No evidence of tricuspid stenosis. Aortic Valve: The aortic valve is normal in structure. Aortic valve regurgitation is not visualized. No aortic stenosis is present. Pulmonic Valve: The pulmonic valve was normal in structure. Pulmonic valve regurgitation is not visualized. No evidence of pulmonic stenosis. Aorta: Aortic dilatation noted. There is mild dilatation of the ascending aorta, measuring 44 mm. Venous: The inferior vena cava is normal in size with greater than 50% respiratory variability, suggesting right atrial pressure of 3 mmHg. IAS/Shunts: No atrial level shunt detected by color flow Doppler.  LEFT VENTRICLE PLAX 2D LVIDd:         5.20 cm   Diastology LVIDs:         3.40 cm   LV e' medial:    7.62 cm/s LV PW:         1.10 cm   LV E/e' medial:  5.6 LV IVS:        1.00 cm   LV e' lateral:   11.10 cm/s LVOT diam:     2.60 cm   LV E/e' lateral: 3.9 LV SV:         83 LV SV Index:   34 LVOT Area:     5.31 cm  RIGHT VENTRICLE RV Basal diam:  4.10 cm RV Mid diam:    3.90 cm RV S prime:     13.30 cm/s TAPSE (M-mode): 2.5 cm LEFT ATRIUM             Index        RIGHT ATRIUM           Index LA diam:        3.40 cm 1.40 cm/m   RA Area:     24.30 cm LA Vol (A2C):   49.9 ml 20.59 ml/m  RA Volume:   88.40 ml  36.48 ml/m LA Vol (A4C):   40.9 ml 16.88 ml/m LA Biplane Vol: 46.0 ml 18.98 ml/m  AORTIC VALVE LVOT Vmax:    85.00 cm/s LVOT Vmean:  53.000 cm/s LVOT VTI:    0.156 m  AORTA Ao Root diam: 3.50 cm Ao Asc diam:  4.40 cm MITRAL VALVE               TRICUSPID VALVE MV Area (PHT): 2.87 cm    TR Peak grad:   18.5 mmHg MV Decel Time: 264 msec    TR Vmax:        215.00 cm/s MV E velocity: 42.90 cm/s MV A velocity:  45.30 cm/s  SHUNTS MV E/A ratio:  0.95        Systemic VTI:  0.16 m                            Systemic Diam: 2.60 cm Armanda Magic MD Electronically signed by Armanda Magic MD Signature Date/Time: 09/24/2022/3:55:47 PM    Final    EXERCISE TOLERANCE TEST (ETT)  Result Date: 09/24/2022   No ST deviation was noted. Abnormal ECG treadmill stress test due to hypotension at peak exercise, otherwise normal study. No ischemic ECG changes are seen with exercise.   CT CARDIAC SCORING (SELF PAY ONLY)  Result Date: 09/20/2022 CLINICAL DATA:  25M for cardiovascular disease risk stratification EXAM: Coronary Calcium Score TECHNIQUE: A gated, non-contrast computed tomography scan of the heart was performed using 3mm slice thickness. Axial images were analyzed on a dedicated workstation. Calcium scoring of the coronary arteries was performed using the Agatston method. FINDINGS: Coronary Calcium Score: Left main: 0 Left anterior descending artery: 148 Left circumflex artery: 48.3 Right coronary artery: 20.6 Total: 217 Percentile: 80th Pericardium: Normal. Ascending aorta mildly dilated.  4.0 cm. Non-cardiac: See separate report from [facility]. IMPRESSION: 1. Coronary calcium score of 217. This was 80th percentile for age-, race-, and sex-matched controls. 2. Ascending aorta mildly dilated.  4.0 cm. RECOMMENDATIONS: Coronary artery calcium (CAC) score is a strong predictor of incident coronary heart disease (CHD) and provides predictive information beyond traditional risk factors. CAC scoring is reasonable to use in the decision to withhold, postpone, or initiate statin therapy in intermediate-risk or selected  borderline-risk asymptomatic adults (age 51-75 years and LDL-C >=70 to <190 mg/dL) who do not have diabetes or established atherosclerotic cardiovascular disease (ASCVD).* In intermediate-risk (10-year ASCVD risk >=7.5% to <20%) adults or selected borderline-risk (10-year ASCVD risk >=5% to <7.5%) adults in whom a CAC score is measured for the purpose of making a treatment decision the following recommendations have been made: If CAC=0, it is reasonable to withhold statin therapy and reassess in 5 to 10 years, as long as higher risk conditions are absent (diabetes mellitus, family history of premature CHD in first degree relatives (males <55 years; females <65 years), cigarette smoking, or LDL >=190 mg/dL). If CAC is 1 to 99, it is reasonable to initiate statin therapy for patients >=47 years of age. If CAC is >=100 or >=75th percentile, it is reasonable to initiate statin therapy at any age. Cardiology referral should be considered for patients with CAC scores >=400 or >=75th percentile. *2018 AHA/ACC/AACVPR/AAPA/ABC/ACPM/ADA/AGS/APhA/ASPC/NLA/PCNA Guideline on the Management of Blood Cholesterol: A Report of the American College of Cardiology/American Heart Association Task Force on Clinical Practice Guidelines. J Am Coll Cardiol. 2019;73(24):3168-3209. Chilton Si, MD Electronically Signed   By: Chilton Si M.D.   On: 09/20/2022 16:47   VAS US CAROTID  Result Date: 09/17/2022 Carotid Arterial Duplex Study Patient Name:  CALON FILAR  Date of Exam:   09/16/2022 Medical Rec #: [id number]        Accession #:    [id number] Date of Birth: 1960/09/17        Patient Gender: M Patient Age:   37 years Exam Location:  [facility] Procedure:      VAS US CAROTID Referring Phys: Oliver Barre --------------------------------------------------------------------------------  Indications:   Syncope per order. Patient reports lightheadedness when coming up                from bending position or  when standing up too  quickly x 2 months.                He denies any other cerebrovascular symptoms. Risk Factors:  Hypertension, hyperlipidemia, no history of smoking. Other Factors: Covid-19. Performing Technologist: Tyna Jaksch RVT  Examination Guidelines: A complete evaluation includes B-mode imaging, spectral Doppler, color Doppler, and power Doppler as needed of all accessible portions of each vessel. Bilateral testing is considered an integral part of a complete examination. Limited examinations for reoccurring indications may be performed as noted.  Right Carotid Findings: +----------+--------+--------+--------+------------------+--------+           PSV cm/sEDV cm/sStenosisPlaque DescriptionComments +----------+--------+--------+--------+------------------+--------+ CCA Prox  112     21                                         +----------+--------+--------+--------+------------------+--------+ CCA Distal106     21                                         +----------+--------+--------+--------+------------------+--------+ ICA Prox  80      17      Normal                             +----------+--------+--------+--------+------------------+--------+ ICA Distal68      21                                         +----------+--------+--------+--------+------------------+--------+ ECA       102     13                                         +----------+--------+--------+--------+------------------+--------+ +----------+--------+-------+----------------+-------------------+           PSV cm/sEDV cmsDescribe        Arm Pressure (mmHG) +----------+--------+-------+----------------+-------------------+ Subclavian101            Multiphasic, [id number]                 +----------+--------+-------+----------------+-------------------+ +---------+--------+--+--------+--+---------+ VertebralPSV cm/s44EDV cm/s12Antegrade +---------+--------+--+--------+--+---------+  Left Carotid  Findings: +----------+--------+--------+--------+------------------+--------+           PSV cm/sEDV cm/sStenosisPlaque DescriptionComments +----------+--------+--------+--------+------------------+--------+ CCA Prox  120     23                                         +----------+--------+--------+--------+------------------+--------+ CCA Distal92      28                                         +----------+--------+--------+--------+------------------+--------+ ICA Prox  86      26      Normal                             +----------+--------+--------+--------+------------------+--------+ ICA Distal63      25                                         +----------+--------+--------+--------+------------------+--------+  ECA       105     13                                         +----------+--------+--------+--------+------------------+--------+ +----------+--------+--------+----------------+-------------------+           PSV cm/sEDV cm/sDescribe        Arm Pressure (mmHG) +----------+--------+--------+----------------+-------------------+ [id number]             Multiphasic, [id number]                 +----------+--------+--------+----------------+-------------------+ +---------+--------+--+--------+--+---------+ VertebralPSV cm/s37EDV cm/s12Antegrade +---------+--------+--+--------+--+---------+   Summary: Right Carotid: There is no evidence of stenosis in the right ICA. There was no                evidence of thrombus, dissection, atherosclerotic plaque or                stenosis in the cervical carotid system. Left Carotid: There is no evidence of stenosis in the left ICA. The extracranial               vessels were near-normal with only minimal wall thickening or               plaque. Vertebrals:  Bilateral vertebral arteries demonstrate antegrade flow. Subclavians: Normal flow hemodynamics were seen in bilateral subclavian              arteries. *See  table(s) above for measurements and observations.  Electronically signed by Charlton Haws MD on 09/17/2022 at 8:32:54 AM.    Final     EKG: 03/25/19 SR normal ECG 09/26/2022 SR rate 67 normal    ASSESSMENT AND PLAN:   1. COVID:  Improved CT 01/11/20 with resolution of interstitial changes  Sed Rate and C reactive protein normal November 10 2019   2. Tachycardia:  Improved ? Related to anxiety and COVID ECG normal 48 hr monitor never done will defer at this point   3. CAD: no chest pain high calcium score for age 36 which is 58 th percentile for age and sex ASA/Statin Hypotension with exercise with no symptoms or ST changes Cardiac CTA will be ordered   4. HTN: ? Micardis contributing to postural symptoms labs with primary pending including TSH/cortisol tick serology Add norvasc 5 mg daily  5. HLD: on crestor  LDL 85 08/28/22 Only taking 5 mg Crestor since 05/03/22     6. Thyroid:  On Armour Thyroid see above dosing per primary TSH normal  7. DM:  Discussed low carb diet.  Target hemoglobin A1c is 6.5 or less.  Continue current medications.   8. Dizziness/Dyspnea:  EF normal see above r/o CAD change ARB to norvasc 5 mg and see if this helps   Cardiac CTA scan clavicles down with beta blocker and nitroglycerin to size aorta as well Lopressor 50 mg 2 hours before study Norvasc 5 mg D/C Micradis  F/U post CTA   Signed: Charlton Haws 09/26/2022, 10:19 AM

## 2022-09-16 ENCOUNTER — Ambulatory Visit (HOSPITAL_COMMUNITY)
Admission: RE | Admit: 2022-09-16 | Discharge: 2022-09-16 | Disposition: A | Discharge status: Home / Self Care | Payer: BC Managed Care – PPO | Source: Ambulatory Visit | Attending: Internal Medicine | Admitting: Internal Medicine

## 2022-09-16 DIAGNOSIS — R55 Syncope and collapse: Secondary | ICD-10-CM | POA: Diagnosis not present

## 2022-09-20 ENCOUNTER — Ambulatory Visit (HOSPITAL_COMMUNITY)
Admission: RE | Admit: 2022-09-20 | Discharge: 2022-09-20 | Disposition: A | Discharge status: Home / Self Care | Payer: BC Managed Care – PPO | Source: Ambulatory Visit | Attending: Cardiovascular Disease | Admitting: Cardiovascular Disease

## 2022-09-20 DIAGNOSIS — I2510 Atherosclerotic heart disease of native coronary artery without angina pectoris: Secondary | ICD-10-CM | POA: Insufficient documentation

## 2022-09-23 ENCOUNTER — Telehealth: Payer: Self-pay

## 2022-09-23 ENCOUNTER — Other Ambulatory Visit: Payer: Self-pay | Admitting: Cardiovascular Disease

## 2022-09-23 DIAGNOSIS — I2510 Atherosclerotic heart disease of native coronary artery without angina pectoris: Secondary | ICD-10-CM

## 2022-09-23 DIAGNOSIS — R0600 Dyspnea, unspecified: Secondary | ICD-10-CM

## 2022-09-23 DIAGNOSIS — R55 Syncope and collapse: Secondary | ICD-10-CM

## 2022-09-23 DIAGNOSIS — R931 Abnormal findings on diagnostic imaging of heart and coronary circulation: Secondary | ICD-10-CM

## 2022-09-23 MED ORDER — ROSUVASTATIN CALCIUM 10 MG PO TABS: 10.0000 mg | ORAL_TABLET | Freq: Every day | ORAL | 3 refills | Status: DC

## 2022-09-23 NOTE — Telephone Encounter (Signed)
Called patient with results. Will place order for ETT.

## 2022-09-23 NOTE — Telephone Encounter (Signed)
-----   Message from Charlton Haws sent at 09/23/2022  8:00 AM EDT ----- Calcium score higher than 3 years ago both in absolute value and percentile increase crestor to 10 mg daily and arrange him to have ETT treadmill test as baseline

## 2022-09-24 ENCOUNTER — Ambulatory Visit (HOSPITAL_COMMUNITY): Payer: BC Managed Care – PPO | Attending: Internal Medicine

## 2022-09-24 ENCOUNTER — Other Ambulatory Visit: Payer: Self-pay | Admitting: Cardiovascular Disease

## 2022-09-24 ENCOUNTER — Ambulatory Visit (INDEPENDENT_AMBULATORY_CARE_PROVIDER_SITE_OTHER): Payer: BC Managed Care – PPO

## 2022-09-24 DIAGNOSIS — I2510 Atherosclerotic heart disease of native coronary artery without angina pectoris: Secondary | ICD-10-CM | POA: Diagnosis not present

## 2022-09-24 DIAGNOSIS — R55 Syncope and collapse: Secondary | ICD-10-CM

## 2022-09-24 DIAGNOSIS — R931 Abnormal findings on diagnostic imaging of heart and coronary circulation: Secondary | ICD-10-CM

## 2022-09-24 DIAGNOSIS — R0600 Dyspnea, unspecified: Secondary | ICD-10-CM

## 2022-09-24 LAB — ECHOCARDIOGRAM COMPLETE
Area-P 1/2: 2.87 cm2
S' Lateral: 3.4 cm

## 2022-09-24 LAB — EXERCISE TOLERANCE TEST
Angina Index: 0
Duke Treadmill Score: 9
Estimated workload: 10.7
Exercise duration (min): 9 min
Exercise duration (sec): 24 s
MPHR: 160 {beats}/min
Peak HR: 139 {beats}/min
Percent HR: 86 %
RPE: 13
Rest HR: 78 {beats}/min
ST Depression (mm): 0 mm

## 2022-09-24 MED ORDER — PERFLUTREN LIPID MICROSPHERE
1.0000 mL | INTRAVENOUS | Status: AC | PRN
Start: 2022-09-24 — End: 2022-09-24
  Administered 2022-09-24: 2 mL via INTRAVENOUS

## 2022-09-25 ENCOUNTER — Telehealth: Payer: Self-pay

## 2022-09-25 DIAGNOSIS — R0600 Dyspnea, unspecified: Secondary | ICD-10-CM

## 2022-09-25 DIAGNOSIS — E785 Hyperlipidemia, unspecified: Secondary | ICD-10-CM

## 2022-09-25 DIAGNOSIS — R931 Abnormal findings on diagnostic imaging of heart and coronary circulation: Secondary | ICD-10-CM

## 2022-09-25 DIAGNOSIS — I2510 Atherosclerotic heart disease of native coronary artery without angina pectoris: Secondary | ICD-10-CM

## 2022-09-25 DIAGNOSIS — R55 Syncope and collapse: Secondary | ICD-10-CM

## 2022-09-25 NOTE — Telephone Encounter (Signed)
-----   Message from Charlton Haws sent at 09/25/2022  9:07 AM EDT ----- Called patient on schedule for tomorrow will need gated cardiac CTA to size aortic aneurysm and r/o obstructive CAD with beta blocker/ and nitroglycerin scan from clavicles down Will get BMET during office visit

## 2022-09-25 NOTE — Telephone Encounter (Signed)
Contacted CT navigator and she recommend ordering an IMG 2417 + IMG [id number] .. .this is a cor CTA + CTA Angio chest aorta. Placed order will get lab work and give instructions tomorrow at patient's office visit.

## 2022-09-26 ENCOUNTER — Ambulatory Visit: Payer: BC Managed Care – PPO | Attending: Cardiovascular Disease | Admitting: Cardiovascular Disease

## 2022-09-26 VITALS — BP 128/78 | HR 76 | Ht 75.0 in | Wt 244.0 lb

## 2022-09-26 DIAGNOSIS — E782 Mixed hyperlipidemia: Secondary | ICD-10-CM

## 2022-09-26 DIAGNOSIS — I10 Essential (primary) hypertension: Secondary | ICD-10-CM | POA: Diagnosis not present

## 2022-09-26 DIAGNOSIS — I2510 Atherosclerotic heart disease of native coronary artery without angina pectoris: Secondary | ICD-10-CM | POA: Diagnosis not present

## 2022-09-26 MED ORDER — METOPROLOL TARTRATE 50 MG PO TABS: ORAL_TABLET | ORAL | 0 refills | Status: AC

## 2022-09-26 MED ORDER — AMLODIPINE BESYLATE 5 MG PO TABS: 5.0000 mg | ORAL_TABLET | Freq: Every day | ORAL | 3 refills | Status: DC

## 2022-09-26 NOTE — Patient Instructions (Addendum)
Medication Instructions:  Your physician has recommended you make the following change in your medication:   -STOP Telmisartan ( Micardis )  -START Amlodipine ( Norvasc ) 5 mg by mouth daily.  *If you need a refill on your cardiac medications before your next appointment, please call your pharmacy*  Lab Work: Your physician recommends that you have lab work today- BMET  If you have labs (blood work) drawn today and your tests are completely normal, you will receive your results only by: MyChart Message (if you have MyChart) OR A paper copy in the mail If you have any lab test that is abnormal or we need to change your treatment, we will call you to review the results.  Testing/Procedures: Your physician has requested that you have cardiac CT. Cardiac computed tomography (CT) is a painless test that uses an x-ray machine to take clear, detailed pictures of your heart. For further information please visit [email or web address] Please follow instruction sheet as given.  Follow-Up: At [facility], you and your health needs are our priority.  As part of our continuing mission to provide you with exceptional heart care, we have created designated Provider Care Teams.  These Care Teams include your primary Cardiologist (physician) and Advanced Practice Providers (APPs -  Physician Assistants and Nurse Practitioners) who all work together to provide you with the care you need, when you need it.  We recommend signing up for the patient portal called "MyChart".  Sign up information is provided on this After Visit Summary.  MyChart is used to connect with patients for Virtual Visits (Telemedicine).  Patients are able to view lab/test results, encounter notes, upcoming appointments, etc.  Non-urgent messages can be sent to your provider as well.   To learn more about what you can do with MyChart, go to [email or web address].    Your next appointment:   2 month(s)  Provider:   Charlton Haws, MD       Your cardiac CT will be scheduled at one of the below locations:   [facility] [address] [place], [place] [address] [phone]  If scheduled at [facility], please arrive at the [facility] and Children's Entrance (Entrance C2) of [facility] 30 minutes prior to test start time. You can use the FREE valet parking offered at entrance C (encouraged to control the heart rate for the test)  Proceed to the [facility] Radiology Department (first floor) to check-in and test prep.  All radiology patients and guests should use entrance C2 at [facility], accessed from [facility], even though the hospital's physical address listed is [address]     Please follow these instructions carefully (unless otherwise directed):  An IV will be required for this test and Nitroglycerin will be given.  Hold all erectile dysfunction medications at least 3 days (72 hrs) prior to test. (Ie viagra, cialis, sildenafil, tadalafil, etc)   On the Night Before the Test: Be sure to Drink plenty of water. Do not consume any caffeinated/decaffeinated beverages or chocolate 12 hours prior to your test. Do not take any antihistamines 12 hours prior to your test.  On the Day of the Test: Drink plenty of water until 1 hour prior to the test. Do not eat any food 1 hour prior to test. You may take your regular medications prior to the test.  Take metoprolol (Lopressor) 50 mg two hours prior to test.      After the Test:  Drink plenty of water. After receiving IV contrast, you may experience a mild flushed feeling. This is normal. On occasion, you may experience a mild rash up to 24 hours after the test. This is not dangerous. If this occurs, you can take Benadryl 25 mg and increase your fluid intake. If you experience trouble breathing, this can be serious. If it is severe call 911 IMMEDIATELY. If it is mild, please call our  office.   We will call to schedule your test 2-4 weeks out understanding that some insurance companies will need an authorization prior to the service being performed.   For more information and frequently asked questions, please visit our website : [email or web address]  For non-scheduling related questions, please contact the cardiac imaging nurse navigator should you have any questions/concerns: Cardiac Imaging Nurse Navigators Direct Office Dial: [phone]   For scheduling needs, including cancellations and rescheduling, please call [place], [phone].

## 2022-09-27 LAB — BASIC METABOLIC PANEL
BUN/Creatinine Ratio: 13 (ref 10–24)
BUN: 12 mg/dL (ref 8–27)
CO2: 23 mmol/L (ref 20–29)
Calcium: 9.7 mg/dL (ref 8.6–10.2)
Chloride: 102 mmol/L (ref 96–106)
Creatinine, Ser: 0.96 mg/dL (ref 0.76–1.27)
Glucose: 59 mg/dL — ABNORMAL LOW (ref 70–99)
Potassium: 5.1 mmol/L (ref 3.5–5.2)
Sodium: 137 mmol/L (ref 134–144)
eGFR: 90 mL/min/{1.73_m2} (ref >59)

## 2022-10-03 ENCOUNTER — Telehealth (HOSPITAL_COMMUNITY): Payer: Self-pay | Admitting: Emergency Medicine

## 2022-10-03 NOTE — Telephone Encounter (Signed)
Reaching out to patient to offer assistance regarding upcoming cardiac imaging study; pt verbalizes understanding of appt date/time, parking situation and where to check in, pre-test NPO status and medications ordered, and verified current allergies; name and call back number provided for further questions should they arise Sara Wallace RN Navigator Cardiac Imaging [facility] [phone] office [phone] cell 

## 2022-10-04 ENCOUNTER — Ambulatory Visit (HOSPITAL_COMMUNITY): Admission: RE | Admit: 2022-10-04 | Payer: BC Managed Care – PPO | Source: Ambulatory Visit

## 2022-10-04 DIAGNOSIS — I77810 Thoracic aortic ectasia: Secondary | ICD-10-CM | POA: Diagnosis not present

## 2022-10-04 DIAGNOSIS — E785 Hyperlipidemia, unspecified: Secondary | ICD-10-CM | POA: Insufficient documentation

## 2022-10-04 DIAGNOSIS — I2510 Atherosclerotic heart disease of native coronary artery without angina pectoris: Secondary | ICD-10-CM | POA: Diagnosis not present

## 2022-10-04 DIAGNOSIS — R931 Abnormal findings on diagnostic imaging of heart and coronary circulation: Secondary | ICD-10-CM | POA: Diagnosis not present

## 2022-10-04 DIAGNOSIS — R55 Syncope and collapse: Secondary | ICD-10-CM | POA: Insufficient documentation

## 2022-10-04 DIAGNOSIS — R0600 Dyspnea, unspecified: Secondary | ICD-10-CM | POA: Diagnosis not present

## 2022-10-04 MED ORDER — NITROGLYCERIN 0.4 MG SL SUBL
SUBLINGUAL_TABLET | SUBLINGUAL | Status: AC
  Filled 2022-10-04: qty 2

## 2022-10-04 MED ORDER — IOHEXOL 350 MG/ML SOLN
100.0000 mL | Freq: Once | INTRAVENOUS | Status: AC | PRN
  Administered 2022-10-04: 100 mL via INTRAVENOUS

## 2022-10-04 MED ORDER — NITROGLYCERIN 0.4 MG SL SUBL
0.8000 mg | SUBLINGUAL_TABLET | Freq: Once | SUBLINGUAL | Status: AC
  Administered 2022-10-04: 0.8 mg via SUBLINGUAL

## 2022-10-07 ENCOUNTER — Other Ambulatory Visit (HOSPITAL_COMMUNITY): Payer: BC Managed Care – PPO

## 2022-10-07 ENCOUNTER — Ambulatory Visit (HOSPITAL_COMMUNITY): Payer: BC Managed Care – PPO

## 2022-10-14 ENCOUNTER — Ambulatory Visit: Payer: BC Managed Care – PPO | Admitting: Cardiovascular Disease

## 2022-10-14 ENCOUNTER — Ambulatory Visit (HOSPITAL_COMMUNITY): Payer: BC Managed Care – PPO

## 2022-10-14 ENCOUNTER — Ambulatory Visit (HOSPITAL_COMMUNITY): Admission: RE | Admit: 2022-10-14 | Payer: BC Managed Care – PPO | Source: Ambulatory Visit

## 2022-11-06 ENCOUNTER — Encounter: Payer: Self-pay | Admitting: Internal Medicine

## 2022-11-06 ENCOUNTER — Ambulatory Visit: Payer: BC Managed Care – PPO | Admitting: Internal Medicine

## 2022-11-06 VITALS — BP 122/78 | HR 75 | Temp 97.9°F | Ht 75.0 in | Wt 244.0 lb

## 2022-11-06 DIAGNOSIS — R42 Dizziness and giddiness: Secondary | ICD-10-CM | POA: Diagnosis not present

## 2022-11-06 DIAGNOSIS — R531 Weakness: Secondary | ICD-10-CM | POA: Diagnosis not present

## 2022-11-06 DIAGNOSIS — E162 Hypoglycemia, unspecified: Secondary | ICD-10-CM | POA: Diagnosis not present

## 2022-11-06 DIAGNOSIS — E039 Hypothyroidism, unspecified: Secondary | ICD-10-CM | POA: Diagnosis not present

## 2022-11-06 DIAGNOSIS — R55 Syncope and collapse: Secondary | ICD-10-CM | POA: Diagnosis not present

## 2022-11-06 DIAGNOSIS — R739 Hyperglycemia, unspecified: Secondary | ICD-10-CM

## 2022-11-06 LAB — BASIC METABOLIC PANEL
BUN: 15 mg/dL (ref 6–23)
CO2: 29 mEq/L (ref 19–32)
Calcium: 9.5 mg/dL (ref 8.4–10.5)
Chloride: 103 mEq/L (ref 96–112)
Creatinine, Ser: 0.94 mg/dL (ref 0.40–1.50)
GFR: 88.32 mL/min (ref >60.00)
Glucose, Bld: 87 mg/dL (ref 70–99)
Potassium: 4.4 mEq/L (ref 3.5–5.1)
Sodium: 136 mEq/L (ref 135–145)

## 2022-11-06 NOTE — Progress Notes (Signed)
Patient ID: Brad Ray, male   DOB: 08/23/1960, 62 y.o.   MRN: [id number]        Chief Complaint: follow up ? Low sugars with spells of gen'd weakness - having one right now, htn,        HPI:  Brad Ray is a 62 y.o. male here with c/o        Wt Readings from Last 3 Encounters:  11/06/22 244 lb (110.7 kg)  09/26/22 244 lb (110.7 kg)  09/02/22 247 lb (112 kg)   BP Readings from Last 3 Encounters:  11/06/22 122/78  10/04/22 118/77  09/26/22 128/78         Past Medical History:  Diagnosis Date   Abdominal pain, right upper quadrant 12/09/2007   ALLERGIC RHINITIS 03/19/2007   Allergy    Coronary artery calcification seen on CT scan 04/19/2019   DEGENERATIVE JOINT DISEASE, LUMBAR SPINE 12/23/2006   DISC DISEASE, CERVICAL 12/23/2006   FATIGUE 04/28/2009   GERD 12/23/2006   HYPERCHOLESTEROLEMIA 12/23/2006   HYPERLIPIDEMIA 03/19/2007   HYPERSOMNIA 04/28/2009   HYPERTENSION 03/19/2007   HYPOTENSION 04/13/2009   INSOMNIA, HX OF 12/23/2006   LIVER MASS 12/11/2007   MENIERE'S DISEASE 12/23/2006   OTITIS MEDIA, LEFT 04/13/2009   PERFORATION, TYMPANIC MEMBRANE NOS 12/23/2006   Sleep apnea    uses CPAP   Past Surgical History:  Procedure Laterality Date   COLONOSCOPY     left knee (MCL) repair     s/p left ear surgury for vertigo     s/p left knee medial collateral ligament damage      reports that he has never smoked. He has never used smokeless tobacco. He reports current alcohol use. He reports that he does not use drugs. family history includes Alcohol abuse in an other family member; Diabetes in his father; Heart disease in his father; Hypertension in his father. Allergies  Allergen Reactions   Penicillins     Childhood reaction   Current Outpatient Medications on File Prior to Visit  Medication Sig Dispense Refill   amLODipine (NORVASC) 5 MG tablet Take 1 tablet (5 mg total) by mouth daily. 90 tablet 3   ARMOUR THYROID 90 MG tablet Take 1 tablet (90 mg total)  by mouth daily. 90 tablet 3   diazepam (VALIUM) 5 MG tablet TAKE 1 TABLET BY MOUTH EVERY 12 HOURS AS NEEDED FOR ANXIETY 60 tablet 2   methylcellulose oral powder Take by mouth 2 (two) times daily. Uses PRN     metoprolol tartrate (LOPRESSOR) 50 MG tablet Take one tablet by mouth 2 hours prior to CT. 1 tablet 0   Multiple Vitamin (MULTIVITAMIN) capsule Take 1 capsule by mouth daily.     rosuvastatin (CRESTOR) 10 MG tablet Take 1 tablet (10 mg total) by mouth daily. 90 tablet 3   tadalafil (CIALIS) 20 MG tablet TAKE 1 TABLET BY MOUTH ONCE DAILY AS NEEDED FOR ERECTILE DYSFUNCTION 30 tablet 0   vitamin B-12 (CYANOCOBALAMIN) 500 MCG tablet Take 500 mcg by mouth daily.     vitamin C (ASCORBIC ACID) 500 MG tablet Take 1,000 mg by mouth daily.     VITAMIN D PO Take by mouth daily.     zolpidem (AMBIEN) 10 MG tablet TAKE 1 TABLET BY MOUTH EVERY DAY AT BEDTIME AS NEEDED 90 tablet 1   No current facility-administered medications on file prior to visit.        ROS:  All others reviewed and negative.  Objective  PE:  BP 122/78 (BP Location: Left Arm, Patient Position: Sitting, Cuff Size: Normal)   Pulse 75   Temp 97.9 F (36.6 C) (Oral)   Ht 6\' 3"  (1.905 m)   Wt 244 lb (110.7 kg)   SpO2 98%   BMI 30.50 kg/m                 Constitutional: Pt appears in NAD               HENT: Head: NCAT.                Right Ear: External ear normal.                 Left Ear: External ear normal.                Eyes: . Pupils are equal, round, and reactive to light. Conjunctivae and EOM are normal               Nose: without d/c or deformity               Neck: Neck supple. Gross normal ROM               Cardiovascular: Normal rate and regular rhythm.                 Pulmonary/Chest: Effort normal and breath sounds without rales or wheezing.                Abd:  Soft, NT, ND, + BS, no organomegaly               Neurological: Pt is alert. At baseline orientation, motor grossly intact                Skin: Skin is warm. No rashes, no other new lesions, LE edema - none               Psychiatric: Pt behavior is normal without agitation   Micro: none  Cardiac tracings I have personally interpreted today:  none  Pertinent Radiological findings (summarize): none   Lab Results  Component Value Date   WBC 5.7 08/28/2022   HGB 15.1 08/28/2022   HCT 45.9 08/28/2022   PLT 259.0 08/28/2022   GLUCOSE 87 11/06/2022   CHOL 155 08/28/2022   TRIG 127.0 08/28/2022   HDL 44.80 08/28/2022   LDLDIRECT 131.0 03/18/2019   LDLCALC 85 08/28/2022   ALT 23 08/28/2022   AST 21 08/28/2022   NA 136 11/06/2022   K 4.4 11/06/2022   CL 103 11/06/2022   CREATININE 0.94 11/06/2022   BUN 15 11/06/2022   CO2 29 11/06/2022   TSH 4.26 08/28/2022   PSA 1.65 08/28/2022   HGBA1C 5.4 08/28/2022   MICROALBUR 1.3 08/03/2015   Assessment/Plan:  Brad Ray is a 62 y.o. White or Caucasian [1] male with  has a past medical history of Abdominal pain, right upper quadrant (12/09/2007), ALLERGIC RHINITIS (03/19/2007), Allergy, Coronary artery calcification seen on CT scan (04/19/2019), DEGENERATIVE JOINT DISEASE, LUMBAR SPINE (12/23/2006), DISC DISEASE, CERVICAL (12/23/2006), FATIGUE (04/28/2009), GERD (12/23/2006), HYPERCHOLESTEROLEMIA (12/23/2006), HYPERLIPIDEMIA (03/19/2007), HYPERSOMNIA (04/28/2009), HYPERTENSION (03/19/2007), HYPOTENSION (04/13/2009), INSOMNIA, HX OF (12/23/2006), LIVER MASS (12/11/2007), MENIERE'S DISEASE (12/23/2006), OTITIS MEDIA, LEFT (04/13/2009), PERFORATION, TYMPANIC MEMBRANE NOS (12/23/2006), and Sleep apnea.  Spell of generalized weakness Etiology unclear, for bmp now, also cortisol in am, refer endo r/o underlying such as adrenal d/o  Dizzy Exam o/w benign, recent labs reviewed, cont to follow  Hypothyroidism Lab Results  Component Value Date   TSH 4.26 08/28/2022   Stable, pt to continue levothyroxine armour thyroid 90  Followup: Return if symptoms worsen or fail to  improve.  Oliver Barre, MD 11/08/2022 11:35 AM [facility]

## 2022-11-06 NOTE — Patient Instructions (Signed)
Please continue all other medications as before, and refills have been done if requested.  Please have the pharmacy call with any other refills you may need.  Please keep your appointments with your specialists as you may have planned  Please go to the LAB at the blood drawing area for the tests to be done - the BMP testing now  Please go to the LAB at the blood drawing area for the tests to be done - tomorrow for the Cortisol level  Please schedule the bone density test before leaving today at the scheduling desk (where you check out)

## 2022-11-06 NOTE — Progress Notes (Signed)
The test results show that your current treatment is OK, as the tests are stable.  Please continue the same plan.  There is no other need for change of treatment or further evaluation based on these results, at this time.  thanks 

## 2022-11-07 LAB — CORTISOL: Cortisol, Plasma: 14.6 ug/dL

## 2022-11-08 ENCOUNTER — Encounter: Payer: Self-pay | Admitting: Internal Medicine

## 2022-11-08 DIAGNOSIS — R531 Weakness: Secondary | ICD-10-CM | POA: Insufficient documentation

## 2022-11-08 NOTE — Assessment & Plan Note (Signed)
Exam o/w benign, recent labs reviewed, cont to follow

## 2022-11-08 NOTE — Assessment & Plan Note (Addendum)
Etiology unclear, for bmp now, also cortisol in am, refer endo r/o underlying such as adrenal d/o

## 2022-11-08 NOTE — Assessment & Plan Note (Signed)
Lab Results  Component Value Date   TSH 4.26 08/28/2022   Stable, pt to continue levothyroxine armour thyroid 90

## 2022-11-11 ENCOUNTER — Other Ambulatory Visit: Payer: Self-pay | Admitting: Internal Medicine

## 2022-11-20 DIAGNOSIS — H43812 Vitreous degeneration, left eye: Secondary | ICD-10-CM | POA: Diagnosis not present

## 2022-11-20 DIAGNOSIS — H31002 Unspecified chorioretinal scars, left eye: Secondary | ICD-10-CM | POA: Diagnosis not present

## 2022-11-21 NOTE — Progress Notes (Deleted)
CARDIOLOGY CONSULT NOTE       Patient ID: Brad Ray MRN: [id number] DOB/AGE: 10/23/1960 62 y.o.  Referring Physician: Jonny Ruiz Primary Physician: Corwin Levins, MD    HPI: 62 y.o. first seen at request of Dr Jonny Ruiz 11/10/19 for tachycardia. History of OSA and bad COVID infection August 2021 Received monoclonal antibody and f/U CT fortunately 01/11/20 showed good resolution of multi lobar pneumonia. 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 Cardiac CTA 04/16/19 with calcium score 70 which was 72 nd percentile for age and sex. He had been non compliant with statin and restarted He was on armor thyroid and T4 normal with suppressed TSH    Seen in ED 10/2021 for left sided Otalgia given cipro drops  History of colon polyps sees [facility] Had small sessile cecal polyp  Removed 03/2021   10/23/20 LDL 99  TSH 0.291   Wife with alpha gal and some allergy issues Son Loyal Jacobson is still doing fitness work Daughter having 3 rd child     Calcium Score 09/20/22 reviewed  217 , 80 th percentile ETT with no ischemic changes but had drop in blood pressure during exercise   Seen by primary 09/02/22 2 months lightheadedness, dizziness and dyspnea Has had recent hunting trip with multiple tick bites BP 110/68 and pulse 90 during office visit Micardis dose reduced to 40 mg 1/2 tab daily TSH 4.26 Pending tick serology, carotids echo and labs   He complains of fatigue and dyspnea especially walking up hill [place] and the hill is difficult for him He does not run much due to knee issues No issues lifting weights Denies Cialis or other drugs prior to stress test. He is not sure reading was accurate during stress test as he had no symptoms. No chest pain Micardis dose decreased by primary for some postural symptoms TSH cortisol and tick serology pending   Has chronic inner ear issues especially at night when he loses his  visual que's in the dark. Cannot hear hardly at all in left ear   Micardis changed to Norvasc to see if dizziness less.    Cardiac CTA done 10/04/22 reviewed: Calcium Score 209 79 th percentile CAD RADS 2 with 25-49% in proximal LCX.  Mild ascending aorta dilatation 3.8 cm   ***  ROS All other systems reviewed and negative except as noted above  Past Medical History:  Diagnosis Date  . Abdominal pain, right upper quadrant 12/09/2007  . ALLERGIC RHINITIS 03/19/2007  . Allergy   . Coronary artery calcification seen on CT scan 04/19/2019  . DEGENERATIVE JOINT DISEASE, LUMBAR SPINE 12/23/2006  . DISC DISEASE, CERVICAL 12/23/2006  . FATIGUE 04/28/2009  . GERD 12/23/2006  . HYPERCHOLESTEROLEMIA 12/23/2006  . HYPERLIPIDEMIA 03/19/2007  . HYPERSOMNIA 04/28/2009  . HYPERTENSION 03/19/2007  . HYPOTENSION 04/13/2009  . INSOMNIA, HX OF 12/23/2006  . LIVER MASS 12/11/2007  . MENIERE'S DISEASE 12/23/2006  . OTITIS MEDIA, LEFT 04/13/2009  . PERFORATION, TYMPANIC MEMBRANE NOS 12/23/2006  . Sleep apnea    uses CPAP    Family History  Problem Relation Age of Onset  . Diabetes Father   . Hypertension Father   . Heart disease Father   . Alcohol abuse Other   . Colon cancer Neg Hx   . Pancreatic cancer Neg Hx   . Stomach cancer Neg Hx   . Esophageal cancer Neg Hx   .  Rectal cancer Neg Hx     Social History   Socioeconomic History  . Marital status: Married    Spouse name: Not on file  . Number of children: 1  . Years of education: Not on file  . Highest education level: Not on file  Occupational History  . Not on file  Tobacco Use  . Smoking status: Never  . Smokeless tobacco: Never  Vaping Use  . Vaping status: Never Used  Substance and Sexual Activity  . Alcohol use: Yes    Comment: weekly beer  . Drug use: No  . Sexual activity: Not on file  Other Topics Concern  . Not on file  Social History Narrative   1 child at [place]   Social Determinants of Health    Financial Resource Strain: Not on file  Food Insecurity: Not on file  Transportation Needs: Not on file  Physical Activity: Not on file  Stress: Not on file  Social Connections: Not on file  Intimate Partner Violence: Not on file    Past Surgical History:  Procedure Laterality Date  . COLONOSCOPY    . left knee (MCL) repair    . s/p left ear surgury for vertigo    . s/p left knee medial collateral ligament damage        Current Outpatient Medications:  .  amLODipine (NORVASC) 5 MG tablet, Take 1 tablet (5 mg total) by mouth daily., Disp: 90 tablet, Rfl: 3 .  ARMOUR THYROID 90 MG tablet, Take 1 tablet (90 mg total) by mouth daily., Disp: 90 tablet, Rfl: 3 .  diazepam (VALIUM) 5 MG tablet, TAKE 1 TABLET BY MOUTH EVERY 12 HOURS AS NEEDED FOR ANXIETY, Disp: 60 tablet, Rfl: 2 .  methylcellulose oral powder, Take by mouth 2 (two) times daily. Uses PRN, Disp: , Rfl:  .  metoprolol tartrate (LOPRESSOR) 50 MG tablet, Take one tablet by mouth 2 hours prior to CT., Disp: 1 tablet, Rfl: 0 .  Multiple Vitamin (MULTIVITAMIN) capsule, Take 1 capsule by mouth daily., Disp: , Rfl:  .  rosuvastatin (CRESTOR) 10 MG tablet, Take 1 tablet (10 mg total) by mouth daily., Disp: 90 tablet, Rfl: 3 .  tadalafil (CIALIS) 20 MG tablet, TAKE 1 TABLET BY MOUTH ONCE DAILY AS NEEDED FOR ERECTILE DYSFUNCTION, Disp: 30 tablet, Rfl: 0 .  vitamin B-12 (CYANOCOBALAMIN) 500 MCG tablet, Take 500 mcg by mouth daily., Disp: , Rfl:  .  vitamin C (ASCORBIC ACID) 500 MG tablet, Take 1,000 mg by mouth daily., Disp: , Rfl:  .  VITAMIN D PO, Take by mouth daily., Disp: , Rfl:  .  zolpidem (AMBIEN) 10 MG tablet, TAKE 1 TABLET BY MOUTH EVERY DAY AT BEDTIME AS NEEDED, Disp: 90 tablet, Rfl: 1     Physical Exam: There were no vitals taken for this visit.    Affect appropriate Healthy:  appears stated age HEENT: normal Neck supple with no adenopathy JVP normal no bruits no thyromegaly Lungs clear with no wheezing and good  diaphragmatic motion Heart:  S1/S2 no murmur, no rub, gallop or click PMI normal Abdomen: benighn, BS positve, no tenderness, no AAA no bruit.  No HSM or HJR Distal pulses intact with no bruits No edema Neuro non-focal Skin warm and dry No muscular weakness   Labs:   Lab Results  Component Value Date   WBC 5.7 08/28/2022   HGB 15.1 08/28/2022   HCT 45.9 08/28/2022   MCV 90.3 08/28/2022   PLT 259.0 08/28/2022  No results for input(s): "NA", "K", "CL", "CO2", "BUN", "CREATININE", "CALCIUM", "PROT", "BILITOT", "ALKPHOS", "ALT", "AST", "GLUCOSE" in the last 168 hours.  Invalid input(s): "LABALBU" No results found for: "CKTOTAL", "CKMB", "CKMBINDEX", "TROPONINI"  Lab Results  Component Value Date   CHOL 155 08/28/2022   CHOL 167 10/24/2021   CHOL 176 10/23/2020   Lab Results  Component Value Date   HDL 44.80 08/28/2022   HDL 51 10/24/2021   HDL 53.00 10/23/2020   Lab Results  Component Value Date   LDLCALC 85 08/28/2022   LDLCALC 93 10/24/2021   LDLCALC 99 10/23/2020   Lab Results  Component Value Date   TRIG 127.0 08/28/2022   TRIG 128 10/24/2021   TRIG 120.0 10/23/2020   Lab Results  Component Value Date   CHOLHDL 3 08/28/2022   CHOLHDL 3.3 10/24/2021   CHOLHDL 3 10/23/2020   Lab Results  Component Value Date   LDLDIRECT 131.0 03/18/2019   LDLDIRECT 133.0 07/15/2017   LDLDIRECT 173.7 03/17/2013      Radiology: No results found.  EKG: 03/25/19 SR normal ECG 11/21/2022 SR rate 67 normal    ASSESSMENT AND PLAN:   1. COVID:  Improved CT 01/11/20 with resolution of interstitial changes  Sed Rate and C reactive protein normal November 10 2019   2. Tachycardia:  Improved ? Related to anxiety and COVID ECG normal 48 hr monitor never done will defer at this point   3. CAD: no chest pain high calcium score for age 42 which is 76 th percentile for age and sex ASA/Statin CTA 10/04/22 no obstructive dx see description above   4. HTN: Micardis d/c now on  norvasc 5 mg    5. HLD: on crestor  LDL 85 08/28/22 Only taking 5 mg Crestor since 05/03/22     6. Thyroid:  On Armour Thyroid see above dosing per primary TSH normal  7. DM:  Discussed low carb diet.  Target hemoglobin A1c is 6.5 or less.  Continue current medications.   8. Dizziness/Dyspnea:  EF normal see above r/o CAD change ARB to norvasc 5 mg and see if this helps   9. Aorta:  mild dilatation 3.8 cm no need for f/u after CTA done 10/04/22 BP control as above   10. Weakness seen by primary during "spell" and BP was fine TSH 4.2  Random cortisol normal 14.6 BS low a month ago 59 has been referred to endocrine by primary   F/U in a year    Signed: Charlton Haws 11/21/2022, 2:15 PM

## 2022-11-29 ENCOUNTER — Telehealth: Payer: Self-pay

## 2022-11-29 ENCOUNTER — Ambulatory Visit: Payer: BC Managed Care – PPO | Admitting: Cardiovascular Disease

## 2022-11-29 NOTE — Telephone Encounter (Signed)
Called patient to make sure he is doing okay, cancel appointment for today, and make sure he got his CT results. Per Dr. Eden Emms CT did show Non-obstructive CAD. Patient has follow-up in January.

## 2022-12-05 NOTE — Telephone Encounter (Signed)
Called patient back. Informed him that Dr. Eden Emms was concerned. Patient stated he was out of town and had to reschedule. Patient is aware of his results. The first available appointment was in January, so that is why he is following up then. Will send message to Dr. Eden Emms so he is aware.

## 2022-12-05 NOTE — Telephone Encounter (Signed)
Pt returning nurses phone call. Please advise ?

## 2022-12-06 ENCOUNTER — Other Ambulatory Visit: Payer: Self-pay | Admitting: Internal Medicine

## 2023-01-24 DIAGNOSIS — G4733 Obstructive sleep apnea (adult) (pediatric): Secondary | ICD-10-CM | POA: Diagnosis not present

## 2023-02-23 DIAGNOSIS — G4733 Obstructive sleep apnea (adult) (pediatric): Secondary | ICD-10-CM | POA: Diagnosis not present

## 2023-02-25 NOTE — Progress Notes (Unsigned)
   Acute Office Visit  Subjective:     Patient ID: Brad Ray, male    DOB: 1960/04/17, 62 y.o.   MRN: [id number]  No chief complaint on file.   HPI Patient is in today for ***  ROS Per HPI      Objective:    There were no vitals taken for this visit.   Physical Exam Vitals and nursing note reviewed.  Constitutional:      Appearance: Normal appearance.  HENT:     Head: Normocephalic and atraumatic.  Eyes:     Extraocular Movements: Extraocular movements intact.  Cardiovascular:     Rate and Rhythm: Normal rate and regular rhythm.     Pulses: Normal pulses.     Heart sounds: Normal heart sounds.  Pulmonary:     Effort: Pulmonary effort is normal.     Breath sounds: Normal breath sounds.  Musculoskeletal:        General: Normal range of motion.     Cervical back: Normal range of motion.  Skin:    General: Skin is warm and dry.  Neurological:     General: No focal deficit present.     Mental Status: He is alert and oriented to person, place, and time.  Psychiatric:        Mood and Affect: Mood normal.        Behavior: Behavior normal.   No results found for any visits on 02/26/23.      Assessment & Plan:  ***  No orders of the defined types were placed in this encounter.   No follow-ups on file.  Moshe Cipro, FNP

## 2023-02-26 ENCOUNTER — Ambulatory Visit (INDEPENDENT_AMBULATORY_CARE_PROVIDER_SITE_OTHER): Payer: BC Managed Care – PPO | Admitting: Family Medicine

## 2023-02-26 ENCOUNTER — Encounter: Payer: Self-pay | Admitting: Family Medicine

## 2023-02-26 VITALS — BP 128/82 | HR 68 | Temp 97.7°F | Ht 75.0 in | Wt 246.6 lb

## 2023-02-26 DIAGNOSIS — G540 Brachial plexus disorders: Secondary | ICD-10-CM

## 2023-02-26 DIAGNOSIS — K2100 Gastro-esophageal reflux disease with esophagitis, without bleeding: Secondary | ICD-10-CM | POA: Diagnosis not present

## 2023-02-26 DIAGNOSIS — M25512 Pain in left shoulder: Secondary | ICD-10-CM | POA: Diagnosis not present

## 2023-02-26 NOTE — Patient Instructions (Addendum)
[email or web address]  Donnetta Hail on Youtube for Thoracic Outlet Syndrome  Anti inflammatories as needed  Follow-up with me for new or worsening symptoms.  We will get a referral to sports medicine with no improvement over the next 2 weeks.

## 2023-03-18 NOTE — Progress Notes (Signed)
 CARDIOLOGY CONSULT NOTE       Patient ID: Brad Ray MRN: [id number] DOB/AGE: 1961/03/03 63 y.o.  Referring Physician: Autry Legions Primary Physician: Roslyn Coombe, MD    HPI: 63 y.o. first seen at request of Dr Autry Legions 11/10/19 for tachycardia. History of OSA and bad COVID infection August 2021 Received monoclonal antibody and f/U CT fortunately 01/11/20 showed good resolution of multi lobar pneumonia. 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 Cardiac CTA 04/16/19 with calcium  score 70 which was 72 nd percentile for age and sex. He had been non compliant with statin and restarted He was on armor thyroid  and T4 normal with suppressed TSH    Seen in ED 10/2021 for left sided Otalgia given cipro  drops  History of colon polyps sees Dannis Had small sessile cecal polyp  Removed 03/2021   10/23/20 LDL 99  TSH 0.291   Wife with alpha gal and some allergy issues Son Sherry Dome is still doing fitness work Daughter having 3 rd child     Calcium  Score 09/20/22 reviewed  217 , 80 th percentile ETT with no ischemic changes but had drop in blood pressure during exercise   Seen by primary 09/02/22 2 months lightheadedness, dizziness and dyspnea Has had recent hunting trip with multiple tick bites BP 110/68 and pulse 90 during office visit Micardis  dose reduced to 40 mg 1/2 tab daily TSH 4.26 Pending tick serology, carotids echo and labs   He complains of fatigue and dyspnea especially walking up hill [place] and the [place] is difficult for him He does not run much due to knee issues No issues lifting weights Denies Cialis  or other drugs prior to stress test. He is not sure reading was accurate during stress test as he had no symptoms. No chest pain Micardis  dose decreased by primary for some postural symptoms TSH cortisol and tick serology negative Cortisol level normal TSH 4.26  09/26/22 Shared decision making  discussed 44mm Aorta on TTE and hypotension with stress in absence of symptoms or ST changes Favor gated cardiac CTA scan from clavicles down to r/o obstructive CAD/LM and size aorta  Also discussed changing his BP med to norvasc  5 mg as the mycardis may contribute to postural symptoms  he has chronic inner ear issues especially at night when he loses his visual que's in the dark. Cannot hear hardly at all in left ear   Cardiac CT 10/04/22 calcium  score 209 , 79 th percentile TPV 297 mm3 No obstrucitve dx Aorta only 3.8 cm   Having some reflux and left muscular shoulder pain when seen by primary 02/26/23   ROS All other systems reviewed and negative except as noted above  Past Medical History:  Diagnosis Date   Abdominal pain, right upper quadrant 12/09/2007   ALLERGIC RHINITIS 03/19/2007   Allergy    Coronary artery calcification seen on CT scan 04/19/2019   DEGENERATIVE JOINT DISEASE, LUMBAR SPINE 12/23/2006   DISC DISEASE, CERVICAL 12/23/2006   FATIGUE 04/28/2009   GERD 12/23/2006   HYPERCHOLESTEROLEMIA 12/23/2006   HYPERLIPIDEMIA 03/19/2007   HYPERSOMNIA 04/28/2009   HYPERTENSION 03/19/2007   HYPOTENSION 04/13/2009   INSOMNIA, HX OF 12/23/2006   LIVER MASS 12/11/2007   MENIERE'S DISEASE 12/23/2006   OTITIS MEDIA, LEFT 04/13/2009   PERFORATION, TYMPANIC MEMBRANE NOS 12/23/2006   Sleep apnea    uses CPAP    Family History  Problem Relation Age of Onset   Diabetes Father    Hypertension Father    Heart disease Father    Alcohol abuse Other    Colon cancer Neg Hx    Pancreatic cancer Neg Hx    Stomach cancer Neg Hx    Esophageal cancer Neg Hx    Rectal cancer Neg Hx     Social History   Socioeconomic History   Marital status: Married    Spouse name: Not on file   Number of children: 1   Years of education: Not on file   Highest education level: Not on file  Occupational History   Not on file  Tobacco Use   Smoking status: Never   Smokeless tobacco: Never   Vaping Use   Vaping status: Never Used  Substance and Sexual Activity   Alcohol use: Yes    Comment: weekly beer   Drug use: No   Sexual activity: Not on file  Other Topics Concern   Not on file  Social History Narrative   1 child at [place]   Social Drivers of Corporate investment banker Strain: Not on file  Food Insecurity: Not on file  Transportation Needs: Not on file  Physical Activity: Not on file  Stress: Not on file  Social Connections: Not on file  Intimate Partner Violence: Not on file    Past Surgical History:  Procedure Laterality Date   COLONOSCOPY     left knee (MCL) repair     s/p left ear surgury for vertigo     s/p left knee medial collateral ligament damage        Current Outpatient Medications:    amLODipine  (NORVASC ) 5 MG tablet, Take 1 tablet (5 mg total) by mouth daily., Disp: 90 tablet, Rfl: 3   ARMOUR THYROID  90 MG tablet, TAKE 1 TABLET BY MOUTH EVERY DAY, Disp: 90 tablet, Rfl: 3   diazepam  (VALIUM ) 5 MG tablet, TAKE 1 TABLET BY MOUTH EVERY 12 HOURS AS NEEDED FOR ANXIETY, Disp: 60 tablet, Rfl: 2   methylcellulose oral powder, Take by mouth 2 (two) times daily. Uses PRN, Disp: , Rfl:    metoprolol  tartrate (LOPRESSOR ) 50 MG tablet, Take one tablet by mouth 2 hours prior to CT., Disp: 1 tablet, Rfl: 0   Multiple Vitamin (MULTIVITAMIN) capsule, Take 1 capsule by mouth daily., Disp: , Rfl:    rosuvastatin  (CRESTOR ) 10 MG tablet, Take 1 tablet (10 mg total) by mouth daily., Disp: 90 tablet, Rfl: 3   tadalafil  (CIALIS ) 20 MG tablet, TAKE 1 TABLET BY MOUTH ONCE DAILY AS NEEDED FOR ERECTILE DYSFUNCTION, Disp: 30 tablet, Rfl: 0   vitamin B-12 (CYANOCOBALAMIN ) 500 MCG tablet, Take 500 mcg by mouth daily., Disp: , Rfl:    vitamin C (ASCORBIC ACID) 500 MG tablet, Take 1,000 mg by mouth daily., Disp: , Rfl:    VITAMIN D  PO, Take by mouth daily., Disp: , Rfl:    zolpidem  (AMBIEN ) 10 MG tablet, TAKE 1 TABLET BY MOUTH EVERY DAY AT BEDTIME AS NEEDED, Disp: 90  tablet, Rfl: 1     Physical Exam: Blood pressure 130/80, pulse 75, height 6\' 3"  (1.905 m), weight 242 lb (109.8 kg), SpO2 96%.    Affect appropriate Healthy:  appears stated age HEENT: normal Neck supple with no adenopathy JVP normal no bruits no thyromegaly Lungs clear with no wheezing and good diaphragmatic motion Heart:  S1/S2 no murmur, no rub, gallop or click PMI normal Abdomen: benighn, BS positve, no  tenderness, no AAA no bruit.  No HSM or HJR Distal pulses intact with no bruits No edema Neuro non-focal Skin warm and dry No muscular weakness   Labs:   Lab Results  Component Value Date   WBC 5.7 08/28/2022   HGB 15.1 08/28/2022   HCT 45.9 08/28/2022   MCV 90.3 08/28/2022   PLT 259.0 08/28/2022   No results for input(s): "NA", "K", "CL", "CO2", "BUN", "CREATININE", "CALCIUM ", "PROT", "BILITOT", "ALKPHOS", "ALT", "AST", "GLUCOSE" in the last 168 hours.  Invalid input(s): "LABALBU" No results found for: "CKTOTAL", "CKMB", "CKMBINDEX", "TROPONINI"  Lab Results  Component Value Date   CHOL 155 08/28/2022   CHOL 167 10/24/2021   CHOL 176 10/23/2020   Lab Results  Component Value Date   HDL 44.80 08/28/2022   HDL 51 10/24/2021   HDL 53.00 10/23/2020   Lab Results  Component Value Date   LDLCALC 85 08/28/2022   LDLCALC 93 10/24/2021   LDLCALC 99 10/23/2020   Lab Results  Component Value Date   TRIG 127.0 08/28/2022   TRIG 128 10/24/2021   TRIG 120.0 10/23/2020   Lab Results  Component Value Date   CHOLHDL 3 08/28/2022   CHOLHDL 3.3 10/24/2021   CHOLHDL 3 10/23/2020   Lab Results  Component Value Date   LDLDIRECT 131.0 03/18/2019   LDLDIRECT 133.0 07/15/2017   LDLDIRECT 173.7 03/17/2013      Radiology: No results found.  EKG: 03/25/19 SR normal ECG 03/26/2023 SR rate 67 normal    ASSESSMENT AND PLAN:   1. COVID:  Improved CT 01/11/20 with resolution of interstitial changes  Sed Rate and C reactive protein normal November 10 2019   2.  Tachycardia:  Improved ? Related to anxiety and COVID ECG normal 48 hr monitor never done will defer at this point   3. CAD: Non obstructive on cardiac CTA  10/04/22 CADRADS 2 calcium  score elevated for age with increased TPV. ASA And statin see below  4. HTN: ? Micardis  contributing to postural symptoms labs with primary pending including TSH/cortisol tick serology Add norvasc  5 mg daily  5. HLD: on crestor   dose increased to 10 mg July 2024 prior to dose increase LDL 85  6. Thyroid :  On Armour Thyroid  see above dosing per primary TSH normal  7. DM:  Discussed low carb diet.  Target hemoglobin A1c is 6.5 or less.  Continue current medications.   8. Dizziness/Dyspnea:  EF normal see above r/o CAD change ARB to norvasc  5 mg and see if this helps   9.  Dilated Aorta: only 3.8 cm by cardiac CTA 10/04/22   Lipid/Liver   F/U  6 months   Signed: Janelle Mediate 03/26/2023, 9:40 AM

## 2023-03-24 ENCOUNTER — Other Ambulatory Visit: Payer: Self-pay | Admitting: Internal Medicine

## 2023-03-25 ENCOUNTER — Other Ambulatory Visit: Payer: Self-pay

## 2023-03-26 ENCOUNTER — Ambulatory Visit: Payer: BC Managed Care – PPO | Attending: Cardiovascular Disease | Admitting: Cardiovascular Disease

## 2023-03-26 VITALS — BP 130/80 | HR 75 | Ht 75.0 in | Wt 242.0 lb

## 2023-03-26 DIAGNOSIS — I2510 Atherosclerotic heart disease of native coronary artery without angina pectoris: Secondary | ICD-10-CM | POA: Diagnosis not present

## 2023-03-26 DIAGNOSIS — E785 Hyperlipidemia, unspecified: Secondary | ICD-10-CM

## 2023-03-26 DIAGNOSIS — G4733 Obstructive sleep apnea (adult) (pediatric): Secondary | ICD-10-CM | POA: Diagnosis not present

## 2023-03-26 NOTE — Patient Instructions (Addendum)
 Medication Instructions:  Your physician recommends that you continue on your current medications as directed. Please refer to the Current Medication list given to you today.  *If you need a refill on your cardiac medications before your next appointment, please call your pharmacy*  Lab Work: Your physician recommends that you have lab work at [facility] for fasting lipid and liver panel.  If you have labs (blood work) drawn today and your tests are completely normal, you will receive your results only by: MyChart Message (if you have MyChart) OR A paper copy in the mail If you have any lab test that is abnormal or we need to change your treatment, we will call you to review the results.  Testing/Procedures: None ordered today.  Follow-Up: At [facility], you and your health needs are our priority.  As part of our continuing mission to provide you with exceptional heart care, we have created designated Provider Care Teams.  These Care Teams include your primary Cardiologist (physician) and Advanced Practice Providers (APPs -  Physician Assistants and Nurse Practitioners) who all work together to provide you with the care you need, when you need it.  We recommend signing up for the patient portal called "MyChart".  Sign up information is provided on this After Visit Summary.  MyChart is used to connect with patients for Virtual Visits (Telemedicine).  Patients are able to view lab/test results, encounter notes, upcoming appointments, etc.  Non-urgent messages can be sent to your provider as well.   To learn more about what you can do with MyChart, go to [email or web address].    Your next appointment:   6 month(s)  Provider:   Janelle Mediate, MD     Other Instructions

## 2023-03-27 DIAGNOSIS — I2510 Atherosclerotic heart disease of native coronary artery without angina pectoris: Secondary | ICD-10-CM | POA: Diagnosis not present

## 2023-03-27 DIAGNOSIS — E785 Hyperlipidemia, unspecified: Secondary | ICD-10-CM | POA: Diagnosis not present

## 2023-03-27 LAB — HEPATIC FUNCTION PANEL
ALT: 33 [IU]/L (ref 0–44)
AST: 29 [IU]/L (ref 0–40)
Albumin: 4.2 g/dL (ref 3.8–4.9)
Alkaline Phosphatase: 73 [IU]/L (ref 44–121)
Bilirubin Total: 0.6 mg/dL (ref 0.0–1.2)
Bilirubin, Direct: 0.18 mg/dL (ref 0.00–0.40)
Total Protein: 7.1 g/dL (ref 6.0–8.5)

## 2023-03-27 LAB — LIPID PANEL
Chol/HDL Ratio: 3.3 {ratio} (ref 0.0–5.0)
Cholesterol, Total: 179 mg/dL (ref 100–199)
HDL: 54 mg/dL (ref >39)
LDL Chol Calc (NIH): 105 mg/dL — ABNORMAL HIGH (ref 0–99)
Triglycerides: 114 mg/dL (ref 0–149)
VLDL Cholesterol Cal: 20 mg/dL (ref 5–40)

## 2023-03-28 ENCOUNTER — Telehealth: Payer: Self-pay

## 2023-03-28 ENCOUNTER — Other Ambulatory Visit (HOSPITAL_COMMUNITY): Payer: Self-pay

## 2023-03-28 DIAGNOSIS — E782 Mixed hyperlipidemia: Secondary | ICD-10-CM

## 2023-03-28 DIAGNOSIS — I2510 Atherosclerotic heart disease of native coronary artery without angina pectoris: Secondary | ICD-10-CM

## 2023-03-28 MED ORDER — ROSUVASTATIN CALCIUM 20 MG PO TABS
20.0000 mg | ORAL_TABLET | Freq: Every day | ORAL | 3 refills | Status: DC
  Filled 2023-03-28: qty 90, 90d supply, fill #0

## 2023-03-28 NOTE — Telephone Encounter (Signed)
-----   Message from Charlton Haws sent at 03/28/2023  7:47 AM EST ----- LDL too high given high calcium score Needs to be < 70 increase crestor to 20 mg daily and repeat labs in 3 months

## 2023-03-31 ENCOUNTER — Telehealth: Payer: Self-pay

## 2023-03-31 DIAGNOSIS — E785 Hyperlipidemia, unspecified: Secondary | ICD-10-CM

## 2023-03-31 NOTE — Telephone Encounter (Signed)
Will place referral for pharmacy.

## 2023-03-31 NOTE — Telephone Encounter (Signed)
-----   Message from Charlton Haws sent at 03/28/2023  3:15 PM EST ----- That's fine ----- Message ----- From: Ethelda Chick, RN Sent: 03/28/2023   1:06 PM EST To: Wendall Stade, MD  Patient called back and stated he cannot handle taking higher dose of Crestor. Will see if we can refer him to lipid clinic. Will see if okay to place referral.

## 2023-04-09 ENCOUNTER — Other Ambulatory Visit (HOSPITAL_COMMUNITY): Payer: Self-pay

## 2023-05-21 ENCOUNTER — Ambulatory Visit: Payer: BC Managed Care – PPO | Attending: Cardiology | Admitting: Pharmacist

## 2023-05-21 ENCOUNTER — Encounter: Payer: Self-pay | Admitting: Pharmacist

## 2023-05-21 ENCOUNTER — Other Ambulatory Visit (HOSPITAL_COMMUNITY): Payer: Self-pay

## 2023-05-21 ENCOUNTER — Telehealth: Payer: Self-pay | Admitting: Pharmacy Technician

## 2023-05-21 ENCOUNTER — Telehealth: Payer: Self-pay | Admitting: Pharmacist

## 2023-05-21 DIAGNOSIS — E7849 Other hyperlipidemia: Secondary | ICD-10-CM | POA: Diagnosis not present

## 2023-05-21 MED ORDER — ROSUVASTATIN CALCIUM 5 MG PO TABS
5.0000 mg | ORAL_TABLET | Freq: Every day | ORAL | 3 refills | Status: DC
Start: 2023-05-21 — End: 2023-08-29

## 2023-05-21 MED ORDER — REPATHA SURECLICK 140 MG/ML ~~LOC~~ SOAJ
140.0000 mg | SUBCUTANEOUS | 3 refills | Status: DC
Start: 2023-05-21 — End: 2023-08-15

## 2023-05-21 NOTE — Assessment & Plan Note (Signed)
 Assessment:  LDL goal: <70 mg/dl last LDLc 962 mg/dl (95/28) while on Crestor 5 mg daily  Intolerance Crestor higher than 5 mg daily dose  Risk factors includes - CAD, CAC score 209 and total plaque volume 226mm3  Discussed next potential options ( ezetimibe, PCSK-9 inhibitors, bempedoic acid and inclisiran); cost, dosing efficacy, side effects  Follows heart healthy diet and exercise regularly   Plan: Continue taking Crestor 5 mg daily  Got PA approved for Repatha;start taking Repatha 140 mg SQ Q14d Lipid lab due in 2-3 months after starting [facility]

## 2023-05-21 NOTE — Telephone Encounter (Signed)
 Pharmacy Patient Advocate Encounter   Received notification from Pt Calls Messages that prior authorization for repatha is required/requested.   Insurance verification completed.   The patient is insured through [facility] .   Per test claim: PA required; PA submitted to above mentioned insurance via CoverMyMeds Key/confirmation #/EOC [id number] Status is pending

## 2023-05-21 NOTE — Patient Instructions (Addendum)
 Your Results:             Your most recent labs Goal  Total Cholesterol 179  < 200  Triglycerides 114 < 150  HDL (happy/good cholesterol) 54 > 40  LDL (lousy/bad cholesterol 105 < 70   Medication changes: start taking Crestor 5 mg daily  We will start the process to get PCSK9i (Praluent or Repatha) covered by your insurance.  Once the prior authorization is complete, we will call you to let you know and confirm pharmacy information.    Praluent is a cholesterol medication that improved your body's ability to get rid of "bad cholesterol" known as LDL. It can lower your LDL up to 60%. It is an injection that is given under the skin every 2 weeks. The most common side effects of Praluent include runny nose, symptoms of the common cold, rarely flu or flu-like symptoms, back/muscle pain in about 3-4% of the patients, and redness, pain, or bruising at the injection site.    Repatha is a cholesterol medication that improved your body's ability to get rid of "bad cholesterol" known as LDL. It can lower your LDL up to 60%! It is an injection that is given under the skin every 2 weeks. The medication often requires a prior authorization from your insurance company. The most common side effects of Repatha include runny nose, symptoms of the common cold, rarely flu or flu-like symptoms, back/muscle pain in about 3-4% of the patients, and redness, pain, or bruising at the injection site.   Lab orders: We want to repeat labs after 2-3 months.  We will send you a lab order to remind you once we get closer to that time.

## 2023-05-21 NOTE — Telephone Encounter (Signed)
 PA approved patient informed via phone - LVM see other encounter

## 2023-05-21 NOTE — Progress Notes (Signed)
 Patient ID: Brad Ray                 DOB: 29-Aug-1960                    MRN: [id number]      HPI: Brad Ray is a 63 y.o. male patient referred to lipid clinic by Dr.Nishan. PMH is significant for hypertension, CAD on Coronary CT scan,OSA, hyperlipidemia.   Patient was on Crestor 5 mg, LDL was elevated so dose increased to 10 mg  unable to tolerate 10 mg doe due to brain fog and muscle aches. 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   Reviewed options for lowering LDL cholesterol, including ezetimibe, PCSK-9 inhibitors, bempedoic acid and inclisiran.  Discussed mechanisms of action, dosing, side effects and potential decreases in LDL cholesterol.  Also reviewed cost information and potential options for patient assistance.  Current Medications: Crestor 5 mg daily  Intolerances: Crestor 20 mg nad 10 mg daily - brain fog, muscle and joint pain  Risk Factors: CAD, CAC score 209, total plaque volume 269mm3  LDL goal: <70 mg/dl Last lab: LDL 528, TG 413, TC 179, HDL 54   Diet: mostly healthy ( baked or grilled chicken for protein intake)  Like to snack on cookies after meal   Exercise: walk 2-5 miles per 5 days per week, home gym - resistance 3-4 times per week   Family History:  Relation Problem Comments  Mother Metallurgist)   Father (Deceased) Diabetes   Heart disease   Hypertension      Social History:  Alcohol: on weekend 4-5  Smoking: never  Labs:  Lipid Panel     Component Value Date/Time   CHOL 179 03/27/2023 0908   TRIG 114 03/27/2023 0908   HDL 54 03/27/2023 0908   CHOLHDL 3.3 03/27/2023 0908   CHOLHDL 3 08/28/2022 0818   VLDL 25.4 08/28/2022 0818   LDLCALC 105 (H) 03/27/2023 0908   LDLDIRECT 131.0 03/18/2019 1331   LABVLDL 20 03/27/2023 0908    Past Medical History:  Diagnosis Date   Abdominal pain, right upper quadrant 12/09/2007   ALLERGIC RHINITIS 03/19/2007   Allergy    Coronary artery calcification  seen on CT scan 04/19/2019   DEGENERATIVE JOINT DISEASE, LUMBAR SPINE 12/23/2006   DISC DISEASE, CERVICAL 12/23/2006   FATIGUE 04/28/2009   GERD 12/23/2006   HYPERCHOLESTEROLEMIA 12/23/2006   HYPERLIPIDEMIA 03/19/2007   HYPERSOMNIA 04/28/2009   HYPERTENSION 03/19/2007   HYPOTENSION 04/13/2009   INSOMNIA, HX OF 12/23/2006   LIVER MASS 12/11/2007   MENIERE'S DISEASE 12/23/2006   OTITIS MEDIA, LEFT 04/13/2009   PERFORATION, TYMPANIC MEMBRANE NOS 12/23/2006   Sleep apnea    uses CPAP    Current Outpatient Medications on File Prior to Visit  Medication Sig Dispense Refill   amLODipine (NORVASC) 5 MG tablet Take 1 tablet (5 mg total) by mouth daily. 90 tablet 3   ARMOUR THYROID 90 MG tablet TAKE 1 TABLET BY MOUTH EVERY DAY 90 tablet 3   diazepam (VALIUM) 5 MG tablet TAKE 1 TABLET BY MOUTH EVERY 12 HOURS AS NEEDED FOR ANXIETY 60 tablet 2   methylcellulose oral powder Take by mouth 2 (two) times daily. Uses PRN     metoprolol tartrate (LOPRESSOR) 50 MG tablet Take one tablet by mouth 2 hours prior to CT. 1 tablet 0   Multiple Vitamin (MULTIVITAMIN) capsule Take 1 capsule by mouth daily.  tadalafil (CIALIS) 20 MG tablet TAKE 1 TABLET BY MOUTH ONCE DAILY AS NEEDED FOR ERECTILE DYSFUNCTION 30 tablet 0   vitamin B-12 (CYANOCOBALAMIN) 500 MCG tablet Take 500 mcg by mouth daily.     vitamin C (ASCORBIC ACID) 500 MG tablet Take 1,000 mg by mouth daily.     VITAMIN D PO Take by mouth daily.     zolpidem (AMBIEN) 10 MG tablet TAKE 1 TABLET BY MOUTH EVERY DAY AT BEDTIME AS NEEDED 90 tablet 1   No current facility-administered medications on file prior to visit.    Allergies  Allergen Reactions   Penicillins     Childhood reaction    Assessment/Plan:  1. Hyperlipidemia -  Problem  Hyperlipidemia   Qualifier: Diagnosis of  By: Jonny Ruiz MD, Len Blalock   Formatting of this note might be different from the original. Formatting of this note might be different from the original. Qualifier:  Diagnosis of  By: Jonny Ruiz MD, Len Blalock  Last Assessment & Plan:  Formatting of this note might be different from the original. Declines statin for now, for CT cardiac scoring    Hyperlipidemia Assessment:  LDL goal: <70 mg/dl last LDLc 409 mg/dl (81/19) while on Crestor 5 mg daily  Intolerance Crestor higher than 5 mg daily dose  Risk factors includes - CAD, CAC score 209 and total plaque volume 270mm3  Discussed next potential options ( ezetimibe, PCSK-9 inhibitors, bempedoic acid and inclisiran); cost, dosing efficacy, side effects  Follows heart healthy diet and exercise regularly   Plan: Continue taking Crestor 5 mg daily  Got PA approved for Repatha;start taking Repatha 140 mg SQ Q14d Lipid lab due in 2-3 months after starting PCSK9i    Thank you,  Carmela Hurt, Pharm.D [facility] A Division of [facility] [address], [place], [place] [address]  Phone: [phone]; Fax: [phone]

## 2023-05-21 NOTE — Telephone Encounter (Signed)
 Pharmacy Patient Advocate Encounter  Received notification from [facility] that Prior Authorization for Repatha has been APPROVED from 05/21/23 to 05/20/24. Ran test claim, Copay is $24.99- one month. This test claim was processed through [facility]- copay amounts may vary at other pharmacies due to pharmacy/plan contracts, or as the patient moves through the different stages of their insurance plan.   PA #/Case ID/Reference #: [id number]

## 2023-06-02 ENCOUNTER — Telehealth: Payer: Self-pay | Admitting: Gastroenterology

## 2023-06-02 NOTE — Telephone Encounter (Signed)
 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  He stated he has always been seen every 3 years, however , in checking we have him coming back in 2028  5 years.    He would like an explanation in regards to why it is now 5 years.

## 2023-06-02 NOTE — Telephone Encounter (Signed)
 Left message for pt to call back

## 2023-06-03 NOTE — Telephone Encounter (Signed)
 Left message for pt to call back

## 2023-06-03 NOTE — Telephone Encounter (Signed)
 Pt questioned when his next recall was. Pt was noted that it was for 2028. Pt questioned why the change from every three years to every 5 years. Chart was reviewed and noted recent letter that was sent to pt by Dr Myrtie Neither  after last colonoscopy stating "Based on current nationally recognized surveillance guidelines, I recommend that you have a repeat colonoscopy in 5 years."  Pt made aware  Pt verbalized understanding with all questions answered.

## 2023-06-04 ENCOUNTER — Other Ambulatory Visit: Payer: Self-pay | Admitting: Internal Medicine

## 2023-06-29 ENCOUNTER — Other Ambulatory Visit: Payer: Self-pay | Admitting: Internal Medicine

## 2023-07-11 DIAGNOSIS — G4733 Obstructive sleep apnea (adult) (pediatric): Secondary | ICD-10-CM | POA: Diagnosis not present

## 2023-07-11 DIAGNOSIS — H9042 Sensorineural hearing loss, unilateral, left ear, with unrestricted hearing on the contralateral side: Secondary | ICD-10-CM | POA: Diagnosis not present

## 2023-07-15 ENCOUNTER — Other Ambulatory Visit: Payer: Self-pay | Admitting: Internal Medicine

## 2023-07-15 ENCOUNTER — Other Ambulatory Visit: Payer: Self-pay | Admitting: Cardiovascular Disease

## 2023-08-04 ENCOUNTER — Other Ambulatory Visit: Payer: Self-pay | Admitting: Cardiovascular Disease

## 2023-08-11 DIAGNOSIS — G4733 Obstructive sleep apnea (adult) (pediatric): Secondary | ICD-10-CM | POA: Diagnosis not present

## 2023-08-13 DIAGNOSIS — E7849 Other hyperlipidemia: Secondary | ICD-10-CM | POA: Diagnosis not present

## 2023-08-13 LAB — LIPID PANEL

## 2023-08-14 ENCOUNTER — Other Ambulatory Visit (HOSPITAL_COMMUNITY): Payer: Self-pay

## 2023-08-14 ENCOUNTER — Ambulatory Visit: Payer: Self-pay | Admitting: Cardiovascular Disease

## 2023-08-14 LAB — LIPID PANEL
Cholesterol, Total: 137 mg/dL (ref 100–199)
HDL: 51 mg/dL (ref >39)
LDL CALC COMMENT:: 2.7 ratio (ref 0.0–5.0)
LDL Chol Calc (NIH): 61 mg/dL (ref 0–99)
Triglycerides: 149 mg/dL (ref 0–149)
VLDL Cholesterol Cal: 25 mg/dL (ref 5–40)

## 2023-08-14 NOTE — Telephone Encounter (Signed)
 Coupon on file with [facility] $45 for 3 months

## 2023-08-15 ENCOUNTER — Other Ambulatory Visit (HOSPITAL_COMMUNITY): Payer: Self-pay

## 2023-08-15 ENCOUNTER — Telehealth: Payer: Self-pay | Admitting: Cardiovascular Disease

## 2023-08-15 MED ORDER — REPATHA SURECLICK 140 MG/ML ~~LOC~~ SOAJ
140.0000 mg | SUBCUTANEOUS | 3 refills | Status: AC
  Filled 2023-08-15 – 2023-08-27 (×2): qty 6, 84d supply, fill #0
  Filled 2023-12-01: qty 6, 84d supply, fill #1
  Filled 2024-02-23 – 2024-03-10 (×2): qty 6, 84d supply, fill #2

## 2023-08-15 NOTE — Telephone Encounter (Signed)
 Will send this information to Dr. Francie Irani RN as an Arlie Lain.

## 2023-08-15 NOTE — Telephone Encounter (Signed)
 Spoke to patient. Patient is in agreement [facility]. Pharmacy is aware to use coupon.  The cost for 3 moths $45.

## 2023-08-15 NOTE — Telephone Encounter (Signed)
 Please see separate encounter. [facility] was provided with a copay card.

## 2023-08-15 NOTE — Telephone Encounter (Signed)
 The matter has been addressed and the concern resolved. Please see the other encounter for details.

## 2023-08-15 NOTE — Telephone Encounter (Signed)
 Pt c/o medication issue:  1. Name of Medication:   Evolocumab  (REPATHA  SURECLICK) 140 MG/ML SOAJ   2. How are you currently taking this medication (dosage and times per day)?   As prescribed (1 shot every 2 weeks)  3. Are you having a reaction (difficulty breathing--STAT)?   4. What is your medication issue?   Patient stated he has not been taking rosuvastatin  (CRESTOR ) 5 MG tablet.  Patient stated he wants to get assistance getting the Repatha  through his insurance.

## 2023-08-15 NOTE — Telephone Encounter (Signed)
 Will forward this call to our PharmD team and pt assistance team, for further management.

## 2023-08-20 NOTE — Progress Notes (Signed)
 CARDIOLOGY CONSULT NOTE       Patient ID: Brad Ray MRN: [id number] DOB/AGE: 1961/03/06 63 y.o.  Referring Physician: Autry Legions Primary Physician: Roslyn Coombe, MD    HPI: 63 y.o. first seen at request of Dr Autry Legions 11/10/19 for tachycardia. History of OSA and bad COVID infection August 2021 Received monoclonal antibody and f/U CT fortunately 01/11/20 showed good resolution of multi lobar pneumonia. 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 Cardiac CTA 04/16/19 with calcium  score 70 which was 72 nd percentile for age and sex. He had been non compliant with statin and restarted He was on armor thyroid  and T4 normal with suppressed TSH    Seen in ED 10/2021 for left sided Otalgia given cipro  drops  History of colon polyps sees Dannis Had small sessile cecal polyp  Removed 03/2021   10/23/20 LDL 99  TSH 0.291   Wife with alpha gal and some allergy issues Son Sherry Dome is still doing fitness work Daughter having 3 rd child  Dino played 8 years in the [employer] with [employer]. He is in the [employer] of [place] and up for election to the [employer] of [employer] [employer] this year   Calcium  Score 09/20/22 reviewed  217 , 80 th percentile ETT with no ischemic changes but had drop in blood pressure during exercise   Seen by primary 09/02/22 2 months lightheadedness, dizziness and dyspnea Has had recent hunting trip with multiple tick bites BP 110/68 and pulse 90 during office visit Micardis  dose reduced to 40 mg 1/2 tab daily TSH 4.26 Pending tick serology, carotids echo and labs   He complains of fatigue and dyspnea especially walking up hill [place] is difficult for him He does not run much due to knee issues No issues lifting weights Denies Cialis  or other drugs prior to stress test. He is not sure reading was accurate during stress test as he had no symptoms. No chest pain Micardis  dose decreased  by primary for some postural symptoms TSH cortisol and tick serology negative Cortisol level normal TSH 4.26  09/26/22 Shared decision making discussed 44mm Aorta on TTE and hypotension with stress in absence of symptoms or ST changes Favor gated cardiac CTA scan from clavicles down to r/o obstructive CAD/LM and size aorta  Also discussed changing his BP med to norvasc  5 mg as the mycardis may contribute to postural symptoms  he has chronic inner ear issues especially at night when he loses his visual que's in the dark. Cannot hear hardly at all in left ear   Cardiac CT 10/04/22 calcium  score 209 , 79 th percentile TPV 297 mm3 No obstrucitve dx Aorta only 3.8 cm   Having some reflux and left muscular shoulder pain when seen by primary 02/26/23   ROS All other systems reviewed and negative except as noted above  Past Medical History:  Diagnosis Date   Abdominal pain, right upper quadrant 12/09/2007   ALLERGIC RHINITIS 03/19/2007   Allergy    Coronary artery calcification seen on CT scan 04/19/2019   DEGENERATIVE JOINT DISEASE, LUMBAR SPINE 12/23/2006   DISC DISEASE, CERVICAL 12/23/2006   FATIGUE 04/28/2009   GERD 12/23/2006   HYPERCHOLESTEROLEMIA 12/23/2006   HYPERLIPIDEMIA 03/19/2007   HYPERSOMNIA 04/28/2009   HYPERTENSION 03/19/2007   HYPOTENSION 04/13/2009   INSOMNIA, HX OF 12/23/2006   LIVER MASS 12/11/2007  MENIERE'S DISEASE 12/23/2006   OTITIS MEDIA, LEFT 04/13/2009   PERFORATION, TYMPANIC MEMBRANE NOS 12/23/2006   Sleep apnea    uses CPAP    Family History  Problem Relation Age of Onset   Diabetes Father    Hypertension Father    Heart disease Father    Alcohol abuse Other    Colon cancer Neg Hx    Pancreatic cancer Neg Hx    Stomach cancer Neg Hx    Esophageal cancer Neg Hx    Rectal cancer Neg Hx     Social History   Socioeconomic History   Marital status: Married    Spouse name: Not on file   Number of children: 1   Years of education: Not on file    Highest education level: Not on file  Occupational History   Not on file  Tobacco Use   Smoking status: Never   Smokeless tobacco: Never  Vaping Use   Vaping status: Never Used  Substance and Sexual Activity   Alcohol use: Yes    Comment: weekly beer   Drug use: No   Sexual activity: Not on file  Other Topics Concern   Not on file  Social History Narrative   1 child at [place]   Social Drivers of Corporate investment banker Strain: Not on file  Food Insecurity: Not on file  Transportation Needs: Not on file  Physical Activity: Not on file  Stress: Not on file  Social Connections: Not on file  Intimate Partner Violence: Not on file    Past Surgical History:  Procedure Laterality Date   COLONOSCOPY     left knee (MCL) repair     s/p left ear surgury for vertigo     s/p left knee medial collateral ligament damage        Current Outpatient Medications:    amLODipine  (NORVASC ) 5 MG tablet, Take 1 tablet (5 mg total) by mouth daily., Disp: 90 tablet, Rfl: 3   ARMOUR THYROID  90 MG tablet, TAKE 1 TABLET BY MOUTH EVERY DAY, Disp: 90 tablet, Rfl: 3   diazepam  (VALIUM ) 5 MG tablet, TAKE 1 TABLET BY MOUTH EVERY 12 HOURS AS NEEDED FOR ANXIETY, Disp: 60 tablet, Rfl: 2   Evolocumab  (REPATHA  SURECLICK) 140 MG/ML SOAJ, Inject 140 mg into the skin every 14 (fourteen) days., Disp: 6 mL, Rfl: 3   methylcellulose oral powder, Take by mouth 2 (two) times daily. Uses PRN, Disp: , Rfl:    metoprolol  tartrate (LOPRESSOR ) 50 MG tablet, Take one tablet by mouth 2 hours prior to CT., Disp: 1 tablet, Rfl: 0   Multiple Vitamin (MULTIVITAMIN) capsule, Take 1 capsule by mouth daily., Disp: , Rfl:    rosuvastatin  (CRESTOR ) 10 MG tablet, TAKE 1 TABLET BY MOUTH EVERY DAY, Disp: 90 tablet, Rfl: 3   tadalafil  (CIALIS ) 20 MG tablet, TAKE 1 TABLET BY MOUTH ONCE DAILY AS NEEDED FOR ERECTILE DYSFUNCTION, Disp: 90 tablet, Rfl: 3   vitamin B-12 (CYANOCOBALAMIN ) 500 MCG tablet, Take 500 mcg by mouth daily.,  Disp: , Rfl:    vitamin C (ASCORBIC ACID) 500 MG tablet, Take 1,000 mg by mouth daily., Disp: , Rfl:    VITAMIN D  PO, Take by mouth daily., Disp: , Rfl:    zolpidem  (AMBIEN ) 10 MG tablet, TAKE 1 TABLET BY MOUTH EVERY DAY AT BEDTIME AS NEEDED, Disp: 90 tablet, Rfl: 1     Physical Exam: Blood pressure 127/80, pulse 76, height 6' 3 (1.905 m), weight 249 lb (112.9  kg), SpO2 93%.    Affect appropriate Healthy:  appears stated age HEENT: normal Neck supple with no adenopathy JVP normal no bruits no thyromegaly Lungs clear with no wheezing and good diaphragmatic motion Heart:  S1/S2 no murmur, no rub, gallop or click PMI normal Abdomen: benighn, BS positve, no tenderness, no AAA no bruit.  No HSM or HJR Distal pulses intact with no bruits No edema Neuro non-focal Skin warm and dry No muscular weakness   Labs:   Lab Results  Component Value Date   WBC 5.7 08/28/2022   HGB 15.1 08/28/2022   HCT 45.9 08/28/2022   MCV 90.3 08/28/2022   PLT 259.0 08/28/2022   No results for input(s): NA, K, CL, CO2, BUN, CREATININE, CALCIUM , PROT, BILITOT, ALKPHOS, ALT, AST, GLUCOSE in the last 168 hours.  Invalid input(s): LABALBU No results found for: CKTOTAL, CKMB, CKMBINDEX, TROPONINI  Lab Results  Component Value Date   CHOL 137 08/13/2023   CHOL 179 03/27/2023   CHOL 155 08/28/2022   Lab Results  Component Value Date   HDL 51 08/13/2023   HDL 54 03/27/2023   HDL 44.80 08/28/2022   Lab Results  Component Value Date   LDLCALC 61 08/13/2023   LDLCALC 105 (H) 03/27/2023   LDLCALC 85 08/28/2022   Lab Results  Component Value Date   TRIG 149 08/13/2023   TRIG 114 03/27/2023   TRIG 127.0 08/28/2022   Lab Results  Component Value Date   CHOLHDL 2.7 08/13/2023   CHOLHDL 3.3 03/27/2023   CHOLHDL 3 08/28/2022   Lab Results  Component Value Date   LDLDIRECT 131.0 03/18/2019   LDLDIRECT 133.0 07/15/2017   LDLDIRECT 173.7 03/17/2013       Radiology: No results found.  EKG: 03/25/19 SR normal ECG 08/29/2023 SR rate 67 normal    ASSESSMENT AND PLAN:   1. COVID:  Improved CT 01/11/20 with resolution of interstitial changes  Sed Rate and C reactive protein normal November 10 2019   2. Tachycardia:  Improved ? Related to anxiety and COVID ECG normal 48 hr monitor never done will defer at this point   3. CAD: Non obstructive on cardiac CTA  10/04/22 CADRADS 2 calcium  score elevated for age with increased TPV. ASA And statin see below  4. HTN: ? Micardis  contributing to postural symptoms labs with primary pending including TSH/cortisol tick serology Add norvasc  5 mg daily  5. HLD: on low dose crestor  and repatha  LDL 61 08/13/23 Had muschle aches and brain fog with 10 mg of crestor   6. Thyroid :  On Armour Thyroid  see above dosing per primary TSH normal  7. DM:  Discussed low carb diet.  Target hemoglobin A1c is 6.5 or less.  Continue current medications.   8. Dizziness/Dyspnea:  EF normal see above r/o CAD changed ARB to norvasc  5 mg and seems to help  9.  Dilated Aorta: only 3.8 cm by cardiac CTA 10/04/22    F/U  6 months   Signed: Janelle Mediate 08/29/2023, 9:22 AM

## 2023-08-26 ENCOUNTER — Other Ambulatory Visit (HOSPITAL_COMMUNITY): Payer: Self-pay

## 2023-08-27 ENCOUNTER — Other Ambulatory Visit (HOSPITAL_COMMUNITY): Payer: Self-pay

## 2023-08-27 ENCOUNTER — Encounter (HOSPITAL_BASED_OUTPATIENT_CLINIC_OR_DEPARTMENT_OTHER): Payer: Self-pay

## 2023-08-28 ENCOUNTER — Other Ambulatory Visit: Payer: Self-pay | Admitting: Cardiovascular Disease

## 2023-08-29 ENCOUNTER — Ambulatory Visit: Payer: BC Managed Care – PPO | Attending: Cardiovascular Disease | Admitting: Cardiovascular Disease

## 2023-08-29 VITALS — BP 127/80 | HR 76 | Ht 75.0 in | Wt 249.0 lb

## 2023-08-29 DIAGNOSIS — R931 Abnormal findings on diagnostic imaging of heart and coronary circulation: Secondary | ICD-10-CM

## 2023-08-29 DIAGNOSIS — I10 Essential (primary) hypertension: Secondary | ICD-10-CM | POA: Diagnosis not present

## 2023-08-29 DIAGNOSIS — E782 Mixed hyperlipidemia: Secondary | ICD-10-CM | POA: Diagnosis not present

## 2023-08-29 NOTE — Patient Instructions (Signed)

## 2023-09-11 DIAGNOSIS — G4733 Obstructive sleep apnea (adult) (pediatric): Secondary | ICD-10-CM | POA: Diagnosis not present

## 2023-10-12 DIAGNOSIS — G4733 Obstructive sleep apnea (adult) (pediatric): Secondary | ICD-10-CM | POA: Diagnosis not present

## 2023-11-12 DIAGNOSIS — G4733 Obstructive sleep apnea (adult) (pediatric): Secondary | ICD-10-CM | POA: Diagnosis not present

## 2023-12-01 ENCOUNTER — Other Ambulatory Visit: Payer: Self-pay | Admitting: Internal Medicine

## 2023-12-01 ENCOUNTER — Other Ambulatory Visit (HOSPITAL_COMMUNITY): Payer: Self-pay

## 2024-02-03 ENCOUNTER — Other Ambulatory Visit: Payer: Self-pay | Admitting: Internal Medicine

## 2024-03-01 ENCOUNTER — Other Ambulatory Visit: Payer: Self-pay | Admitting: Internal Medicine

## 2024-03-01 ENCOUNTER — Other Ambulatory Visit: Payer: Self-pay | Admitting: Emergency Medicine

## 2024-03-02 ENCOUNTER — Telehealth: Payer: Self-pay

## 2024-03-02 NOTE — Telephone Encounter (Signed)
 Copied from CRM #[id number]. Topic: Clinical - Medication Question >> Mar 02, 2024  9:34 AM Revonda D wrote: Reason for CRM: Pt is calling to get an update on the refill request for the zolpidem  (AMBIEN ) 10 MG tablet. Pt stated that he requested this medication 5 days ago and is leaving to out of town soon. Pt would like to have this refill approved today and would like a callback with an update.

## 2024-03-02 NOTE — Telephone Encounter (Signed)
 This was done earlier today.   Thanks

## 2024-03-05 ENCOUNTER — Other Ambulatory Visit (HOSPITAL_COMMUNITY): Payer: Self-pay

## 2024-03-10 ENCOUNTER — Other Ambulatory Visit (HOSPITAL_COMMUNITY): Payer: Self-pay

## 2024-04-02 ENCOUNTER — Telehealth: Payer: Self-pay

## 2024-04-02 ENCOUNTER — Telehealth: Payer: Self-pay | Admitting: Internal Medicine

## 2024-04-02 NOTE — Telephone Encounter (Signed)
 Copied from CRM [id number]. Topic: Appointments - Scheduling Inquiry for Clinic >> Apr 02, 2024  3:16 PM Brad Ray wrote: Reason for CRM: Current patient of Dr. Lynwood Rush and would like to transfer to Dr. Glade Hope. States his mother, Oluwatobiloba Martin, is a current patient of Dr. Hope. Please contact for scheduling if allowed.    ----------------------------------------------------------------------- From previous Reason for Contact - Scheduling: Patient/patient representative is calling to schedule an appointment. Refer to attachments for appointment information.

## 2024-04-02 NOTE — Telephone Encounter (Signed)
 Copied from CRM [id number]. Topic: Clinical - Medication Refill >> Apr 02, 2024  3:10 PM Larissa S wrote: Medication: diazepam  (VALIUM ) 5 MG tablet. Requesting a 60 day supply   Has the patient contacted their pharmacy? Yes (Agent: If no, request that the patient contact the pharmacy for the refill. If patient does not wish to contact the pharmacy document the reason why and proceed with request.) (Agent: If yes, when and what did the pharmacy advise?)  This is the patient's preferred pharmacy:  [id number] [place], [place] - [address] [place] [address] Phone: [phone] Fax: [phone]    Is this the correct pharmacy for this prescription? Yes If no, delete pharmacy and type the correct one.   Has the prescription been filled recently? No  Is the patient out of the medication? Yes  Has the patient been seen for an appointment in the last year OR does the patient have an upcoming appointment? No  Can we respond through MyChart? No  Agent: Please be advised that Rx refills may take up to 3 business days. We ask that you follow-up with your pharmacy.

## 2024-04-05 MED ORDER — DIAZEPAM 5 MG PO TABS: 5.0000 mg | ORAL_TABLET | Freq: Two times a day (BID) | ORAL | 1 refills | Status: AC | PRN

## 2024-04-05 NOTE — Telephone Encounter (Signed)
 Pt has been made aware that Dr. Geofm can not take him on as a new pt at this time.

## 2024-04-05 NOTE — Addendum Note (Signed)
 Addended by: NORLEEN LYNWOOD ORN on: 04/05/2024 11:47 AM   Modules accepted: Orders

## 2024-04-05 NOTE — Telephone Encounter (Signed)
 Ok refill is done, but I can only do 30 days at a time due to this being a controlled substance   thanks
